# Patient Record
Sex: Male | Born: 1975 | Race: White | Hispanic: No | Smoking: Never smoker
Health system: Southern US, Community
[De-identification: ages and names within clinical notes are randomized; demographics above are authoritative.]

## PROBLEM LIST (undated history)

## (undated) DIAGNOSIS — J189 Pneumonia, unspecified organism: Secondary | ICD-10-CM

## (undated) DIAGNOSIS — M1990 Unspecified osteoarthritis, unspecified site: Secondary | ICD-10-CM

## (undated) DIAGNOSIS — M545 Low back pain, unspecified: Secondary | ICD-10-CM

## (undated) DIAGNOSIS — G4730 Sleep apnea, unspecified: Secondary | ICD-10-CM

## (undated) DIAGNOSIS — G43909 Migraine, unspecified, not intractable, without status migrainosus: Secondary | ICD-10-CM

## (undated) DIAGNOSIS — F419 Anxiety disorder, unspecified: Secondary | ICD-10-CM

## (undated) DIAGNOSIS — G8929 Other chronic pain: Secondary | ICD-10-CM

## (undated) DIAGNOSIS — Z800 Family history of malignant neoplasm of digestive organs: Secondary | ICD-10-CM

## (undated) DIAGNOSIS — J019 Acute sinusitis, unspecified: Secondary | ICD-10-CM

## (undated) DIAGNOSIS — F32A Depression, unspecified: Secondary | ICD-10-CM

## (undated) DIAGNOSIS — M87059 Idiopathic aseptic necrosis of unspecified femur: Secondary | ICD-10-CM

## (undated) DIAGNOSIS — F988 Other specified behavioral and emotional disorders with onset usually occurring in childhood and adolescence: Secondary | ICD-10-CM

## (undated) DIAGNOSIS — J40 Bronchitis, not specified as acute or chronic: Secondary | ICD-10-CM

## (undated) DIAGNOSIS — K859 Acute pancreatitis without necrosis or infection, unspecified: Secondary | ICD-10-CM

## (undated) DIAGNOSIS — Z8489 Family history of other specified conditions: Secondary | ICD-10-CM

## (undated) DIAGNOSIS — K760 Fatty (change of) liver, not elsewhere classified: Secondary | ICD-10-CM

## (undated) DIAGNOSIS — R51 Headache: Secondary | ICD-10-CM

## (undated) DIAGNOSIS — R0981 Nasal congestion: Secondary | ICD-10-CM

## (undated) DIAGNOSIS — R519 Headache, unspecified: Secondary | ICD-10-CM

## (undated) DIAGNOSIS — E039 Hypothyroidism, unspecified: Secondary | ICD-10-CM

## (undated) DIAGNOSIS — M5442 Lumbago with sciatica, left side: Secondary | ICD-10-CM

## (undated) DIAGNOSIS — R002 Palpitations: Secondary | ICD-10-CM

## (undated) DIAGNOSIS — E781 Pure hyperglyceridemia: Secondary | ICD-10-CM

## (undated) DIAGNOSIS — K921 Melena: Secondary | ICD-10-CM

## (undated) DIAGNOSIS — E785 Hyperlipidemia, unspecified: Secondary | ICD-10-CM

## (undated) DIAGNOSIS — R0683 Snoring: Secondary | ICD-10-CM

## (undated) DIAGNOSIS — F410 Panic disorder [episodic paroxysmal anxiety] without agoraphobia: Secondary | ICD-10-CM

## (undated) DIAGNOSIS — K219 Gastro-esophageal reflux disease without esophagitis: Secondary | ICD-10-CM

## (undated) DIAGNOSIS — M5441 Lumbago with sciatica, right side: Secondary | ICD-10-CM

## (undated) HISTORY — DX: Lumbago with sciatica, right side: M54.41

## (undated) HISTORY — DX: Palpitations: R00.2

## (undated) HISTORY — DX: Melena: K92.1

## (undated) HISTORY — DX: Depression, unspecified: F32.A

## (undated) HISTORY — DX: Acute sinusitis, unspecified: J01.90

## (undated) HISTORY — DX: Hypothyroidism, unspecified: E03.9

## (undated) HISTORY — DX: Fatty (change of) liver, not elsewhere classified: K76.0

## (undated) HISTORY — PX: TONSILLECTOMY AND ADENOIDECTOMY: SUR1326

## (undated) HISTORY — DX: Lumbago with sciatica, left side: M54.42

## (undated) HISTORY — DX: Anxiety disorder, unspecified: F41.9

## (undated) HISTORY — DX: Family history of malignant neoplasm of digestive organs: Z80.0

## (undated) HISTORY — DX: Acute pancreatitis without necrosis or infection, unspecified: K85.90

## (undated) HISTORY — DX: Snoring: R06.83

## (undated) HISTORY — DX: Hyperlipidemia, unspecified: E78.5

## (undated) HISTORY — DX: Bronchitis, not specified as acute or chronic: J40

## (undated) HISTORY — DX: Nasal congestion: R09.81

## (undated) HISTORY — DX: Other specified behavioral and emotional disorders with onset usually occurring in childhood and adolescence: F98.8

---

## 2002-04-24 ENCOUNTER — Emergency Department (HOSPITAL_COMMUNITY): Admission: EM | Admit: 2002-04-24 | Discharge: 2002-04-24 | Payer: Self-pay | Admitting: Emergency Medicine

## 2002-04-24 ENCOUNTER — Encounter: Payer: Self-pay | Admitting: Emergency Medicine

## 2006-04-17 ENCOUNTER — Ambulatory Visit (HOSPITAL_BASED_OUTPATIENT_CLINIC_OR_DEPARTMENT_OTHER): Admission: RE | Admit: 2006-04-17 | Discharge: 2006-04-17 | Payer: Self-pay | Admitting: Otolaryngology

## 2006-04-28 ENCOUNTER — Ambulatory Visit: Payer: Self-pay | Admitting: Internal Medicine

## 2016-04-06 ENCOUNTER — Inpatient Hospital Stay (HOSPITAL_COMMUNITY)
Admission: EM | Admit: 2016-04-06 | Discharge: 2016-04-10 | DRG: 440 | Disposition: A | Discharge status: Home / Self Care | Payer: BLUE CROSS/BLUE SHIELD | Attending: Internal Medicine | Admitting: Internal Medicine

## 2016-04-06 ENCOUNTER — Encounter (HOSPITAL_COMMUNITY): Payer: Self-pay | Admitting: *Deleted

## 2016-04-06 ENCOUNTER — Emergency Department (HOSPITAL_COMMUNITY): Payer: BLUE CROSS/BLUE SHIELD

## 2016-04-06 DIAGNOSIS — R059 Cough, unspecified: Secondary | ICD-10-CM | POA: Diagnosis present

## 2016-04-06 DIAGNOSIS — Z789 Other specified health status: Secondary | ICD-10-CM | POA: Diagnosis not present

## 2016-04-06 DIAGNOSIS — E781 Pure hyperglyceridemia: Secondary | ICD-10-CM | POA: Insufficient documentation

## 2016-04-06 DIAGNOSIS — F1090 Alcohol use, unspecified, uncomplicated: Secondary | ICD-10-CM | POA: Diagnosis present

## 2016-04-06 DIAGNOSIS — R112 Nausea with vomiting, unspecified: Secondary | ICD-10-CM | POA: Diagnosis not present

## 2016-04-06 DIAGNOSIS — K859 Acute pancreatitis without necrosis or infection, unspecified: Principal | ICD-10-CM | POA: Diagnosis present

## 2016-04-06 DIAGNOSIS — R05 Cough: Secondary | ICD-10-CM | POA: Diagnosis present

## 2016-04-06 DIAGNOSIS — K219 Gastro-esophageal reflux disease without esophagitis: Secondary | ICD-10-CM | POA: Diagnosis present

## 2016-04-06 DIAGNOSIS — Z79899 Other long term (current) drug therapy: Secondary | ICD-10-CM

## 2016-04-06 DIAGNOSIS — R7989 Other specified abnormal findings of blood chemistry: Secondary | ICD-10-CM | POA: Diagnosis present

## 2016-04-06 DIAGNOSIS — F410 Panic disorder [episodic paroxysmal anxiety] without agoraphobia: Secondary | ICD-10-CM | POA: Diagnosis not present

## 2016-04-06 DIAGNOSIS — Z7689 Persons encountering health services in other specified circumstances: Secondary | ICD-10-CM

## 2016-04-06 DIAGNOSIS — Z7289 Other problems related to lifestyle: Secondary | ICD-10-CM | POA: Diagnosis present

## 2016-04-06 DIAGNOSIS — F1010 Alcohol abuse, uncomplicated: Secondary | ICD-10-CM | POA: Diagnosis present

## 2016-04-06 DIAGNOSIS — R945 Abnormal results of liver function studies: Secondary | ICD-10-CM | POA: Diagnosis present

## 2016-04-06 HISTORY — DX: Acute pancreatitis without necrosis or infection, unspecified: K85.90

## 2016-04-06 HISTORY — DX: Low back pain: M54.5

## 2016-04-06 HISTORY — DX: Low back pain, unspecified: M54.50

## 2016-04-06 HISTORY — DX: Family history of other specified conditions: Z84.89

## 2016-04-06 HISTORY — DX: Panic disorder (episodic paroxysmal anxiety): F41.0

## 2016-04-06 HISTORY — DX: Gastro-esophageal reflux disease without esophagitis: K21.9

## 2016-04-06 HISTORY — DX: Headache, unspecified: R51.9

## 2016-04-06 HISTORY — DX: Pneumonia, unspecified organism: J18.9

## 2016-04-06 HISTORY — DX: Other chronic pain: G89.29

## 2016-04-06 HISTORY — DX: Pure hyperglyceridemia: E78.1

## 2016-04-06 HISTORY — DX: Headache: R51

## 2016-04-06 HISTORY — DX: Migraine, unspecified, not intractable, without status migrainosus: G43.909

## 2016-04-06 HISTORY — DX: Sleep apnea, unspecified: G47.30

## 2016-04-06 LAB — COMPREHENSIVE METABOLIC PANEL
ALBUMIN: 3.2 g/dL — AB (ref 3.5–5.0)
ALK PHOS: 216 U/L — AB (ref 38–126)
ALT: 157 U/L — ABNORMAL HIGH (ref 17–63)
ANION GAP: 11 (ref 5–15)
AST: 251 U/L — ABNORMAL HIGH (ref 15–41)
BILIRUBIN TOTAL: 1.7 mg/dL — AB (ref 0.3–1.2)
BUN: 11 mg/dL (ref 6–20)
CALCIUM: 8.9 mg/dL (ref 8.9–10.3)
CO2: 27 mmol/L (ref 22–32)
Chloride: 100 mmol/L — ABNORMAL LOW (ref 101–111)
Creatinine, Ser: 1.05 mg/dL (ref 0.61–1.24)
GFR calc Af Amer: >60 mL/min (ref >60)
GLUCOSE: 125 mg/dL — AB (ref 65–99)
POTASSIUM: 4 mmol/L (ref 3.5–5.1)
Sodium: 138 mmol/L (ref 135–145)
TOTAL PROTEIN: 6.6 g/dL (ref 6.5–8.1)

## 2016-04-06 LAB — URINALYSIS, ROUTINE W REFLEX MICROSCOPIC
BILIRUBIN URINE: NEGATIVE
Glucose, UA: NEGATIVE mg/dL
HGB URINE DIPSTICK: NEGATIVE
Ketones, ur: NEGATIVE mg/dL
LEUKOCYTES UA: NEGATIVE
NITRITE: NEGATIVE
PH: 7.5 (ref 5.0–8.0)
Protein, ur: NEGATIVE mg/dL
SPECIFIC GRAVITY, URINE: 1.018 (ref 1.005–1.030)

## 2016-04-06 LAB — CBC
HEMATOCRIT: 40 % (ref 39.0–52.0)
HEMOGLOBIN: 14.8 g/dL (ref 13.0–17.0)
MCH: 33.8 pg (ref 26.0–34.0)
MCHC: 37 g/dL — AB (ref 30.0–36.0)
MCV: 91.3 fL (ref 78.0–100.0)
Platelets: 270 10*3/uL (ref 150–400)
RBC: 4.38 MIL/uL (ref 4.22–5.81)
RDW: 14.8 % (ref 11.5–15.5)
WBC: 11.5 10*3/uL — AB (ref 4.0–10.5)

## 2016-04-06 LAB — LIPID PANEL
Cholesterol: 763 mg/dL — ABNORMAL HIGH (ref 0–200)
LDL CALC: UNDETERMINED mg/dL (ref 0–99)
Triglycerides: 7940 mg/dL — ABNORMAL HIGH (ref <150)
VLDL: UNDETERMINED mg/dL (ref 0–40)

## 2016-04-06 LAB — LIPASE, BLOOD: Lipase: 923 U/L — ABNORMAL HIGH (ref 11–51)

## 2016-04-06 MED ORDER — MORPHINE SULFATE (PF) 4 MG/ML IV SOLN
4.0000 mg | Freq: Once | INTRAVENOUS | Status: AC
  Administered 2016-04-06: 4 mg via INTRAVENOUS
  Filled 2016-04-06: qty 1

## 2016-04-06 MED ORDER — LORAZEPAM 2 MG/ML IJ SOLN
1.0000 mg | Freq: Four times a day (QID) | INTRAMUSCULAR | Status: AC | PRN
  Administered 2016-04-06 – 2016-04-09 (×4): 1 mg via INTRAVENOUS
  Filled 2016-04-06: qty 1

## 2016-04-06 MED ORDER — FOLIC ACID 1 MG PO TABS
1.0000 mg | ORAL_TABLET | Freq: Every day | ORAL | Status: DC
  Administered 2016-04-07 – 2016-04-10 (×4): 1 mg via ORAL
  Filled 2016-04-06 (×4): qty 1

## 2016-04-06 MED ORDER — PANTOPRAZOLE SODIUM 40 MG PO TBEC
40.0000 mg | DELAYED_RELEASE_TABLET | Freq: Every day | ORAL | Status: DC
  Administered 2016-04-07 – 2016-04-10 (×3): 40 mg via ORAL
  Filled 2016-04-06 (×3): qty 1

## 2016-04-06 MED ORDER — SODIUM CHLORIDE 0.9 % IV BOLUS (SEPSIS)
1000.0000 mL | Freq: Once | INTRAVENOUS | Status: DC
Start: 2016-04-06 — End: 2016-04-10

## 2016-04-06 MED ORDER — HYDROMORPHONE HCL 1 MG/ML IJ SOLN
1.0000 mg | Freq: Once | INTRAMUSCULAR | Status: AC
  Administered 2016-04-06: 1 mg via INTRAVENOUS
  Filled 2016-04-06: qty 1

## 2016-04-06 MED ORDER — SODIUM CHLORIDE 0.9 % IV SOLN: INTRAVENOUS | Status: DC

## 2016-04-06 MED ORDER — MORPHINE SULFATE (PF) 2 MG/ML IV SOLN
2.0000 mg | INTRAVENOUS | Status: DC | PRN
  Administered 2016-04-06 – 2016-04-07 (×3): 2 mg via INTRAVENOUS
  Filled 2016-04-06 (×3): qty 1

## 2016-04-06 MED ORDER — ONDANSETRON HCL 4 MG/2ML IJ SOLN
4.0000 mg | Freq: Three times a day (TID) | INTRAMUSCULAR | Status: AC | PRN
  Administered 2016-04-06 – 2016-04-07 (×2): 4 mg via INTRAVENOUS
  Filled 2016-04-06 (×2): qty 2

## 2016-04-06 MED ORDER — ONDANSETRON HCL 4 MG/2ML IJ SOLN
4.0000 mg | Freq: Once | INTRAMUSCULAR | Status: AC
  Administered 2016-04-06: 4 mg via INTRAVENOUS
  Filled 2016-04-06: qty 2

## 2016-04-06 MED ORDER — LORAZEPAM 2 MG/ML IJ SOLN
0.0000 mg | Freq: Four times a day (QID) | INTRAMUSCULAR | Status: AC
  Administered 2016-04-07 – 2016-04-08 (×2): 1 mg via INTRAVENOUS
  Filled 2016-04-06 (×3): qty 1

## 2016-04-06 MED ORDER — ENOXAPARIN SODIUM 40 MG/0.4ML ~~LOC~~ SOLN
40.0000 mg | SUBCUTANEOUS | Status: DC
  Filled 2016-04-06 (×2): qty 0.4

## 2016-04-06 MED ORDER — ADULT MULTIVITAMIN W/MINERALS CH
1.0000 | ORAL_TABLET | Freq: Every day | ORAL | Status: DC
  Administered 2016-04-07 – 2016-04-10 (×4): 1 via ORAL
  Filled 2016-04-06 (×4): qty 1

## 2016-04-06 MED ORDER — VITAMIN B-1 100 MG PO TABS
100.0000 mg | ORAL_TABLET | Freq: Every day | ORAL | Status: DC
  Administered 2016-04-07 – 2016-04-10 (×3): 100 mg via ORAL
  Filled 2016-04-06 (×3): qty 1

## 2016-04-06 MED ORDER — ALBUTEROL SULFATE (2.5 MG/3ML) 0.083% IN NEBU: 2.5000 mg | INHALATION_SOLUTION | Freq: Four times a day (QID) | RESPIRATORY_TRACT | Status: DC | PRN

## 2016-04-06 MED ORDER — LORAZEPAM 1 MG PO TABS
1.0000 mg | ORAL_TABLET | Freq: Four times a day (QID) | ORAL | Status: AC | PRN
  Filled 2016-04-06: qty 1

## 2016-04-06 MED ORDER — ONDANSETRON 4 MG PO TBDP
ORAL_TABLET | ORAL | Status: AC
  Filled 2016-04-06: qty 1

## 2016-04-06 MED ORDER — SODIUM CHLORIDE 0.9 % IV BOLUS (SEPSIS)
1000.0000 mL | Freq: Once | INTRAVENOUS | Status: AC
Start: 2016-04-06 — End: 2016-04-06
  Administered 2016-04-06: 1000 mL via INTRAVENOUS

## 2016-04-06 MED ORDER — HYDROMORPHONE HCL 1 MG/ML IJ SOLN: 1.0000 mg | INTRAMUSCULAR | Status: DC | PRN

## 2016-04-06 MED ORDER — LORAZEPAM 2 MG/ML IJ SOLN
0.0000 mg | Freq: Two times a day (BID) | INTRAMUSCULAR | Status: DC
Start: 2016-04-08 — End: 2016-04-10
  Administered 2016-04-08: 1 mg via INTRAVENOUS
  Filled 2016-04-06: qty 1

## 2016-04-06 MED ORDER — ONDANSETRON 4 MG PO TBDP
4.0000 mg | ORAL_TABLET | Freq: Once | ORAL | Status: AC | PRN
  Administered 2016-04-06: 4 mg via ORAL

## 2016-04-06 MED ORDER — OXYCODONE HCL 5 MG PO TABS
10.0000 mg | ORAL_TABLET | Freq: Four times a day (QID) | ORAL | Status: DC | PRN
  Administered 2016-04-07 – 2016-04-08 (×4): 10 mg via ORAL
  Filled 2016-04-06 (×4): qty 2

## 2016-04-06 MED ORDER — ALPRAZOLAM 0.25 MG PO TABS: 0.2500 mg | ORAL_TABLET | Freq: Every day | ORAL | Status: DC | PRN

## 2016-04-06 MED ORDER — LORATADINE 10 MG PO TABS
10.0000 mg | ORAL_TABLET | Freq: Every day | ORAL | Status: DC
Start: 2016-04-06 — End: 2016-04-10
  Administered 2016-04-07 – 2016-04-10 (×3): 10 mg via ORAL
  Filled 2016-04-06 (×4): qty 1

## 2016-04-06 MED ORDER — THIAMINE HCL 100 MG/ML IJ SOLN: 100.0000 mg | Freq: Every day | INTRAMUSCULAR | Status: DC

## 2016-04-06 MED ORDER — DM-GUAIFENESIN ER 30-600 MG PO TB12
1.0000 | ORAL_TABLET | Freq: Two times a day (BID) | ORAL | Status: DC
  Administered 2016-04-07 – 2016-04-10 (×7): 1 via ORAL
  Filled 2016-04-06 (×7): qty 1

## 2016-04-06 MED ORDER — AMPHETAMINE-DEXTROAMPHETAMINE 10 MG PO TABS
20.0000 mg | ORAL_TABLET | Freq: Three times a day (TID) | ORAL | Status: DC
  Administered 2016-04-07 – 2016-04-10 (×2): 20 mg via ORAL
  Filled 2016-04-06 (×4): qty 2

## 2016-04-06 NOTE — H&P (Addendum)
History and Physical    Connor LaurenceGarrett B Acevedo QIO:[id number] DOB: 1976/03/21 DOA: 04/06/2016  Referring MD/NP/PA:   PCP: Pcp Not In System   Patient coming from:  The patient is coming from home.  At baseline, pt is independent for most of ADL.        Chief Complaint: Nausea, vomiting, abdominal pain, cough  HPI: Connor Martin is a 40 y.o. male with medical history significant of GERD, panic attack, alcohol use, who presents with nausea, vomiting, abdominal pain and cough.  Patient reports that he has been having nausea, vomiting, abdominal pain for more than a week. His abdominal pain is located in the left upper quadrant, intermittent, 10 out of 10 in severity, nonradiating. It is not aggravated or alleviated by any known factors. He vomited 4-5 times today without blood in the vomitus. Patient states that he drinks Vodka 3-4 times per week. Per his girlfriend, patient has several episode of panic attack with headache, generalized weakness, anxiety. He had some chest pain and chest discomfort earlier, which has resolved. Currently no chest pain. Patient has dry cough for more than 2 weeks, no SOB.  Patient does not have fever, but has chills. No diarrhea, symptoms of UTI or unilateral weakness.  ED Course: pt was found to have elevated lipase 923, WBC 11.5, abnormal liver function with total bilirubin 1.7, AST 251, ALT 157, ALP 216, temperature normal, heart rate at 90s, electrolytes and renal function okay. Abdominal ultrasound showed no cholelithiasis or sonographic evidence of acute cholecystitis, increased hepatic echogenicity as can be seen with hepatic steatosis. Pt is placed on med-surg bed for obs.  Review of Systems:   General: no fevers, has chills, no changes in body weight, has poor appetite, has fatigue HEENT: no blurry vision, hearing changes or sore throat Pulm: no dyspnea, has coughing, no wheezing CV: had chest pain, no palpitations Abd: has nausea, vomiting, abdominal  pain, no diarrhea, constipation GU: no dysuria, burning on urination, increased urinary frequency, hematuria  Ext: no leg edema Neuro: no unilateral weakness, numbness, or tingling, no vision change or hearing loss Skin: no rash MSK: No muscle spasm, no deformity, no limitation of range of movement in spin Heme: No easy bruising.  Travel history: No recent long distant travel.  Allergy:  Allergies  Allergen Reactions  . Imitrex [Sumatriptan] Swelling    Brain  . Latex Other (See Comments)    Does not prefer  . Benadryl [Diphenhydramine] Hives and Palpitations    unsure    Past Medical History  Diagnosis Date  . Panic attacks   . GERD (gastroesophageal reflux disease)     Past Surgical History  Procedure Laterality Date  . Tonsillectomy      Social History:  reports that he has never smoked. He does not have any smokeless tobacco history on file. He reports that he drinks alcohol. He reports that he does not use illicit drugs.  Family History:  Family History  Problem Relation Age of Onset  . Hypothyroidism Mother   . Heart disease Mother   . ALS Father      Prior to Admission medications   Medication Sig Start Date End Date Taking? Authorizing Provider  ALPRAZolam Prudy Feeler(XANAX) 0.5 MG tablet Take 0.25 mg by mouth daily as needed for anxiety.   Yes Historical Provider, MD  amphetamine-dextroamphetamine (ADDERALL) 20 MG tablet Take 20 mg by mouth 3 (three) times daily. 02/04/16  Yes Historical Provider, MD  aspirin-acetaminophen-caffeine (EXCEDRIN MIGRAINE) [phone] MG tablet Take 1  tablet by mouth 2 (two) times daily as needed for migraine.    Yes Historical Provider, MD  cetirizine (ZYRTEC) 10 MG tablet Take 10 mg by mouth daily.   Yes Historical Provider, MD  esomeprazole (NEXIUM) 40 MG capsule Take 40 mg by mouth daily. 04/04/16  Yes Historical Provider, MD    Physical Exam: Filed Vitals:   04/06/16 1535 04/06/16 1738 04/06/16 1900 04/06/16 2100  BP: 165/90 137/89  125/90 136/93  Pulse: 99 93 96 98  Temp: 98.7 F (37.1 C) 98.9 F (37.2 C)    TempSrc: Oral Oral    Resp: 24 20 23 16   SpO2: 100% 97% 100% 97%   General: Not in acute distress. Pt is anxious. HEENT:       Eyes: PERRL, EOMI, no scleral icterus.       ENT: No discharge from the ears and nose, no pharynx injection, no tonsillar enlargement.        Neck: No JVD, no bruit, no mass felt. Heme: No neck lymph node enlargement. Cardiac: S1/S2, RRR, No murmurs, No gallops or rubs. Pulm: No rales, wheezing, rhonchi or rubs. Abd: Soft, nondistended, tenderness over LUQ, no rebound pain, no organomegaly, BS present. GU: No hematuria Ext: No pitting leg edema bilaterally. 2+DP/PT pulse bilaterally. Musculoskeletal: No joint deformities, No joint redness or warmth, no limitation of ROM in spin. Skin: No rashes.  Neuro: Alert, oriented X3, cranial nerves II-XII grossly intact, moves all extremities normally.  Psych: Patient is not psychotic, no suicidal or hemocidal ideation.  Labs on Admission: I have personally reviewed following labs and imaging studies  CBC:  Recent Labs Lab 04/06/16 1619  WBC 11.5*  HGB 14.8  HCT 40.0  MCV 91.3  PLT 270   Basic Metabolic Panel:  Recent Labs Lab 04/06/16 1619  NA 138  K 4.0  CL 100*  CO2 27  GLUCOSE 125*  BUN 11  CREATININE 1.05  CALCIUM 8.9   GFR: CrCl cannot be calculated (Unknown ideal weight.). Liver Function Tests:  Recent Labs Lab 04/06/16 1619  AST 251*  ALT 157*  ALKPHOS 216*  BILITOT 1.7*  PROT 6.6  ALBUMIN 3.2*    Recent Labs Lab 04/06/16 1619  LIPASE 923*   No results for input(s): AMMONIA in the last 168 hours. Coagulation Profile: No results for input(s): INR, PROTIME in the last 168 hours. Cardiac Enzymes: No results for input(s): CKTOTAL, CKMB, CKMBINDEX, TROPONINI in the last 168 hours. BNP (last 3 results) No results for input(s): PROBNP in the last 8760 hours. HbA1C: No results for input(s): HGBA1C  in the last 72 hours. CBG: No results for input(s): GLUCAP in the last 168 hours. Lipid Profile: No results for input(s): CHOL, HDL, LDLCALC, TRIG, CHOLHDL, LDLDIRECT in the last 72 hours. Thyroid Function Tests: No results for input(s): TSH, T4TOTAL, FREET4, T3FREE, THYROIDAB in the last 72 hours. Anemia Panel: No results for input(s): VITAMINB12, FOLATE, FERRITIN, TIBC, IRON, RETICCTPCT in the last 72 hours. Urine analysis:    Component Value Date/Time   COLORURINE YELLOW 04/06/2016 1900   APPEARANCEUR CLEAR 04/06/2016 1900   LABSPEC 1.018 04/06/2016 1900   PHURINE 7.5 04/06/2016 1900   GLUCOSEU NEGATIVE 04/06/2016 1900   HGBUR NEGATIVE 04/06/2016 1900   BILIRUBINUR NEGATIVE 04/06/2016 1900   KETONESUR NEGATIVE 04/06/2016 1900   PROTEINUR NEGATIVE 04/06/2016 1900   NITRITE NEGATIVE 04/06/2016 1900   LEUKOCYTESUR NEGATIVE 04/06/2016 1900   Sepsis Labs: @LABRCNTIP (procalcitonin:4,lacticidven:4) )No results found for this or any previous visit (from the past  240 hour(s)).   Radiological Exams on Admission: US Abdomen Limited  04/06/2016  CLINICAL DATA:  Panic attack, chest pain, nausea, vomiting, increased LFTs EXAM: US ABDOMEN LIMITED - RIGHT UPPER QUADRANT COMPARISON:  None. FINDINGS: Gallbladder: No gallstones or wall thickening visualized. No sonographic Murphy sign noted by sonographer. Common bile duct: Diameter: 4 mm Liver: No focal lesion identified. Increased hepatic parenchymal echogenicity. IMPRESSION: 1. No cholelithiasis or sonographic evidence of acute cholecystitis. 2. Increased hepatic echogenicity as can be seen with hepatic steatosis. Electronically Signed   By: Elige Ko   On: 04/06/2016 20:43     EKG: Independently reviewed. Sinus rhythm, QTC 432, no ischemic change.  Assessment/Plan Principal Problem:   Pancreatitis, acute Active Problems:   Panic attacks   GERD (gastroesophageal reflux disease)   Abnormal LFTs   Alcohol use (HCC)    Cough   Pancreatitis, acute: Patient's nausea, vomiting and abdominal pain is most likely caused by acute pancreatitis given elevated lipase. Patient has abnormal liver function, but no gallstone by abdominal ultrasound. This is most likely due to alcoholic pancreatitis. Currently patient is hemodynamically stable.  -will place on med-surg bed for observation -NPO for pancreatitis -IVF: 2LNS and then at 125 cc/hr -IV morphine and oxycodone for pain control, IV zofran for nausea -check lipid panel to rule out triglyceridemia.  Addendum: Lipid profile result showed elevation of triglycerides 7940, which may have contributed to pancreatitis. - change bed to tele due to need of insulin gtt -will start insulin gtt now -change IVF from NS to D10 at 100 cc/h -cbg q1h -BMP q4h -D50 prn for hypoglycemia -repeat lipid profile in morning   GERD (gastroesophageal reflux disease): -continue PPI, protonix  Panic attacks: -continue home Xanax -also on CIWA protocol for alcohol use  Abnormal LFTs: likely due to alcohol use. -hold Excedrin (to avoid tylenol) -check hepatitis panel  Alcohol use: -Did counseling about the importance of quitting drinking -CIWA protocol  Cough: Lung clear.  -check CXR -Mucinex for cough -prn Albuterol Nebs for SOB   DVT ppx: sq Lovenox Code Status: Full code Family Communication: Yes, patient's mother and girlfriend at bed side Disposition Plan:  Anticipate discharge back to previous home environment Consults called: none Admission status: medical floor/obs   Date of Service 04/06/2016    Lorretta Harp [facility] Pager [phone]  If 7PM-7AM, please contact night-coverage [email or web address] Password [facility] 04/06/2016, 9:43 PM

## 2016-04-06 NOTE — ED Notes (Addendum)
pts girlfriend states that pt has been having what they believe to be panic attacks. States that he has been experiencing emesis, headache, weakness. C/o chest pain when he is having severe anxiety. States he takes .25mg  Xanax. States he has been having panic attacks since college but has not had any for awhile. 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

## 2016-04-06 NOTE — ED Provider Notes (Addendum)
CSN: [id number]     Arrival date & time 04/06/16  1516 History   First MD Initiated Contact with Patient 04/06/16 1851     Chief Complaint  Patient presents with  . Emesis     (Consider location/radiation/quality/duration/timing/severity/associated sxs/prior Treatment) HPI Comments: Patient states for the last week he has not felt well. He has had recurrent episodes of vomiting some days worse than others and left upper quadrant pain. Sometimes he will vomit wrist times and others just once or twice. Occasionally he will have blood streaks in his vomitus but not continuously. Symptoms started spontaneously however he did have more to drink on Saturday because he was on medication which seemed to make his symptoms worse. He typically drinks 5 drinks per week. Also after numerous episodes of vomiting he will have a short episode of syncope that his wife for mom have to wake him up from. Today he has not been able to hold down any fluids or food. The pain is not improving and because he was having worsening symptoms he decided to come to be evaluated. He states he is always under a lot of stress but seems to be more anxious than normal. He does have Xanax that he can take for anxiety but hadn't taken any for 3 months until this week. No prior history of gallbladder disease.  Patient is a 40 y.o. male presenting with vomiting. The history is provided by the patient.  Emesis Severity:  Severe Duration:  1 week Timing:  Constant Number of daily episodes:  Multiple Quality:  Stomach contents (Occasionally will have streaks of blood in his vomitus) Progression:  Unchanged Chronicity:  New Recent urination:  Decreased Relieved by:  Nothing Worsened by:  Liquids and food smell Ineffective treatments:  None tried Associated symptoms: abdominal pain and headaches   Associated symptoms: no diarrhea   Associated symptoms comment:  Left upper abdominal pain Risk factors: alcohol use   Risk factors: no  diabetes, no sick contacts and no suspect food intake     Past Medical History  Diagnosis Date  . Panic attacks   . GERD (gastroesophageal reflux disease)    Past Surgical History  Procedure Laterality Date  . Tonsillectomy     No family history on file. Social History  Substance Use Topics  . Smoking status: Never Smoker   . Smokeless tobacco: None  . Alcohol Use: Yes     Comment: social    Review of Systems  Gastrointestinal: Positive for vomiting and abdominal pain. Negative for diarrhea.  Neurological: Positive for headaches.  All other systems reviewed and are negative.     Allergies  Benadryl  Home Medications   Prior to Admission medications   Not on File   BP 125/90 mmHg  Pulse 96  Temp(Src) 98.9 F (37.2 C) (Oral)  Resp 23  SpO2 100% Physical Exam  Constitutional: He is oriented to person, place, and time. He appears well-developed and well-nourished. No distress.  HENT:  Head: Normocephalic and atraumatic.  Mouth/Throat: Oropharynx is clear and moist. Mucous membranes are dry.  Eyes: Conjunctivae and EOM are normal. Pupils are equal, round, and reactive to light.  Neck: Normal range of motion. Neck supple.  Cardiovascular: Normal rate, regular rhythm and intact distal pulses.   No murmur heard. Pulmonary/Chest: Effort normal and breath sounds normal. No respiratory distress. He has no wheezes. He has no rales.  Abdominal: Soft. He exhibits no distension. There is tenderness in the left upper quadrant. There is  no rebound and no guarding.  Musculoskeletal: Normal range of motion. He exhibits no edema or tenderness.  Neurological: He is alert and oriented to person, place, and time.  Skin: Skin is warm and dry. No rash noted. No erythema.  Psychiatric: He has a normal mood and affect. His behavior is normal.  Nursing note and vitals reviewed.   ED Course  Procedures (including critical care time) Labs Review Labs Reviewed  LIPASE, BLOOD -  Abnormal; Notable for the following:    Lipase 923 (*)    All other components within normal limits  COMPREHENSIVE METABOLIC PANEL - Abnormal; Notable for the following:    Chloride 100 (*)    Glucose, Bld 125 (*)    Albumin 3.2 (*)    AST 251 (*)    ALT 157 (*)    Alkaline Phosphatase 216 (*)    Total Bilirubin 1.7 (*)    All other components within normal limits  CBC - Abnormal; Notable for the following:    WBC 11.5 (*)    MCHC 37.0 (*)    All other components within normal limits  URINALYSIS, ROUTINE W REFLEX MICROSCOPIC (NOT AT [facility])    Imaging Review [place] Abdomen Limited  04/06/2016  CLINICAL DATA:  Panic attack, chest pain, nausea, vomiting, increased LFTs EXAM: US ABDOMEN LIMITED - RIGHT UPPER QUADRANT COMPARISON:  None. FINDINGS: Gallbladder: No gallstones or wall thickening visualized. No sonographic Murphy sign noted by sonographer. Common bile duct: Diameter: 4 mm Liver: No focal lesion identified. Increased hepatic parenchymal echogenicity. IMPRESSION: 1. No cholelithiasis or sonographic evidence of acute cholecystitis. 2. Increased hepatic echogenicity as can be seen with hepatic steatosis. Electronically Signed   By: Elige KoHetal  Patel   On: 04/06/2016 20:43   I have personally reviewed and evaluated these images and lab results as part of my medical decision-making.   EKG Interpretation   Date/Time:  Thursday April 06 2016 15:39:25 EDT Ventricular Rate:  99 PR Interval:  132 QRS Duration: 86 QT Interval:  340 QTC Calculation: 436 R Axis:   44 Text Interpretation:  Normal sinus rhythm Normal ECG No previous tracing  Confirmed by Anitra LauthPLUNKETT  MD, Alphonzo LemmingsWHITNEY ([id number]) on 04/06/2016 6:52:16 PM      MDM   Final diagnoses:  Acute pancreatitis, unspecified pancreatitis type   Patient is a 40 year old male presenting today with one week of vomiting, abdominal pain, intermittent syncope. He initially thought that it was related to severe anxiety however symptoms are not improving.  He also has significant left abdominal tenderness. Today patient is found to have pancreatitis with an elevated lipase of 950 as well as elevated LFTs of 157 and 251 (AST/ALT).  Patient denies any right upper quadrant tenderness and no prior history of pancreatitis. He does not take excessive amounts of Tylenol, ibuprofen or aspirin. He does drink 5-6 drinks per week. On Saturday he did have more alcohol than normal because he was at the beach on vacation.   He denies any fever or change in bowel habits.  Vital signs are stable. Right upper quadrant ultrasound ordered as well as IV fluids, pain and nausea medicine. Patient will require admission for pancreatitis.   8:52 PM U/s without gallbladder pathology.  Gwyneth SproutWhitney Karlis Cregg, MD 04/06/16 [id number]  Gwyneth SproutWhitney Jehan Ranganathan, MD 04/06/16 2112

## 2016-04-07 ENCOUNTER — Encounter (HOSPITAL_COMMUNITY): Payer: Self-pay | Admitting: General Practice

## 2016-04-07 ENCOUNTER — Observation Stay (HOSPITAL_COMMUNITY): Payer: BLUE CROSS/BLUE SHIELD

## 2016-04-07 DIAGNOSIS — K859 Acute pancreatitis without necrosis or infection, unspecified: Secondary | ICD-10-CM | POA: Diagnosis present

## 2016-04-07 DIAGNOSIS — Z789 Other specified health status: Secondary | ICD-10-CM | POA: Diagnosis not present

## 2016-04-07 DIAGNOSIS — R05 Cough: Secondary | ICD-10-CM | POA: Diagnosis present

## 2016-04-07 DIAGNOSIS — F410 Panic disorder [episodic paroxysmal anxiety] without agoraphobia: Secondary | ICD-10-CM | POA: Diagnosis present

## 2016-04-07 DIAGNOSIS — K858 Other acute pancreatitis without necrosis or infection: Secondary | ICD-10-CM

## 2016-04-07 DIAGNOSIS — F1010 Alcohol abuse, uncomplicated: Secondary | ICD-10-CM | POA: Diagnosis present

## 2016-04-07 DIAGNOSIS — R7989 Other specified abnormal findings of blood chemistry: Secondary | ICD-10-CM | POA: Diagnosis not present

## 2016-04-07 DIAGNOSIS — R112 Nausea with vomiting, unspecified: Secondary | ICD-10-CM | POA: Diagnosis present

## 2016-04-07 DIAGNOSIS — E781 Pure hyperglyceridemia: Secondary | ICD-10-CM | POA: Diagnosis present

## 2016-04-07 DIAGNOSIS — K219 Gastro-esophageal reflux disease without esophagitis: Secondary | ICD-10-CM | POA: Diagnosis present

## 2016-04-07 DIAGNOSIS — Z79899 Other long term (current) drug therapy: Secondary | ICD-10-CM | POA: Diagnosis not present

## 2016-04-07 LAB — TRIGLYCERIDES: Triglycerides: >5000 mg/dL — ABNORMAL HIGH (ref <150)

## 2016-04-07 LAB — COMPREHENSIVE METABOLIC PANEL
ALT: 128 U/L — AB (ref 17–63)
ANION GAP: 9 (ref 5–15)
AST: 196 U/L — ABNORMAL HIGH (ref 15–41)
Albumin: 2.9 g/dL — ABNORMAL LOW (ref 3.5–5.0)
Alkaline Phosphatase: 190 U/L — ABNORMAL HIGH (ref 38–126)
BUN: 13 mg/dL (ref 6–20)
CO2: 25 mmol/L (ref 22–32)
CREATININE: 1 mg/dL (ref 0.61–1.24)
Calcium: 7.7 mg/dL — ABNORMAL LOW (ref 8.9–10.3)
Chloride: 101 mmol/L (ref 101–111)
Glucose, Bld: 84 mg/dL (ref 65–99)
POTASSIUM: 3.6 mmol/L (ref 3.5–5.1)
SODIUM: 135 mmol/L (ref 135–145)
Total Bilirubin: 1.8 mg/dL — ABNORMAL HIGH (ref 0.3–1.2)
Total Protein: 5.9 g/dL — ABNORMAL LOW (ref 6.5–8.1)

## 2016-04-07 LAB — LIPID PANEL
Cholesterol: 777 mg/dL — ABNORMAL HIGH (ref 0–200)
LDL CALC: UNDETERMINED mg/dL (ref 0–99)
TRIGLYCERIDES: 7630 mg/dL — AB (ref <150)
VLDL: UNDETERMINED mg/dL (ref 0–40)

## 2016-04-07 LAB — CBG MONITORING, ED
Glucose-Capillary: 84 mg/dL (ref 65–99)
Glucose-Capillary: 173 mg/dL — ABNORMAL HIGH (ref 65–99)

## 2016-04-07 LAB — GLUCOSE, CAPILLARY
GLUCOSE-CAPILLARY: 117 mg/dL — AB (ref 65–99)
GLUCOSE-CAPILLARY: 150 mg/dL — AB (ref 65–99)
Glucose-Capillary: 93 mg/dL (ref 65–99)
Glucose-Capillary: 81 mg/dL (ref 65–99)
Glucose-Capillary: 95 mg/dL (ref 65–99)
Glucose-Capillary: 113 mg/dL — ABNORMAL HIGH (ref 65–99)
Glucose-Capillary: 124 mg/dL — ABNORMAL HIGH (ref 65–99)
Glucose-Capillary: 121 mg/dL — ABNORMAL HIGH (ref 65–99)

## 2016-04-07 LAB — BASIC METABOLIC PANEL
ANION GAP: 13 (ref 5–15)
ANION GAP: 12 (ref 5–15)
BUN: 12 mg/dL (ref 6–20)
BUN: 12 mg/dL (ref 6–20)
CHLORIDE: 97 mmol/L — AB (ref 101–111)
CHLORIDE: 98 mmol/L — AB (ref 101–111)
CO2: 23 mmol/L (ref 22–32)
CO2: 24 mmol/L (ref 22–32)
CREATININE: 1.06 mg/dL (ref 0.61–1.24)
Calcium: 7.8 mg/dL — ABNORMAL LOW (ref 8.9–10.3)
Calcium: 7.8 mg/dL — ABNORMAL LOW (ref 8.9–10.3)
Creatinine, Ser: 1.04 mg/dL (ref 0.61–1.24)
GFR calc non Af Amer: >60 mL/min (ref >60)
GFR calc non Af Amer: >60 mL/min (ref >60)
Glucose, Bld: 111 mg/dL — ABNORMAL HIGH (ref 65–99)
Glucose, Bld: 124 mg/dL — ABNORMAL HIGH (ref 65–99)
POTASSIUM: 3.7 mmol/L (ref 3.5–5.1)
Potassium: 3.4 mmol/L — ABNORMAL LOW (ref 3.5–5.1)
Sodium: 133 mmol/L — ABNORMAL LOW (ref 135–145)
Sodium: 134 mmol/L — ABNORMAL LOW (ref 135–145)

## 2016-04-07 LAB — CBC
HCT: 42.3 % (ref 39.0–52.0)
Hemoglobin: 13.8 g/dL (ref 13.0–17.0)
MCH: 31.5 pg (ref 26.0–34.0)
MCHC: 32.6 g/dL (ref 30.0–36.0)
MCV: 96.6 fL (ref 78.0–100.0)
PLATELETS: 162 10*3/uL (ref 150–400)
RBC: 4.38 MIL/uL (ref 4.22–5.81)
RDW: 14.9 % (ref 11.5–15.5)
WBC: 10.2 10*3/uL (ref 4.0–10.5)

## 2016-04-07 MED ORDER — DEXTROSE 50 % IV SOLN: 25.0000 mL | INTRAVENOUS | Status: DC | PRN

## 2016-04-07 MED ORDER — HYDROMORPHONE HCL 1 MG/ML IJ SOLN
1.0000 mg | Freq: Once | INTRAMUSCULAR | Status: AC
  Administered 2016-04-07: 1 mg via INTRAVENOUS
  Filled 2016-04-07: qty 1

## 2016-04-07 MED ORDER — DEXTROSE 50 % IV SOLN: 50.0000 mL | INTRAVENOUS | Status: DC | PRN

## 2016-04-07 MED ORDER — SODIUM CHLORIDE 0.9 % IV SOLN
INTRAVENOUS | Status: DC
  Administered 2016-04-07 – 2016-04-10 (×5): via INTRAVENOUS

## 2016-04-07 MED ORDER — CALCIUM GLUCONATE 10 % IV SOLN
2.0000 g | Freq: Once | INTRAVENOUS | Status: DC
Start: 2016-04-07 — End: 2016-04-07
  Filled 2016-04-07: qty 20

## 2016-04-07 MED ORDER — DEXTROSE-NACL 5-0.45 % IV SOLN
INTRAVENOUS | Status: DC
  Administered 2016-04-07: 125 mL/h via INTRAVENOUS

## 2016-04-07 MED ORDER — ACETAMINOPHEN 325 MG PO TABS: 650.0000 mg | ORAL_TABLET | ORAL | Status: DC | PRN

## 2016-04-07 MED ORDER — CALCIUM GLUCONATE 10 % IV SOLN
4.0000 g | Freq: Once | INTRAVENOUS | Status: AC
  Filled 2016-04-07 (×2): qty 40

## 2016-04-07 MED ORDER — DEXTROSE 10 % IV SOLN
INTRAVENOUS | Status: DC
  Administered 2016-04-07: 07:00:00 via INTRAVENOUS

## 2016-04-07 MED ORDER — MORPHINE SULFATE (PF) 4 MG/ML IV SOLN
4.0000 mg | INTRAVENOUS | Status: DC | PRN
  Administered 2016-04-07 – 2016-04-09 (×10): 4 mg via INTRAVENOUS
  Filled 2016-04-07 (×11): qty 1

## 2016-04-07 MED ORDER — SODIUM CHLORIDE 0.9 % IV SOLN
INTRAVENOUS | Status: DC
  Filled 2016-04-07: qty 2.5

## 2016-04-07 MED ORDER — SODIUM CHLORIDE 0.9 % IV SOLN
Freq: Once | INTRAVENOUS | Status: DC
  Filled 2016-04-07 (×2): qty 200

## 2016-04-07 MED ORDER — DEXTROSE 50 % IV SOLN
INTRAVENOUS | Status: AC
  Filled 2016-04-07: qty 50

## 2016-04-07 MED ORDER — ACD FORMULA A 0.73-2.45-2.2 GM/100ML VI SOLN
500.0000 mL | Status: DC
  Filled 2016-04-07 (×2): qty 500

## 2016-04-07 MED ORDER — HEPARIN SODIUM (PORCINE) 1000 UNIT/ML IJ SOLN
1000.0000 [IU] | Freq: Once | INTRAMUSCULAR | Status: DC
  Filled 2016-04-07: qty 1

## 2016-04-07 MED ORDER — OMEGA-3-ACID ETHYL ESTERS 1 G PO CAPS
1.0000 g | ORAL_CAPSULE | Freq: Two times a day (BID) | ORAL | Status: DC
  Administered 2016-04-07 – 2016-04-10 (×4): 1 g via ORAL
  Filled 2016-04-07 (×4): qty 1

## 2016-04-07 MED ORDER — DEXTROSE 50 % IV SOLN
50.0000 mL | Freq: Once | INTRAVENOUS | Status: AC
  Administered 2016-04-07: 50 mL via INTRAVENOUS

## 2016-04-07 MED ORDER — ONDANSETRON HCL 4 MG/2ML IJ SOLN
4.0000 mg | Freq: Four times a day (QID) | INTRAMUSCULAR | Status: DC | PRN
  Administered 2016-04-07: 4 mg via INTRAVENOUS
  Filled 2016-04-07: qty 2

## 2016-04-07 MED ORDER — GEMFIBROZIL 600 MG PO TABS
600.0000 mg | ORAL_TABLET | Freq: Two times a day (BID) | ORAL | Status: DC
  Administered 2016-04-07 – 2016-04-10 (×5): 600 mg via ORAL
  Filled 2016-04-07 (×7): qty 1

## 2016-04-07 NOTE — Plan of Care (Signed)
Problem: Education: Goal: Knowledge of [facility] Education information/materials will improve Outcome: Progressing Pt educated on new medication. Consult to dietitian ordered for pt. Pt pain constant approx 7/10 all day in mid upper quad. Medicated with 4 mg of morphine Q3 PRN and Roxy 10 mg Q6, at most pain goes down to a five. Family at bedside, informed pt experienced some nausea around 5:15, 4 mg of morphine given to pt. Symptoms relieved. Dr. Gwenlyn PerkingMadera made aware. Will continue to monitor. I called IR to inquire about central line for plasmapherisis, was told it would happen some time tomorrow due to emergency.

## 2016-04-07 NOTE — ED Notes (Signed)
Pt CBG 84 at this time.

## 2016-04-07 NOTE — ED Notes (Signed)
Report attempted 

## 2016-04-07 NOTE — ED Notes (Signed)
CBG 173 at this time.

## 2016-04-07 NOTE — Progress Notes (Addendum)
Nutrition Consult/Brief Note  RD consulted for diet education regarding lowering LDL, cholesterol.  Patient not feeling well upon RD visit >> handouts "Fat Restricted Nutrition Therapy" provided to significant other.  Wt Readings from Last 15 Encounters:  04/07/16 205 lb (92.987 kg)    Body mass index is 26.31 kg/(m^2). Patient meets criteria for Overweight based on current BMI.   Current diet order is NPO. Labs and medications reviewed. RD to attempt to educate patient at later date.   Maureen ChattersKatie Kynzley Dowson, RD, LDN Pager #: [phone] After-Hours Pager #: [phone]

## 2016-04-07 NOTE — Consult Note (Signed)
Reason for Consult: evaluation for possible plasmaphersis for hyeprtriglyceridemia-induced acute pancreatitis. Referring Physician: Regan RakersMadera  Connor Martin is an 40 y.o. male.  HPI: Pt is a 39yo WM with PMH sig for GERD, panic attacks, and a family history of hypertriglyceridemia (on mother's side) who [facility] day h/o worsening abdominal pain associated with N/V.  Workup revealed a markedly elevated lipase at 932 and triglyceride levels of 7900.  He was started on oral statin therapy and his repeat triglyceride level was 7600.  We were asked to evaluate patient for possible plasmapheresis for hypertriglyceridemia-induced acute pancreatitis.  Trend in Creatinine: CREATININE, SER  Date/Time Value Ref Range Status  04/07/2016 12:15 PM 1.04 0.61 - 1.24 mg/dL Final  16/10/960407/14/2017 54:0908:50 AM 1.06 0.61 - 1.24 mg/dL Final  81/19/147807/14/2017 29:5604:35 AM 1.00 0.61 - 1.24 mg/dL Final  21/30/865707/13/2017 84:6904:19 PM 1.05 0.61 - 1.24 mg/dL Final    PMH:   Past Medical History  Diagnosis Date  . Panic attacks   . GERD (gastroesophageal reflux disease)     PSH:   Past Surgical History  Procedure Laterality Date  . Tonsillectomy      Allergies:  Allergies  Allergen Reactions  . Imitrex [Sumatriptan] Swelling    Brain  . Latex Other (See Comments)    Does not prefer  . Benadryl [Diphenhydramine] Hives and Palpitations    unsure    Medications:   Prior to Admission medications   Medication Sig Start Date End Date Taking? Authorizing Provider  ALPRAZolam Prudy Feeler(XANAX) 0.5 MG tablet Take 0.25 mg by mouth daily as needed for anxiety.   Yes Historical Provider, MD  amphetamine-dextroamphetamine (ADDERALL) 20 MG tablet Take 20 mg by mouth 3 (three) times daily. 02/04/16  Yes Historical Provider, MD  aspirin-acetaminophen-caffeine (EXCEDRIN MIGRAINE) [phone] MG tablet Take 1 tablet by mouth 2 (two) times daily as needed for migraine.    Yes Historical Provider, MD  cetirizine (ZYRTEC) 10 MG tablet Take 10  mg by mouth daily.   Yes Historical Provider, MD  esomeprazole (NEXIUM) 40 MG capsule Take 40 mg by mouth daily. 04/04/16  Yes Historical Provider, MD    Inpatient medications: . amphetamine-dextroamphetamine  20 mg Oral TID  . dextromethorphan-guaiFENesin  1 tablet Oral BID  . enoxaparin (LOVENOX) injection  40 mg Subcutaneous Q24H  . folic acid  1 mg Oral Daily  . gemfibrozil  600 mg Oral BID AC  . loratadine  10 mg Oral Daily  . LORazepam  0-4 mg Intravenous Q6H   Followed by  . [START ON 04/08/2016] LORazepam  0-4 mg Intravenous Q12H  . multivitamin with minerals  1 tablet Oral Daily  . omega-3 acid ethyl esters  1 g Oral BID  . pantoprazole  40 mg Oral Daily  . sodium chloride  1,000 mL Intravenous Once  . thiamine  100 mg Oral Daily   Or  . thiamine  100 mg Intravenous Daily    Discontinued Meds:   Medications Discontinued During This Encounter  Medication Reason  . HYDROcodone-acetaminophen (NORCO) 7.5-325 MG tablet Completed Course  . HYDROmorphone (DILAUDID) injection 1 mg   . 0.9 %  sodium chloride infusion   . dextrose 50 % solution 25 mL   . dextrose 5 %-0.45 % sodium chloride infusion   . dextrose 10 % infusion   . dextrose 50 % solution 50 mL   . insulin regular (NOVOLIN R,HUMULIN R) 250 Units in sodium chloride 0.9 % 250 mL (1 Units/mL) infusion   .  morphine 2 MG/ML injection 2 mg     Social History:  reports that he has never smoked. He does not have any smokeless tobacco history on file. He reports that he drinks alcohol. He reports that he does not use illicit drugs.  Family History:   Family History  Problem Relation Age of Onset  . Hypothyroidism Mother   . Heart disease Mother   . ALS Father     Pertinent items are noted in HPI. Weight change:   Intake/Output Summary (Last 24 hours) at 04/07/16 1536 Last data filed at 04/07/16 1437  Gross per 24 hour  Intake 811.67 ml  Output      0 ml  Net 811.67 ml   BP 147/97 mmHg  Pulse 83  Temp(Src)  98.4 F (36.9 C) (Oral)  Resp 17  Ht 6\' 2"  (1.88 m)  Wt 92.987 kg (205 lb)  BMI 26.31 kg/m2  SpO2 98% Filed Vitals:   04/07/16 0245 04/07/16 0404 04/07/16 0747 04/07/16 1230  BP: 142/98 149/96 147/97   Pulse: 86  87 83  Temp:  98.3 F (36.8 C) 98.1 F (36.7 C) 98.4 F (36.9 C)  TempSrc:  Oral Oral Oral  Resp: 13 15 14 17   Height:  6\' 2"  (1.88 m)    Weight:  92.987 kg (205 lb)    SpO2: 100% 94% 98%      General appearance: alert, cooperative and mild distress Head: Normocephalic, without obvious abnormality, atraumatic Eyes: negative findings: lids and lashes normal, conjunctivae and sclerae normal and corneas clear Resp: clear to auscultation bilaterally Cardio: regular rate and rhythm, S1, S2 normal, no murmur, click, rub or gallop GI: hypoactive bowel sounds, +tenderness to palpation, no guarding or rebound Extremities: extremities normal, atraumatic, no cyanosis or edema  Labs: Basic Metabolic Panel:  Recent Labs Lab 04/06/16 1619 04/07/16 0435 04/07/16 0850 04/07/16 1215  NA 138 135 133* 134*  K 4.0 3.6 3.4* 3.7  CL 100* 101 97* 98*  CO2 27 25 23 24   GLUCOSE 125* 84 111* 124*  BUN 11 13 12 12   CREATININE 1.05 1.00 1.06 1.04  ALBUMIN 3.2* 2.9*  --   --   CALCIUM 8.9 7.7* 7.8* 7.8*   Liver Function Tests:  Recent Labs Lab 04/06/16 1619 04/07/16 0435  AST 251* 196*  ALT 157* 128*  ALKPHOS 216* 190*  BILITOT 1.7* 1.8*  PROT 6.6 5.9*  ALBUMIN 3.2* 2.9*    Recent Labs Lab 04/06/16 1619  LIPASE 923*   No results for input(s): AMMONIA in the last 168 hours. CBC:  Recent Labs Lab 04/06/16 1619 04/07/16 0435  WBC 11.5* 10.2  HGB 14.8 13.8  HCT 40.0 42.3  MCV 91.3 96.6  PLT 270 162   PT/INR: @LABRCNTIP (inr:5) Cardiac Enzymes: )No results for input(s): CKTOTAL, CKMB, CKMBINDEX, TROPONINI in the last 168 hours. CBG:  Recent Labs Lab 04/07/16 0532 04/07/16 0624 04/07/16 0735 04/07/16 0904 04/07/16 1112  GLUCAP 95 81 113* 117* 150*     Iron Studies: No results for input(s): IRON, TIBC, TRANSFERRIN, FERRITIN in the last 168 hours.  Xrays/Other Studies: Dg Chest 2 View  04/07/2016  CLINICAL DATA:  Cough, left lower chest pain EXAM: CHEST  2 VIEW COMPARISON:  None. FINDINGS: Lungs are clear.  No pleural effusion or pneumothorax. The heart is normal in size. Visualized osseous structures are within normal limits. IMPRESSION: Normal chest radiographs. Electronically Signed   By: Charline Bills M.D.   On: 04/07/2016 09:04   US Abdomen Limited  04/06/2016  CLINICAL DATA:  Panic attack, chest pain, nausea, vomiting, increased LFTs EXAM: US ABDOMEN LIMITED - RIGHT UPPER QUADRANT COMPARISON:  None. FINDINGS: Gallbladder: No gallstones or wall thickening visualized. No sonographic Murphy sign noted by sonographer. Common bile duct: Diameter: 4 mm Liver: No focal lesion identified. Increased hepatic parenchymal echogenicity. IMPRESSION: 1. No cholelithiasis or sonographic evidence of acute cholecystitis. 2. Increased hepatic echogenicity as can be seen with hepatic steatosis. Electronically Signed   By: Elige Ko   On: 04/06/2016 20:43     Assessment/Plan: 1.  hypertriglyceridemia-induced acute pancreatitis.  Not responding to current therapy.  Discussed case with Dr. Gwenlyn Perking as well as Connor Martin and his mother and explained that given his markedly elevated triglycerides and lipase, as well as hypocalcemia, he would benefit from initiation of plasmapheresis to help reduce his hypertriglyceridemia and hopefully prevent further pancreatic injury and complications.  He is discussing this with his mother and wants to hold off for now but will discuss HD catheter placement with PCCM and decide at that time.   1. Would use albumin as replacement as studies have shown 61% reduction of triglyceride levels with this modality 2. Discussed that he may need more than one session depending upon triglyceride response 3. Will use citrate as  anticoagulant 2. HTN- stable 3. Hypocalcemia- due to acute pancreatitis. 4. Abnormal LFT's- presumably due to pancreatitis 5. Oliguria- due to volume depletion/third spacing from acute pancreatitis (severe) 6. Hypoalbuminemia    Irena Cords 04/07/2016, 3:36 PM

## 2016-04-07 NOTE — Progress Notes (Signed)
[facility] PROGRESS NOTE  Connor Martin:[id number] DOB: 10/28/1975 DOA: 04/06/2016 PCP: Pcp Not In System  Interim summary and HPI 40 y.o. male with medical history significant of GERD, panic attack and alcohol use, who presented with nausea, vomiting, abdominal pain and cough. Patient reports that he has been having nausea, vomiting, abdominal pain for more than a week. His abdominal pain is located in the left upper quadrant, intermittent, 10 out of 10 in severity, nonradiating. It is not aggravated or alleviated by any known factors. He vomited 4-5 times today without blood in the vomitus. Patient states that he drinks Vodka 3-4 times per week. Per his girlfriend, patient has several episode of panic attack with headache, generalized weakness, anxiety. He had some chest pain and chest discomfort earlier, which has resolved. Currently no chest pain. Patient has dry cough for more than 2 weeks, no SOB. Patient does not have fever, but has chills. No diarrhea, symptoms of UTI or unilateral weakness.  Assessment/Plan: Pancreatitis, acute: Patient's nausea, vomiting and abdominal pain is most likely caused by acute pancreatitis given elevated lipase. Patient has abnormal liver function, but no gallstone by abdominal ultrasound. This is most likely due to alcoholic pancreatitis and hypertriglyceridemia .  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 -perfect split between AST/ALT -hepatitis panel ordered on admission, will follow rec's  Alcohol use: -continue CIWA protocol -cessation counseling provided  Cough: Lungs clear on exam  -CXR w/o acute abnormalities -will continue mucinex and flutter valve   Code Status: Full Family Communication: girlfriend at baseline  Disposition Plan: to be determine; continue treatment inside the hospital for now. Will maximize IVF's, analgesics and antiemetics. Will also start gemfibrozil and lovaza   Consultants:  Nephrology (Dr. Abel Presto)  Procedures:  See below for x-ray reports   Antibiotics:  None   HPI/Subjective: Afebrile, no CP or SOB. Continue experiencing abd pain. Patient w/o vomiting since admission   Objective: Filed Vitals:   04/07/16 0747 04/07/16 1230  BP: 147/97   Pulse: 87 83  Temp: 98.1 F (36.7 C) 98.4 F (36.9 C)  Resp: 14 17    Intake/Output Summary (Last 24 hours) at 04/07/16 1414 Last data filed at 04/07/16 1300  Gross per 24 hour  Intake    650 ml  Output      0 ml  Net    650 ml   Filed Weights   04/07/16 0215 04/07/16 0404  Weight: 92.987 kg (205 lb) 92.987 kg (205 lb)    Exam:   General: afebrile, continue complaining of pain, no vomiting since admission.   Cardiovascular: S1 and S2, no rubs or gallops  Respiratory: good air movement, no wheezing, no crackles  Abdomen: soft, mild guarding (RUQ and epigastric region); no distension, positive BS  Musculoskeletal: no edema or cyanosis   Data Reviewed: Basic Metabolic Panel:  Recent Labs Lab 04/06/16 1619 04/07/16 0435 04/07/16 0850  NA 138 135 133*  K 4.0 3.6 3.4*  CL 100* 101 97*  CO2 27 25 23   GLUCOSE 125* 84 111*  BUN 11 13 12   CREATININE 1.05 1.00 1.06  CALCIUM 8.9 7.7* 7.8*   Liver Function Tests:  Recent Labs Lab 04/06/16 1619 04/07/16 0435  AST 251* 196*  ALT 157*  128*  ALKPHOS 216* 190*  BILITOT 1.7* 1.8*  PROT 6.6 5.9*  ALBUMIN 3.2* 2.9*    Recent Labs Lab 04/06/16 1619  LIPASE 923*   CBC:  Recent Labs Lab 04/06/16 1619 04/07/16 0435  WBC 11.5* 10.2  HGB 14.8 13.8  HCT 40.0 42.3  MCV 91.3 96.6  PLT 270 162   CBG:  Recent Labs Lab 04/07/16 0532 04/07/16 0624 04/07/16 0735 04/07/16 0904 04/07/16 1112  GLUCAP 95 81 113* 117* 150*    Studies: Dg Chest 2 View  04/07/2016  CLINICAL DATA:  Cough, left lower chest pain EXAM: CHEST  2 VIEW COMPARISON:  None. FINDINGS: Lungs are clear.  No pleural effusion or pneumothorax. The heart is normal in size. Visualized osseous structures are within normal limits. IMPRESSION: Normal chest radiographs. Electronically Signed   By: Charline BillsSriyesh  Krishnan M.D.   On: 04/07/2016 09:04   [place] Abdomen Limited  04/06/2016  CLINICAL DATA:  Panic attack, chest pain, nausea, vomiting, increased LFTs EXAM: US ABDOMEN LIMITED - RIGHT UPPER QUADRANT COMPARISON:  None. FINDINGS: Gallbladder: No gallstones or wall thickening visualized. No sonographic Murphy sign noted by sonographer. Common bile duct: Diameter: 4 mm Liver: No focal lesion identified. Increased hepatic parenchymal echogenicity. IMPRESSION: 1. No cholelithiasis or sonographic evidence of acute cholecystitis. 2. Increased hepatic echogenicity as can be seen with hepatic steatosis. Electronically Signed   By: Elige KoHetal  Patel   On: 04/06/2016 20:43    Scheduled Meds: . amphetamine-dextroamphetamine  20 mg Oral TID  . dextromethorphan-guaiFENesin  1 tablet Oral BID  . dextrose      . enoxaparin (LOVENOX) injection  40 mg Subcutaneous Q24H  . folic acid  1 mg Oral Daily  . gemfibrozil  600 mg Oral BID AC  . loratadine  10 mg Oral Daily  . LORazepam  0-4 mg Intravenous Q6H   Followed by  . [START ON 04/08/2016] LORazepam  0-4 mg Intravenous Q12H  . multivitamin with minerals  1 tablet Oral Daily  . omega-3 acid ethyl esters  1 g Oral BID  . pantoprazole   40 mg Oral Daily  . sodium chloride  1,000 mL Intravenous Once  . thiamine  100 mg Oral Daily   Or  . thiamine  100 mg Intravenous Daily   Continuous Infusions: . sodium chloride      Principal Problem:   Pancreatitis, acute Active Problems:   Panic attacks   GERD (gastroesophageal reflux disease)   Abnormal LFTs   Alcohol use (HCC)   Cough   Pancreatitis    Time spent: 35 minutes (>50% dedicated to face to face examination, family bedside update and discussion with other specialist)    Vassie LollMadera, Domanick Cuccia  [facility] Pager [phone]. If 7PM-7AM, please contact night-coverage at [email or web address], password [email or web address] 04/07/2016, 2:14 PM

## 2016-04-08 ENCOUNTER — Inpatient Hospital Stay (HOSPITAL_COMMUNITY): Payer: BLUE CROSS/BLUE SHIELD

## 2016-04-08 LAB — COMPREHENSIVE METABOLIC PANEL
ALBUMIN: 2.6 g/dL — AB (ref 3.5–5.0)
ALK PHOS: 197 U/L — AB (ref 38–126)
ALT: 96 U/L — ABNORMAL HIGH (ref 17–63)
ANION GAP: 13 (ref 5–15)
AST: 129 U/L — ABNORMAL HIGH (ref 15–41)
BUN: 5 mg/dL — ABNORMAL LOW (ref 6–20)
CALCIUM: 7.8 mg/dL — AB (ref 8.9–10.3)
CHLORIDE: 98 mmol/L — AB (ref 101–111)
CO2: 23 mmol/L (ref 22–32)
Creatinine, Ser: 0.97 mg/dL (ref 0.61–1.24)
GFR calc non Af Amer: >60 mL/min (ref >60)
GLUCOSE: 109 mg/dL — AB (ref 65–99)
POTASSIUM: 4.2 mmol/L (ref 3.5–5.1)
SODIUM: 134 mmol/L — AB (ref 135–145)
Total Bilirubin: 3 mg/dL — ABNORMAL HIGH (ref 0.3–1.2)
Total Protein: 5.5 g/dL — ABNORMAL LOW (ref 6.5–8.1)

## 2016-04-08 LAB — LIPASE, BLOOD: Lipase: 154 U/L — ABNORMAL HIGH (ref 11–51)

## 2016-04-08 LAB — HEPATITIS PANEL, ACUTE
HCV Ab: <0.1 s/co ratio (ref 0.0–0.9)
HEP B C IGM: NEGATIVE
Hep A IgM: NEGATIVE
Hepatitis B Surface Ag: NEGATIVE

## 2016-04-08 LAB — GLUCOSE, CAPILLARY
GLUCOSE-CAPILLARY: 107 mg/dL — AB (ref 65–99)
Glucose-Capillary: 122 mg/dL — ABNORMAL HIGH (ref 65–99)
Glucose-Capillary: 131 mg/dL — ABNORMAL HIGH (ref 65–99)

## 2016-04-08 LAB — HEMOGLOBIN A1C
Hgb A1c MFr Bld: 5.4 % (ref 4.8–5.6)
Mean Plasma Glucose: 108 mg/dL

## 2016-04-08 MED ORDER — ACD FORMULA A 0.73-2.45-2.2 GM/100ML VI SOLN
Status: AC
  Administered 2016-04-08: 500 mL
  Filled 2016-04-08: qty 500

## 2016-04-08 MED ORDER — LIDOCAINE HCL 1 % IJ SOLN
INTRAMUSCULAR | Status: AC
  Filled 2016-04-08: qty 20

## 2016-04-08 MED ORDER — HEPARIN SODIUM (PORCINE) 1000 UNIT/ML IJ SOLN
INTRAMUSCULAR | Status: AC
  Filled 2016-04-08: qty 1

## 2016-04-08 MED ORDER — MORPHINE SULFATE (PF) 4 MG/ML IV SOLN
INTRAVENOUS | Status: AC
  Administered 2016-04-08: 4 mg via INTRAVENOUS
  Filled 2016-04-08: qty 1

## 2016-04-08 MED ORDER — SODIUM CHLORIDE 0.9 % IV SOLN
Freq: Once | INTRAVENOUS | Status: DC
  Filled 2016-04-08 (×5): qty 200

## 2016-04-08 MED ORDER — MORPHINE SULFATE (PF) 4 MG/ML IV SOLN
INTRAVENOUS | Status: AC
  Filled 2016-04-08: qty 1

## 2016-04-08 MED ORDER — MORPHINE SULFATE (PF) 2 MG/ML IV SOLN
2.0000 mg | Freq: Once | INTRAVENOUS | Status: AC
  Administered 2016-04-08: 2 mg via INTRAVENOUS

## 2016-04-08 MED ORDER — CALCIUM GLUCONATE 10 % IV SOLN
4.0000 g | INTRAVENOUS | Status: AC
  Administered 2016-04-08: 4 g via INTRAVENOUS
  Filled 2016-04-08: qty 40

## 2016-04-08 NOTE — Progress Notes (Signed)
[facility] PROGRESS NOTE  Connor Martin IOM:[id number] DOB: 1976-05-26 DOA: 04/06/2016 PCP: Pcp Not In System  Interim summary and HPI 40 y.o. male with medical history significant of GERD, panic attack and alcohol use, who presented with nausea, vomiting, abdominal pain and cough. Patient reports that he has been having nausea, vomiting, abdominal pain for more than a week. His abdominal pain is located in the left upper quadrant, intermittent, 10 out of 10 in severity, nonradiating. It is not aggravated or alleviated by any known factors. He vomited 4-5 times today without blood in the vomitus. Patient states that he drinks Vodka 3-4 times per week. Per his girlfriend, patient has several episode of panic attack with headache, generalized weakness, anxiety. He had some chest pain and chest discomfort earlier, which has resolved. Currently no chest pain. Patient has dry cough for more than 2 weeks, no SOB. Patient does not have fever, but has chills. No diarrhea, symptoms of UTI or unilateral weakness.  Assessment/Plan: Pancreatitis, acute: Patient's nausea, vomiting and abdominal pain is most likely caused by acute pancreatitis given elevated lipase. Patient has abnormal liver function, but no gallstone by abdominal ultrasound. This is most likely due to alcoholic pancreatitis and hypertriglyceridemia .  -lipase level is > 3 times normal range, he has hypocalcemia and his TG are 7630 -failed insulin therapy; as CBG's never high enough consistently to run drip properly. 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 -hepatitis panel ordered on admission, will follow results -LFT's trending down  Alcohol use: -continue CIWA protocol -cessation counseling provided  Cough: Lungs clear on exam  -CXR w/o acute abnormalities -will continue mucinex and flutter valve   Code Status: Full Family Communication: girlfriend at baseline  Disposition Plan: to be determine; continue treatment inside the hospital for now. Will maximize IVF's, analgesics and antiemetics. Will also continue gemfibrozil and lovaza. Plan is to have plasmapheresis today 7/15   Consultants:  Nephrology (Dr. Arty Baumgartner)  Procedures:  See below for x-ray reports   Plasmapheresis    Antibiotics:  None   HPI/Subjective: Afebrile, no CP or SOB reported. Continue experiencing abd pain and nausea. Patient w/o vomiting since admission. S/P HD catheter placement w/o complication.   Objective: Filed Vitals:   04/08/16 1029 04/08/16 1123  BP: 137/94 128/81  Pulse: 96 95  Temp: 98.8 F (37.1 C) 97.5 F (36.4 C)  Resp: 18 18    Intake/Output Summary (Last 24 hours) at 04/08/16 1616 Last data filed at 04/08/16 1400  Gross per 24 hour  Intake 2192.08 ml  Output      0 ml  Net 2192.08 ml   Filed Weights   04/07/16 0215 04/07/16 0404  Weight: 92.987 kg (205 lb) 92.987 kg (205 lb)    Exam:   General: afebrile, continue complaining of abd pain and nausea, no vomiting since admission.   Cardiovascular: S1 and S2, no rubs or gallops  Respiratory: good air movement, no wheezing, no crackles  Abdomen: soft, mild guarding (RUQ and epigastric region); no distension, positive BS  Musculoskeletal: no edema or cyanosis  Data Reviewed: Basic Metabolic Panel:  Recent Labs Lab 04/06/16 1619 04/07/16 0435 04/07/16 0850 04/07/16 1215 04/08/16 0637  NA 138 135 133* 134* 134*  K 4.0 3.6  3.4* 3.7 4.2  CL 100* 101 97* 98* 98*  CO2 27 25 23 24 23   GLUCOSE 125* 84 111* 124* 109*  BUN 11 13 12 12  5*  CREATININE 1.05 1.00 1.06 1.04 0.97  CALCIUM 8.9 7.7* 7.8* 7.8* 7.8*   Liver Function Tests:  Recent Labs Lab 04/06/16 1619 04/07/16 0435 04/08/16 0637  AST 251* 196* 129*  ALT 157* 128* 96*  ALKPHOS 216* 190* 197*  BILITOT 1.7* 1.8* 3.0*  PROT 6.6 5.9* 5.5*  ALBUMIN 3.2* 2.9* 2.6*    Recent Labs Lab 04/06/16 1619 04/08/16 0637  LIPASE 923* 154*   CBC:  Recent Labs Lab 04/06/16 1619 04/07/16 0435  WBC 11.5* 10.2  HGB 14.8 13.8  HCT 40.0 42.3  MCV 91.3 96.6  PLT 270 162   CBG:  Recent Labs Lab 04/07/16 1112 04/07/16 1626 04/07/16 2016 04/08/16 0806 04/08/16 1122  GLUCAP 150* 124* 121* 122* 131*    Studies: Dg Chest 2 View  04/07/2016  CLINICAL DATA:  Cough, left lower chest pain EXAM: CHEST  2 VIEW COMPARISON:  None. FINDINGS: Lungs are clear.  No pleural effusion or pneumothorax. The heart is normal in size. Visualized osseous structures are within normal limits. IMPRESSION: Normal chest radiographs. Electronically Signed   By: Julian Hy M.D.   On: 04/07/2016 09:04   US Abdomen Limited  04/06/2016  CLINICAL DATA:  Panic attack, chest pain, nausea, vomiting, increased LFTs EXAM: US ABDOMEN LIMITED - RIGHT UPPER QUADRANT COMPARISON:  None. FINDINGS: Gallbladder: No gallstones or wall thickening visualized. No sonographic Murphy sign noted by sonographer. Common bile duct: Diameter: 4 mm Liver: No focal lesion identified. Increased hepatic parenchymal echogenicity. IMPRESSION: 1. No cholelithiasis or sonographic evidence of acute cholecystitis. 2. Increased hepatic echogenicity as can be seen with hepatic steatosis. Electronically Signed   By: Kathreen Devoid   On: 04/06/2016 20:43   Ir Fluoro Guide Cv Line Right  04/08/2016  INDICATION: Concern for triglyceride related pancreatitis. Please place temporary dialysis catheter for the initiation of  plasmapheresis. EXAM: NON-TUNNELED CENTRAL VENOUS HEMODIALYSIS CATHETER PLACEMENT WITH ULTRASOUND AND FLUOROSCOPIC GUIDANCE COMPARISON:  None. MEDICATIONS: None FLUOROSCOPY TIME:  42 seconds (5 mGy) COMPLICATIONS: None immediate. PROCEDURE: Informed written consent was obtained from the patient after a discussion of the risks, benefits, and alternatives to treatment. Questions regarding the procedure were encouraged and answered. The right neck and chest were prepped with chlorhexidine in a sterile fashion, and a sterile drape was applied covering the operative field. Maximum barrier sterile technique with sterile gowns and gloves were used for the procedure. A timeout was performed prior to the initiation of the procedure. After creating a small venotomy incision, a micropuncture kit was utilized to access the right internal jugular vein under direct, real-time ultrasound guidance after the overlying soft tissues were anesthetized with 1% lidocaine with epinephrine. Ultrasound image documentation was performed. The microwire was kinked to measure appropriate catheter length. A stiff glidewire was advanced to the level of the IVC. Under fluoroscopic guidance, the venotomy was serially dilated, ultimately allowing placement of a 20 cm temporary Mahurkar catheter with tip ultimately terminating within the superior aspect of the right atrium. Final catheter positioning was confirmed and documented with a spot radiographic image. The catheter aspirates and flushes normally. The catheter was flushed with appropriate volume heparin dwells.  The catheter exit site was secured with a 0-Prolene retention suture. A dressing was placed. The patient tolerated the procedure well without immediate post procedural complication. IMPRESSION: Successful placement of a right internal jugular approach 20 cm temporary dialysis catheter with tip terminating with in the superior aspect of the right atrium. The catheter is ready for  immediate use. PLAN: This catheter may be converted to a tunneled dialysis catheter at a later date as indicated. Electronically Signed   By: Sandi Mariscal M.D.   On: 04/08/2016 11:10   Ir US Guide Vasc Access Right  04/08/2016  INDICATION: Concern for triglyceride related pancreatitis. Please place temporary dialysis catheter for the initiation of plasmapheresis. EXAM: NON-TUNNELED CENTRAL VENOUS HEMODIALYSIS CATHETER PLACEMENT WITH ULTRASOUND AND FLUOROSCOPIC GUIDANCE COMPARISON:  None. MEDICATIONS: None FLUOROSCOPY TIME:  42 seconds (5 mGy) COMPLICATIONS: None immediate. PROCEDURE: Informed written consent was obtained from the patient after a discussion of the risks, benefits, and alternatives to treatment. Questions regarding the procedure were encouraged and answered. The right neck and chest were prepped with chlorhexidine in a sterile fashion, and a sterile drape was applied covering the operative field. Maximum barrier sterile technique with sterile gowns and gloves were used for the procedure. A timeout was performed prior to the initiation of the procedure. After creating a small venotomy incision, a micropuncture kit was utilized to access the right internal jugular vein under direct, real-time ultrasound guidance after the overlying soft tissues were anesthetized with 1% lidocaine with epinephrine. Ultrasound image documentation was performed. The microwire was kinked to measure appropriate catheter length. A stiff glidewire was advanced to the level of the IVC. Under fluoroscopic guidance, the venotomy was serially dilated, ultimately allowing placement of a 20 cm temporary Mahurkar catheter with tip ultimately terminating within the superior aspect of the right atrium. Final catheter positioning was confirmed and documented with a spot radiographic image. The catheter aspirates and flushes normally. The catheter was flushed with appropriate volume heparin dwells. The catheter exit site was secured  with a 0-Prolene retention suture. A dressing was placed. The patient tolerated the procedure well without immediate post procedural complication. IMPRESSION: Successful placement of a right internal jugular approach 20 cm temporary dialysis catheter with tip terminating with in the superior aspect of the right atrium. The catheter is ready for immediate use. PLAN: This catheter may be converted to a tunneled dialysis catheter at a later date as indicated. Electronically Signed   By: Sandi Mariscal M.D.   On: 04/08/2016 11:10    Scheduled Meds: . therapeutic plasma exchange solution   Dialysis Once in dialysis  . amphetamine-dextroamphetamine  20 mg Oral TID  . calcium gluconate IVPB  4 g Intravenous Once  . calcium gluconate IVPB  4 g Intravenous To Hemo  . citrate dextrose      . dextromethorphan-guaiFENesin  1 tablet Oral BID  . enoxaparin (LOVENOX) injection  40 mg Subcutaneous Q24H  . folic acid  1 mg Oral Daily  . gemfibrozil  600 mg Oral BID AC  . heparin      . heparin  1,000 Units Intracatheter Once  . lidocaine      . loratadine  10 mg Oral Daily  . LORazepam  0-4 mg Intravenous Q6H   Followed by  . LORazepam  0-4 mg Intravenous Q12H  . morphine      . multivitamin with minerals  1 tablet Oral Daily  . omega-3 acid ethyl esters  1 g Oral BID  . pantoprazole  40 mg Oral Daily  . sodium chloride  1,000 mL Intravenous Once  . thiamine  100 mg Oral Daily   Or  . thiamine  100 mg Intravenous Daily   Continuous Infusions: . sodium chloride 150 mL/hr at 04/08/16 1159  . citrate dextrose      Principal Problem:   Pancreatitis, acute Active Problems:   Panic attacks   GERD (gastroesophageal reflux disease)   Abnormal LFTs   Alcohol use (HCC)   Cough   Pancreatitis    Time spent: 30 minutes (>50% dedicated to face to face examination, family bedside update and discussion with other specialist)    Barton Dubois  [facility] Pager [phone]. If 7PM-7AM, please  contact night-coverage at [email or web address], password [facility] 04/08/2016, 4:16 PM  LOS: 1 day

## 2016-04-08 NOTE — Progress Notes (Signed)
MEDICATION RELATED CONSULT NOTE   IR Procedure Consult - Anticoagulant/Antiplatelet PTA/Inpatient Med List Review by Pharmacist   Procedure: placement of a non-tunneled HD catheter     Completed: 7/15 1110  Post-Procedural bleeding risk per IR MD assessment:  standard  Antithrombotic medications on inpatient or PTA profile prior to procedure: No anticoagulants prior to admission.  Pt continuing Lovenox 40 mg daily   No changes needed. Thank you for allowing us to participate in this patients care. Signe Coltonya C Kuron Docken, PharmD Pager: [phone]

## 2016-04-08 NOTE — Procedures (Signed)
Successful placement of a non-tunneled HD catheter with tips terminating within the superior aspect of the right atrium.   EBL: None The patient tolerated the procedure well without immediate post procedural complication.  The catheter is ready for immediate use.   Jay Maycee Blasco, MD Pager #: [phone]  

## 2016-04-08 NOTE — Progress Notes (Signed)
Patient ID: Connor Martin, male   DOB: 12/29/1975, 40 y.o.   MRN: [id number] S:still with some Nausea and abdominal pain O:BP 132/83 mmHg  Pulse 99  Temp(Src) 97.9 F (36.6 C) (Oral)  Resp 18  Ht  (1.88 m)  Wt 92.987 kg (205 lb)  BMI 26.31 kg/m2  SpO2 95%  Intake/Output Summary (Last 24 hours) at 04/08/16 0841 Last data filed at 04/07/16 1836  Gross per 24 hour  Intake 1443.75 ml  Output      0 ml  Net 1443.75 ml   Intake/Output: I/O last 3 completed shifts: In: 1443.8 [I.V.:1443.8] Out: -   Intake/Output this shift:    Weight change:  Gen:WD WN WM in mild distress CVS:no rub Resp:cta Abd:+BS, soft, mild epigastric tenderness to deep palpation, no guarding or rebound Ext:no edema   Recent Labs Lab 04/06/16 1619 04/07/16 0435 04/07/16 0850 04/07/16 1215 04/08/16 0637  NA 138 135 133* 134* 134*  K 4.0 3.6 3.4* 3.7 4.2  CL 100* 101 97* 98* 98*  CO2 GLUCOSE 125* 84 111* 124* 109*  BUN 5*  CREATININE 1.05 1.00 1.06 1.04 0.97  ALBUMIN 3.2* 2.9*  --   --  2.6*  CALCIUM 8.9 7.7* 7.8* 7.8* 7.8*  AST 251* 196*  --   --  129*  ALT 157* 128*  --   --  96*   Liver Function Tests:  Recent Labs Lab 04/06/16 1619 04/07/16 0435 04/08/16 0637  AST 251* 196* 129*  ALT 157* 128* 96*  ALKPHOS 216* 190* 197*  BILITOT 1.7* 1.8* 3.0*  PROT 6.6 5.9* 5.5*  ALBUMIN 3.2* 2.9* 2.6*    Recent Labs Lab 04/06/16 1619 04/08/16 0637  LIPASE 923* 154*   No results for input(s): AMMONIA in the last 168 hours. CBC:  Recent Labs Lab 04/06/16 1619 04/07/16 0435  WBC 11.5* 10.2  HGB 14.8 13.8  HCT 40.0 42.3  MCV 91.3 96.6  PLT 270 162   Cardiac Enzymes: No results for input(s): CKTOTAL, CKMB, CKMBINDEX, TROPONINI in the last 168 hours. CBG:  Recent Labs Lab 04/07/16 0904 04/07/16 1112 04/07/16 1626 04/07/16 2016 04/08/16 0806  GLUCAP 117* 150* 124* 121* 122*    Iron Studies: No results for input(s): IRON, TIBC,  TRANSFERRIN, FERRITIN in the last 72 hours. Studies/Results: Dg Chest 2 View  04/07/2016  CLINICAL DATA:  Cough, left lower chest pain EXAM: CHEST  2 VIEW COMPARISON:  None. FINDINGS: Lungs are clear.  No pleural effusion or pneumothorax. The heart is normal in size. Visualized osseous structures are within normal limits. IMPRESSION: Normal chest radiographs. Electronically Signed   By: Charline Bills M.D.   On: 04/07/2016 09:04   US Abdomen Limited  04/06/2016  CLINICAL DATA:  Panic attack, chest pain, nausea, vomiting, increased LFTs EXAM: US ABDOMEN LIMITED - RIGHT UPPER QUADRANT COMPARISON:  None. FINDINGS: Gallbladder: No gallstones or wall thickening visualized. No sonographic Murphy sign noted by sonographer. Common bile duct: Diameter: 4 mm Liver: No focal lesion identified. Increased hepatic parenchymal echogenicity. IMPRESSION: 1. No cholelithiasis or sonographic evidence of acute cholecystitis. 2. Increased hepatic echogenicity as can be seen with hepatic steatosis. Electronically Signed   By: Elige Ko   On: 04/06/2016 20:43   . therapeutic plasma exchange solution   Dialysis Once in dialysis  . amphetamine-dextroamphetamine  20 mg Oral TID  . calcium gluconate IVPB  4 g Intravenous Once  . dextromethorphan-guaiFENesin  1 tablet  Oral BID  . enoxaparin (LOVENOX) injection  40 mg Subcutaneous Q24H  . folic acid  1 mg Oral Daily  . gemfibrozil  600 mg Oral BID AC  . heparin  1,000 Units Intracatheter Once  . loratadine  10 mg Oral Daily  . LORazepam  0-4 mg Intravenous Q6H   Followed by  . LORazepam  0-4 mg Intravenous Q12H  . multivitamin with minerals  1 tablet Oral Daily  . omega-3 acid ethyl esters  1 g Oral BID  . pantoprazole  40 mg Oral Daily  . sodium chloride  1,000 mL Intravenous Once  . thiamine  100 mg Oral Daily   Or  . thiamine  100 mg Intravenous Daily    BMET    Component Value Date/Time   NA 134* 04/07/2016 1215   K 3.7 04/07/2016 1215   CL 98*  04/07/2016 1215   CO2 24 04/07/2016 1215   GLUCOSE 124* 04/07/2016 1215   BUN 12 04/07/2016 1215   CREATININE 1.04 04/07/2016 1215   CALCIUM 7.8* 04/07/2016 1215   GFRNONAA >60 04/07/2016 1215   GFRAA >60 04/07/2016 1215   CBC    Component Value Date/Time   WBC 10.2 04/07/2016 0435   RBC 4.38 04/07/2016 0435   HGB 13.8 04/07/2016 0435   HCT 42.3 04/07/2016 0435   PLT 162 04/07/2016 0435   MCV 96.6 04/07/2016 0435   MCH 31.5 04/07/2016 0435   MCHC 32.6 04/07/2016 0435   RDW 14.9 04/07/2016 0435     Assessment/Plan: 1. hypertriglyceridemia-induced acute pancreatitis. Not responding to current therapy. Discussed case with Dr. Gwenlyn Martin as well as Mr. Connor Martin and his mother and explained that given his markedly elevated triglycerides and lipase, as well as hypocalcemia, he would benefit from initiation of plasmapheresis to help reduce his hypertriglyceridemia and hopefully prevent further pancreatic injury and complications. He is amenable to proceed and will follow triglyceride levels (lipase has improved with bowel rest and IVF's).    1. Will use albumin as replacement as studies have shown 61% reduction of triglyceride levels with this modality 2. Discussed that he may need more than one session depending upon triglyceride response 3. Will use citrate as anticoagulant 4. Appreciate IR assistance with HD cath in RIJ placed earlier today. 2. HTN- stable 3. Hypocalcemia- due to acute pancreatitis. 4. Abnormal LFT's- presumably due to pancreatitis 5. Oliguria- due to volume depletion/third spacing from acute pancreatitis (severe).  Follow I's/o's as Scr improving. 6. Hypoalbuminemia  Connor Martin

## 2016-04-09 DIAGNOSIS — E781 Pure hyperglyceridemia: Secondary | ICD-10-CM | POA: Insufficient documentation

## 2016-04-09 LAB — COMPREHENSIVE METABOLIC PANEL
ALK PHOS: 63 U/L (ref 38–126)
ALT: 30 U/L (ref 17–63)
AST: 47 U/L — AB (ref 15–41)
Albumin: 3.6 g/dL (ref 3.5–5.0)
Anion gap: 5 (ref 5–15)
BILIRUBIN TOTAL: 2.2 mg/dL — AB (ref 0.3–1.2)
CALCIUM: 7 mg/dL — AB (ref 8.9–10.3)
CO2: 25 mmol/L (ref 22–32)
CREATININE: 0.99 mg/dL (ref 0.61–1.24)
Chloride: 109 mmol/L (ref 101–111)
Glucose, Bld: 95 mg/dL (ref 65–99)
Potassium: 3.6 mmol/L (ref 3.5–5.1)
Sodium: 139 mmol/L (ref 135–145)
TOTAL PROTEIN: 4.5 g/dL — AB (ref 6.5–8.1)

## 2016-04-09 LAB — CBC
HCT: 32.8 % — ABNORMAL LOW (ref 39.0–52.0)
Hemoglobin: 10.7 g/dL — ABNORMAL LOW (ref 13.0–17.0)
MCH: 31.9 pg (ref 26.0–34.0)
MCHC: 32.6 g/dL (ref 30.0–36.0)
MCV: 97.9 fL (ref 78.0–100.0)
PLATELETS: 133 10*3/uL — AB (ref 150–400)
RBC: 3.35 MIL/uL — AB (ref 4.22–5.81)
RDW: 15 % (ref 11.5–15.5)
WBC: 7.8 10*3/uL (ref 4.0–10.5)

## 2016-04-09 LAB — TRIGLYCERIDES: TRIGLYCERIDES: 224 mg/dL — AB (ref <150)

## 2016-04-09 LAB — LIPASE, BLOOD: LIPASE: 87 U/L — AB (ref 11–51)

## 2016-04-09 LAB — LDL CHOLESTEROL, DIRECT: LDL DIRECT: 35 mg/dL (ref 0–99)

## 2016-04-09 LAB — GLUCOSE, CAPILLARY
GLUCOSE-CAPILLARY: 120 mg/dL — AB (ref 65–99)
GLUCOSE-CAPILLARY: 117 mg/dL — AB (ref 65–99)
Glucose-Capillary: 94 mg/dL (ref 65–99)
Glucose-Capillary: 94 mg/dL (ref 65–99)

## 2016-04-09 MED ORDER — POLYETHYLENE GLYCOL 3350 17 G PO PACK
17.0000 g | PACK | Freq: Every day | ORAL | Status: DC | PRN
  Administered 2016-04-09 (×2): 17 g via ORAL
  Filled 2016-04-09 (×2): qty 1

## 2016-04-09 MED ORDER — MORPHINE SULFATE (PF) 4 MG/ML IV SOLN
4.0000 mg | Freq: Four times a day (QID) | INTRAVENOUS | Status: DC | PRN
  Administered 2016-04-09 – 2016-04-10 (×3): 4 mg via INTRAVENOUS
  Filled 2016-04-09 (×3): qty 1

## 2016-04-09 MED ORDER — MAGNESIUM SULFATE 2 GM/50ML IV SOLN
2.0000 g | Freq: Once | INTRAVENOUS | Status: AC
Start: 2016-04-09 — End: 2016-04-09
  Administered 2016-04-09: 2 g via INTRAVENOUS
  Filled 2016-04-09: qty 50

## 2016-04-09 MED ORDER — METOPROLOL TARTRATE 5 MG/5ML IV SOLN: 2.5000 mg | INTRAVENOUS | Status: DC | PRN

## 2016-04-09 MED ORDER — OXYCODONE HCL 5 MG PO TABS
10.0000 mg | ORAL_TABLET | ORAL | Status: DC | PRN
  Administered 2016-04-09 – 2016-04-10 (×5): 10 mg via ORAL
  Filled 2016-04-09 (×5): qty 2

## 2016-04-09 NOTE — Progress Notes (Signed)
Patient ID: Connor Martin, male   DOB: 16-Mar-1976, 40 y.o.   MRN: [id number] S: had some abdominal pain early this morning relieved with meds O:BP 116/75 mmHg  Pulse 100  Temp(Src) 97.8 F (36.6 C) (Axillary)  Resp 20  Ht 6' 2"  (1.88 m)  Wt 97.614 kg (215 lb 3.2 oz)  BMI 27.62 kg/m2  SpO2 99%  Intake/Output Summary (Last 24 hours) at 04/09/16 0900 Last data filed at 04/09/16 0500  Gross per 24 hour  Intake   1620 ml  Output      0 ml  Net   1620 ml   Intake/Output: I/O last 3 completed shifts: In: 1620 [P.O.:120; I.V.:1500] Out: -   Intake/Output this shift:    Weight change:  Gen:WD WN WM in NAD CVS:no rub Resp:cta Abd:+BS, soft, no guarding/rebound Ext:no edema but infiltrated left arm IV   Recent Labs Lab 04/06/16 1619 04/07/16 0435 04/07/16 0850 04/07/16 1215 04/08/16 0637 04/09/16 0345  NA 138 135 133* 134* 134* 139  K 4.0 3.6 3.4* 3.7 4.2 3.6  CL 100* 101 97* 98* 98* 109  CO2 27 25 23 24 23 25   GLUCOSE 125* 84 111* 124* 109* 95  BUN 11 13 12 12  5* <5*  CREATININE 1.05 1.00 1.06 1.04 0.97 0.99  ALBUMIN 3.2* 2.9*  --   --  2.6* 3.6  CALCIUM 8.9 7.7* 7.8* 7.8* 7.8* 7.0*  AST 251* 196*  --   --  129* 47*  ALT 157* 128*  --   --  96* 30   Liver Function Tests:  Recent Labs Lab 04/07/16 0435 04/08/16 0637 04/09/16 0345  AST 196* 129* 47*  ALT 128* 96* 30  ALKPHOS 190* 197* 63  BILITOT 1.8* 3.0* 2.2*  PROT 5.9* 5.5* 4.5*  ALBUMIN 2.9* 2.6* 3.6    Recent Labs Lab 04/06/16 1619 04/08/16 0637 04/09/16 0345  LIPASE 923* 154* 87*   No results for input(s): AMMONIA in the last 168 hours. CBC:  Recent Labs Lab 04/06/16 1619 04/07/16 0435 04/09/16 0345  WBC 11.5* 10.2 7.8  HGB 14.8 13.8 10.7*  HCT 40.0 42.3 32.8*  MCV 91.3 96.6 97.9  PLT 270 162 133*   Cardiac Enzymes: No results for input(s): CKTOTAL, CKMB, CKMBINDEX, TROPONINI in the last 168 hours. CBG:  Recent Labs Lab 04/07/16 1626 04/07/16 2016 04/08/16 0806  04/08/16 1122 04/08/16 2102  GLUCAP 124* 121* 122* 131* 107*    Iron Studies: No results for input(s): IRON, TIBC, TRANSFERRIN, FERRITIN in the last 72 hours. Studies/Results: Ir Fluoro Guide Cv Line Right  04/08/2016  INDICATION: Concern for triglyceride related pancreatitis. Please place temporary dialysis catheter for the initiation of plasmapheresis. EXAM: NON-TUNNELED CENTRAL VENOUS HEMODIALYSIS CATHETER PLACEMENT WITH ULTRASOUND AND FLUOROSCOPIC GUIDANCE COMPARISON:  None. MEDICATIONS: None FLUOROSCOPY TIME:  42 seconds (5 mGy) COMPLICATIONS: None immediate. PROCEDURE: Informed written consent was obtained from the patient after a discussion of the risks, benefits, and alternatives to treatment. Questions regarding the procedure were encouraged and answered. The right neck and chest were prepped with chlorhexidine in a sterile fashion, and a sterile drape was applied covering the operative field. Maximum barrier sterile technique with sterile gowns and gloves were used for the procedure. A timeout was performed prior to the initiation of the procedure. After creating a small venotomy incision, a micropuncture kit was utilized to access the right internal jugular vein under direct, real-time ultrasound guidance after the overlying soft tissues were anesthetized with 1% lidocaine with epinephrine. Ultrasound image  documentation was performed. The microwire was kinked to measure appropriate catheter length. A stiff glidewire was advanced to the level of the IVC. Under fluoroscopic guidance, the venotomy was serially dilated, ultimately allowing placement of a 20 cm temporary Mahurkar catheter with tip ultimately terminating within the superior aspect of the right atrium. Final catheter positioning was confirmed and documented with a spot radiographic image. The catheter aspirates and flushes normally. The catheter was flushed with appropriate volume heparin dwells. The catheter exit site was secured with  a 0-Prolene retention suture. A dressing was placed. The patient tolerated the procedure well without immediate post procedural complication. IMPRESSION: Successful placement of a right internal jugular approach 20 cm temporary dialysis catheter with tip terminating with in the superior aspect of the right atrium. The catheter is ready for immediate use. PLAN: This catheter may be converted to a tunneled dialysis catheter at a later date as indicated. Electronically Signed   By: Sandi Mariscal M.D.   On: 04/08/2016 11:10   Ir US Guide Vasc Access Right  04/08/2016  INDICATION: Concern for triglyceride related pancreatitis. Please place temporary dialysis catheter for the initiation of plasmapheresis. EXAM: NON-TUNNELED CENTRAL VENOUS HEMODIALYSIS CATHETER PLACEMENT WITH ULTRASOUND AND FLUOROSCOPIC GUIDANCE COMPARISON:  None. MEDICATIONS: None FLUOROSCOPY TIME:  42 seconds (5 mGy) COMPLICATIONS: None immediate. PROCEDURE: Informed written consent was obtained from the patient after a discussion of the risks, benefits, and alternatives to treatment. Questions regarding the procedure were encouraged and answered. The right neck and chest were prepped with chlorhexidine in a sterile fashion, and a sterile drape was applied covering the operative field. Maximum barrier sterile technique with sterile gowns and gloves were used for the procedure. A timeout was performed prior to the initiation of the procedure. After creating a small venotomy incision, a micropuncture kit was utilized to access the right internal jugular vein under direct, real-time ultrasound guidance after the overlying soft tissues were anesthetized with 1% lidocaine with epinephrine. Ultrasound image documentation was performed. The microwire was kinked to measure appropriate catheter length. A stiff glidewire was advanced to the level of the IVC. Under fluoroscopic guidance, the venotomy was serially dilated, ultimately allowing placement of a 20 cm  temporary Mahurkar catheter with tip ultimately terminating within the superior aspect of the right atrium. Final catheter positioning was confirmed and documented with a spot radiographic image. The catheter aspirates and flushes normally. The catheter was flushed with appropriate volume heparin dwells. The catheter exit site was secured with a 0-Prolene retention suture. A dressing was placed. The patient tolerated the procedure well without immediate post procedural complication. IMPRESSION: Successful placement of a right internal jugular approach 20 cm temporary dialysis catheter with tip terminating with in the superior aspect of the right atrium. The catheter is ready for immediate use. PLAN: This catheter may be converted to a tunneled dialysis catheter at a later date as indicated. Electronically Signed   By: Sandi Mariscal M.D.   On: 04/08/2016 11:10   . therapeutic plasma exchange solution   Dialysis Once in dialysis  . amphetamine-dextroamphetamine  20 mg Oral TID  . dextromethorphan-guaiFENesin  1 tablet Oral BID  . enoxaparin (LOVENOX) injection  40 mg Subcutaneous Q24H  . folic acid  1 mg Oral Daily  . gemfibrozil  600 mg Oral BID AC  . heparin  1,000 Units Intracatheter Once  . loratadine  10 mg Oral Daily  . LORazepam  0-4 mg Intravenous Q12H  . multivitamin with minerals  1 tablet Oral Daily  .  omega-3 acid ethyl esters  1 g Oral BID  . pantoprazole  40 mg Oral Daily  . sodium chloride  1,000 mL Intravenous Once  . thiamine  100 mg Oral Daily   Or  . thiamine  100 mg Intravenous Daily    BMET    Component Value Date/Time   NA 139 04/09/2016 0345   K 3.6 04/09/2016 0345   CL 109 04/09/2016 0345   CO2 25 04/09/2016 0345   GLUCOSE 95 04/09/2016 0345   BUN <5* 04/09/2016 0345   CREATININE 0.99 04/09/2016 0345   CALCIUM 7.0* 04/09/2016 0345   GFRNONAA >60 04/09/2016 0345   GFRAA >60 04/09/2016 0345   CBC    Component Value Date/Time   WBC 7.8 04/09/2016 0345   RBC  3.35* 04/09/2016 0345   HGB 10.7* 04/09/2016 0345   HCT 32.8* 04/09/2016 0345   PLT 133* 04/09/2016 0345   MCV 97.9 04/09/2016 0345   MCH 31.9 04/09/2016 0345   MCHC 32.6 04/09/2016 0345   RDW 15.0 04/09/2016 0345     Assessment/Plan: 1. hypertriglyceridemia-induced acute pancreatitis. Discussed case with Dr. Dyann Kief as well as Mr. Papadopoulos and his mother and explained that given his markedly elevated triglycerides and lipase, as well as hypocalcemia, he would benefit from initiation of plasmapheresis to help reduce his hypertriglyceridemia and hopefully prevent further pancreatic injury and complications. He tolerated the procedure well with marked improvement in his triglyceride level from >5000 to only 224.    1. Marked improvement of triglycerides with only one treatment.  No further treatments at this time. 2. Continue to monitor triglycerides and cont with lipid control. 3. Will need to f/u with GI for ongoing evaluation and therapy maintenance 4. Ok to use trialysis catheter pigtail for IVF's and meds.   2. HTN- stable 3. Hypocalcemia- due to acute pancreatitis. 4. Abnormal LFT's- presumably due to pancreatitis.  Improving. 5. Oliguria- due to volume depletion/third spacing from acute pancreatitis (severe). Follow I's/o's as Scr improving. 6. Hypoalbuminemia- improving. 7. Vascular access- recheck triglycerides tomorrow to ensure adequate control then can discontinue HD cath.  [place]

## 2016-04-09 NOTE — Progress Notes (Signed)
Pt oob to sink for daily hygiene and to shave. HD catheter drsg loosened at edge while patient oob to shave.  Pt impulsive oob and IV line pulling against Catheter. Drsg intact over insertion site. Drsg reinforced with medipore tape over original drsg. IV line taped and secure to pt's shirt. Pt instructed to call for assistance oob to help manage IV pole to ensure IV line stays secure and does not pull at insertion site. Verbalized understanding. Mother into room at bedside.

## 2016-04-09 NOTE — Plan of Care (Signed)
Problem: Education: Goal: Knowledge of [facility] Education information/materials will improve Outcome: Progressing Verbalizes understanding of hospital routine.

## 2016-04-09 NOTE — Progress Notes (Signed)
Pt had several runs of V-tach back to back with the longer of 13 beats. Pt is asymptomatic. Md paged.  Colleen Canesar Teale Goodgame, RN

## 2016-04-09 NOTE — Plan of Care (Signed)
Problem: Nutrition Goal: Patient maintains adequate hydration Outcome: Progressing Increased PO intake to Clear Liquids. Tolerates well. IVF decreased to 100 ml/hr.

## 2016-04-09 NOTE — Progress Notes (Signed)
[facility] PROGRESS NOTE  Connor Martin KFE:[id number] DOB: Aug 02, 1976 DOA: 04/06/2016 PCP: Pcp Not In System  Interim summary and HPI 40 y.o. male with medical history significant of GERD, panic attack and alcohol use, who presented with nausea, vomiting, abdominal pain and cough. Patient reports that he has been having nausea, vomiting, abdominal pain for more than a week. His abdominal pain is located in the left upper quadrant, intermittent, 10 out of 10 in severity, nonradiating. It is not aggravated or alleviated by any known factors. He vomited 4-5 times today without blood in the vomitus. Patient states that he drinks Vodka 3-4 times per week. Per his girlfriend, patient has several episode of panic attack with headache, generalized weakness, anxiety. He had some chest pain and chest discomfort earlier, which has resolved. Currently no chest pain. Patient has dry cough for more than 2 weeks, no SOB. Patient does not have fever, but has chills. No diarrhea, symptoms of UTI or unilateral weakness.  Assessment/Plan: Pancreatitis, acute: Patient's nausea, vomiting and abdominal pain is most likely caused by acute pancreatitis given elevated lipase. Patient has abnormal liver function, but no gallstone by abdominal ultrasound. This is most likely due to alcoholic pancreatitis and hypertriglyceridemia .  -lipase level was > 3 times normal range, he had hypocalcemia and his TG were 7630. After plasmapheresis (one treatment; TG 224 and lipase 87). Patient is feeling better and reports improvement in his abd pain. -failed insulin therapy; as CBG's never high enough consistently to run drip properly. -no further nausea or vomiting currently -will continue supportive care, IVF's (but decreasing rate today), PRN antiemetics and analgesics  -appreciate assistance from nephrology with plasmapheresis treatment.  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 -hepatitis panel ordered on admission and neg -LFT's trending down and almost completely back to normal   Alcohol use: -continue CIWA protocol -cessation counseling provided  Cough: Lungs clear on exam  -CXR w/o acute abnormalities -will continue mucinex PRN and flutter valve   Code Status: Full Family Communication: girlfriend at baseline  Disposition Plan: home in the next 24-48 hours. Will advance diet to CLD, decrease IVF's rate and maximize PO pain meds use. Excellent improvement in his triglycerides and lipase level with plasmapheresis. Will also continue gemfibrozil and lovaza.    Consultants:  Nephrology (Dr. Arty Baumgartner)  Procedures:  See below for x-ray reports   Plasmapheresis  7/15  Antibiotics:  None   HPI/Subjective: Afebrile, no CP or SOB reported. Reports improvement in abd pain (even still present), no nausea, no vomiting.   Objective: Filed Vitals:   04/09/16 0500 04/09/16 0717  BP: 120/78 116/75  Pulse: 88 100  Temp: 98.3 F (36.8 C) 97.8 F (36.6 C)  Resp: 20 20    Intake/Output Summary (Last 24 hours) at 04/09/16 0928 Last data filed at 04/09/16 0500  Gross per 24 hour  Intake   1620 ml  Output      0 ml  Net   1620 ml   Filed Weights   04/07/16 0215 04/07/16 0404 04/09/16 0500  Weight: 92.987 kg (205 lb) 92.987 kg (205 lb) 97.614 kg (215 lb 3.2 oz)  Exam:   General: afebrile, continue complaining of some abd pain (even significantly better); denies CP and SOB. Patient also endorses no nausea and no vomiting.    Cardiovascular: S1 and S2, no rubs or gallops  Respiratory: good air  movement, no wheezing, no crackles  Abdomen: soft, mild guarding (epigastric region); no distension, positive BS  Musculoskeletal: no edema or cyanosis   Data Reviewed: Basic Metabolic Panel:  Recent Labs Lab 04/07/16 0435 04/07/16 0850 04/07/16 1215 04/08/16 0637 04/09/16 0345  NA 135 133* 134* 134* 139  K 3.6 3.4* 3.7 4.2 3.6  CL 101 97* 98* 98* 109  CO2 25 23 24 23 25   GLUCOSE 84 111* 124* 109* 95  BUN 13 12 12  5* <5*  CREATININE 1.00 1.06 1.04 0.97 0.99  CALCIUM 7.7* 7.8* 7.8* 7.8* 7.0*   Liver Function Tests:  Recent Labs Lab 04/06/16 1619 04/07/16 0435 04/08/16 0637 04/09/16 0345  AST 251* 196* 129* 47*  ALT 157* 128* 96* 30  ALKPHOS 216* 190* 197* 63  BILITOT 1.7* 1.8* 3.0* 2.2*  PROT 6.6 5.9* 5.5* 4.5*  ALBUMIN 3.2* 2.9* 2.6* 3.6    Recent Labs Lab 04/06/16 1619 04/08/16 0637 04/09/16 0345  LIPASE 923* 154* 87*   CBC:  Recent Labs Lab 04/06/16 1619 04/07/16 0435 04/09/16 0345  WBC 11.5* 10.2 7.8  HGB 14.8 13.8 10.7*  HCT 40.0 42.3 32.8*  MCV 91.3 96.6 97.9  PLT 270 162 133*   CBG:  Recent Labs Lab 04/07/16 1626 04/07/16 2016 04/08/16 0806 04/08/16 1122 04/08/16 2102  GLUCAP 124* 121* 122* 131* 107*    Studies: Ir Fluoro Guide Cv Line Right  04/08/2016  INDICATION: Concern for triglyceride related pancreatitis. Please place temporary dialysis catheter for the initiation of plasmapheresis. EXAM: NON-TUNNELED CENTRAL VENOUS HEMODIALYSIS CATHETER PLACEMENT WITH ULTRASOUND AND FLUOROSCOPIC GUIDANCE COMPARISON:  None. MEDICATIONS: None FLUOROSCOPY TIME:  42 seconds (5 mGy) COMPLICATIONS: None immediate. PROCEDURE: Informed written consent was obtained from the patient after a discussion of the risks, benefits, and alternatives to treatment. Questions regarding the procedure were encouraged and answered. The right neck and chest were prepped with chlorhexidine in a sterile fashion, and a sterile drape was applied covering the operative  field. Maximum barrier sterile technique with sterile gowns and gloves were used for the procedure. A timeout was performed prior to the initiation of the procedure. After creating a small venotomy incision, a micropuncture kit was utilized to access the right internal jugular vein under direct, real-time ultrasound guidance after the overlying soft tissues were anesthetized with 1% lidocaine with epinephrine. Ultrasound image documentation was performed. The microwire was kinked to measure appropriate catheter length. A stiff glidewire was advanced to the level of the IVC. Under fluoroscopic guidance, the venotomy was serially dilated, ultimately allowing placement of a 20 cm temporary Mahurkar catheter with tip ultimately terminating within the superior aspect of the right atrium. Final catheter positioning was confirmed and documented with a spot radiographic image. The catheter aspirates and flushes normally. The catheter was flushed with appropriate volume heparin dwells. The catheter exit site was secured with a 0-Prolene retention suture. A dressing was placed. The patient tolerated the procedure well without immediate post procedural complication. IMPRESSION: Successful placement of a right internal jugular approach 20 cm temporary dialysis catheter with tip terminating with in the superior aspect of the right atrium. The catheter is ready for immediate use. PLAN: This catheter may be converted to a tunneled dialysis catheter at a later date as indicated. Electronically  Signed   By: Sandi Mariscal M.D.   On: 04/08/2016 11:10   Ir US Guide Vasc Access Right  04/08/2016  INDICATION: Concern for triglyceride related pancreatitis. Please place temporary dialysis catheter for the initiation of plasmapheresis. EXAM: NON-TUNNELED CENTRAL VENOUS HEMODIALYSIS CATHETER PLACEMENT WITH ULTRASOUND AND FLUOROSCOPIC GUIDANCE COMPARISON:  None. MEDICATIONS: None FLUOROSCOPY TIME:  42 seconds (5 mGy) COMPLICATIONS: None  immediate. PROCEDURE: Informed written consent was obtained from the patient after a discussion of the risks, benefits, and alternatives to treatment. Questions regarding the procedure were encouraged and answered. The right neck and chest were prepped with chlorhexidine in a sterile fashion, and a sterile drape was applied covering the operative field. Maximum barrier sterile technique with sterile gowns and gloves were used for the procedure. A timeout was performed prior to the initiation of the procedure. After creating a small venotomy incision, a micropuncture kit was utilized to access the right internal jugular vein under direct, real-time ultrasound guidance after the overlying soft tissues were anesthetized with 1% lidocaine with epinephrine. Ultrasound image documentation was performed. The microwire was kinked to measure appropriate catheter length. A stiff glidewire was advanced to the level of the IVC. Under fluoroscopic guidance, the venotomy was serially dilated, ultimately allowing placement of a 20 cm temporary Mahurkar catheter with tip ultimately terminating within the superior aspect of the right atrium. Final catheter positioning was confirmed and documented with a spot radiographic image. The catheter aspirates and flushes normally. The catheter was flushed with appropriate volume heparin dwells. The catheter exit site was secured with a 0-Prolene retention suture. A dressing was placed. The patient tolerated the procedure well without immediate post procedural complication. IMPRESSION: Successful placement of a right internal jugular approach 20 cm temporary dialysis catheter with tip terminating with in the superior aspect of the right atrium. The catheter is ready for immediate use. PLAN: This catheter may be converted to a tunneled dialysis catheter at a later date as indicated. Electronically Signed   By: Sandi Mariscal M.D.   On: 04/08/2016 11:10    Scheduled Meds: . therapeutic plasma  exchange solution   Dialysis Once in dialysis  . amphetamine-dextroamphetamine  20 mg Oral TID  . dextromethorphan-guaiFENesin  1 tablet Oral BID  . enoxaparin (LOVENOX) injection  40 mg Subcutaneous Q24H  . folic acid  1 mg Oral Daily  . gemfibrozil  600 mg Oral BID AC  . heparin  1,000 Units Intracatheter Once  . loratadine  10 mg Oral Daily  . LORazepam  0-4 mg Intravenous Q12H  . magnesium sulfate 1 - 4 g bolus IVPB  2 g Intravenous Once  . multivitamin with minerals  1 tablet Oral Daily  . omega-3 acid ethyl esters  1 g Oral BID  . pantoprazole  40 mg Oral Daily  . sodium chloride  1,000 mL Intravenous Once  . thiamine  100 mg Oral Daily   Or  . thiamine  100 mg Intravenous Daily   Continuous Infusions: . sodium chloride 150 mL/hr at 04/08/16 1159  . citrate dextrose      Principal Problem:   Pancreatitis, acute Active Problems:   Panic attacks   GERD (gastroesophageal reflux disease)   Abnormal LFTs   Alcohol use (HCC)   Cough   Pancreatitis    Time spent: 30 minutes (>50% dedicated to face to face examination, family bedside update and discussion with other specialist)    Barton Dubois  [facility] Pager [phone]. If 7PM-7AM, please contact night-coverage at  [email or web address], password [facility] 04/09/2016, 9:28 AM  LOS: 2 days

## 2016-04-10 DIAGNOSIS — K219 Gastro-esophageal reflux disease without esophagitis: Secondary | ICD-10-CM | POA: Insufficient documentation

## 2016-04-10 LAB — POCT I-STAT, CHEM 8
BUN: <3 mg/dL — ABNORMAL LOW (ref 6–20)
CALCIUM ION: 1.12 mmol/L — AB (ref 1.13–1.30)
CREATININE: 0.9 mg/dL (ref 0.61–1.24)
Chloride: 100 mmol/L — ABNORMAL LOW (ref 101–111)
GLUCOSE: 111 mg/dL — AB (ref 65–99)
HEMATOCRIT: 36 % — AB (ref 39.0–52.0)
HEMOGLOBIN: 12.2 g/dL — AB (ref 13.0–17.0)
Potassium: 3.2 mmol/L — ABNORMAL LOW (ref 3.5–5.1)
Sodium: 136 mmol/L (ref 135–145)
TCO2: 28 mmol/L (ref 0–100)

## 2016-04-10 LAB — GLUCOSE, CAPILLARY: GLUCOSE-CAPILLARY: 128 mg/dL — AB (ref 65–99)

## 2016-04-10 LAB — COMPREHENSIVE METABOLIC PANEL
ALK PHOS: 90 U/L (ref 38–126)
ALT: 46 U/L (ref 17–63)
ANION GAP: 6 (ref 5–15)
AST: 74 U/L — ABNORMAL HIGH (ref 15–41)
Albumin: 3.6 g/dL (ref 3.5–5.0)
BUN: <5 mg/dL — ABNORMAL LOW (ref 6–20)
CALCIUM: 7.7 mg/dL — AB (ref 8.9–10.3)
CO2: 23 mmol/L (ref 22–32)
Chloride: 108 mmol/L (ref 101–111)
Creatinine, Ser: 0.94 mg/dL (ref 0.61–1.24)
Glucose, Bld: 123 mg/dL — ABNORMAL HIGH (ref 65–99)
Potassium: 3.8 mmol/L (ref 3.5–5.1)
SODIUM: 137 mmol/L (ref 135–145)
TOTAL PROTEIN: 5.2 g/dL — AB (ref 6.5–8.1)
Total Bilirubin: 2.2 mg/dL — ABNORMAL HIGH (ref 0.3–1.2)

## 2016-04-10 LAB — MAGNESIUM: MAGNESIUM: 2.1 mg/dL (ref 1.7–2.4)

## 2016-04-10 LAB — LIPASE, BLOOD: Lipase: 85 U/L — ABNORMAL HIGH (ref 11–51)

## 2016-04-10 LAB — TRIGLYCERIDES: Triglycerides: 325 mg/dL — ABNORMAL HIGH (ref <150)

## 2016-04-10 MED ORDER — OXYCODONE HCL 5 MG PO TABS: 5.0000 mg | ORAL_TABLET | Freq: Four times a day (QID) | ORAL | Status: DC | PRN

## 2016-04-10 MED ORDER — ADULT MULTIVITAMIN W/MINERALS CH: 1.0000 | ORAL_TABLET | Freq: Every day | ORAL | Status: AC

## 2016-04-10 MED ORDER — OXYCODONE HCL 5 MG PO TABS
10.0000 mg | ORAL_TABLET | Freq: Once | ORAL | Status: AC
  Administered 2016-04-10: 10 mg via ORAL
  Filled 2016-04-10: qty 2

## 2016-04-10 MED ORDER — OMEGA-3-ACID ETHYL ESTERS 1 G PO CAPS: 1.0000 g | ORAL_CAPSULE | Freq: Two times a day (BID) | ORAL | 5 refills | Status: DC

## 2016-04-10 MED ORDER — GEMFIBROZIL 600 MG PO TABS: 600.0000 mg | ORAL_TABLET | Freq: Two times a day (BID) | ORAL | Status: DC

## 2016-04-10 NOTE — Discharge Summary (Signed)
Physician Discharge Summary  Connor Martin NUU:[id number] DOB: [birth year] DOA: 04/06/2016  PCP: Pcp Not In System  Admit date: 04/06/2016 Discharge date: 04/10/2016  Time spent: 35 minutes  Recommendations for Outpatient Follow-up:  Repeat CMET to follow electrolytes, renal function and LFT's Repeat triglycerides level and further adjust medications for lipid control    Discharge Diagnoses:    Pancreatitis, acute   High blood triglycerides   Panic attacks   GERD (gastroesophageal reflux disease)   Abnormal LFTs   Alcohol use (HCC)   Cough   Discharge Condition: stable and improved. Discharge home with instructions to follow low fat diet and to quit alcohol consumption. Will follow up with transition clinic initially and eventually with PCP.  Diet recommendation: low fat diet   Filed Weights   04/07/16 0404 04/09/16 0500 04/10/16 0400  Weight: 92.987 kg (205 lb) 97.614 kg (215 lb 3.2 oz) 100.154 kg (220 lb 12.8 oz)    History of present illness:  40 y.o. male with medical history significant of GERD, panic attack and alcohol use, who presented with nausea, vomiting, abdominal pain and cough. Patient reports that he has been having nausea, vomiting, abdominal pain for more than a week. His abdominal pain is located in the left upper quadrant, intermittent, 10 out of 10 in severity, nonradiating. It is not aggravated or alleviated by any known factors. He vomited 4-5 times today without blood in the vomitus. Patient states that he drinks Vodka 3-4 times per week. Per his girlfriend, patient has several episode of panic attack with headache, generalized weakness, anxiety. He had some chest pain and chest discomfort earlier, which has resolved. Currently no chest pain. Patient has dry cough for more than 2 weeks, no SOB. Patient does not have fever, but has chills. No diarrhea, symptoms of UTI or unilateral weakness.  Hospital Course:  Pancreatitis, acute: Patient's nausea, vomiting  and abdominal pain is most likely caused by acute pancreatitis given elevated lipase. Patient has abnormal liver function, but no gallstone by abdominal ultrasound. This is most likely due to alcoholic pancreatitis and hypertriglyceridemia .  -lipase level was > 3 times normal range (923), he had hypocalcemia and his TG were 7940. After plasmapheresis (one treatment; TG 325 and lipase 85). Patient was feeling much better and reported significant improvement in his abd pain. No further nausea and no vomiting. -able to tolerate full liquid diet and then low fat diet prior to discharge -advise to maintain himself well hydrated; to continue advancing diet slowly and to avoid ETOH. -appreciate assistance from nephrology with plasmapheresis treatment. 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 -hepatitis panel ordered on admission and neg -LFT's trending down and almost completely back to normal at discharge -alcohol cessation/abstinence encouraged  Alcohol use: -place on CIWA protocol -cessation counseling provided -no active withdrawal symptoms appreciated  Cough: Lungs clear on exam  -CXR w/o acute abnormalities -PRN flutter valve initiated   Procedures: See below for x-ray reports Plasmapheresis 7/15  Consultations:  Nephrology   Discharge Exam: Filed Vitals:   04/10/16 0754 04/10/16 1422  BP: 120/70 130/79  Pulse: 83 92  Temp: 98.1 F (36.7  C) 98 F (36.7 C)  Resp: 16 16    General: afebrile, with minimal abd pain and able to tolerate low fat diet, no  further nausea or vomiting.  No CP or SOB   Cardiovascular: S1 and S2, no rubs or gallops  Respiratory: good air movement, no wheezing, no crackles  Abdomen: soft, mild epigastric pain; no distension, positive BS  Musculoskeletal: no edema or cyanosis   Discharge Instructions   Discharge Instructions    Discharge instructions    Complete by:  As directed   Take medications as prescribed Stop alcohol consumption Follow low fat diet  Keep yourself well hydrated  Follow up at the transition clinic as instructed          Current Discharge Medication List    START taking these medications   Details  gemfibrozil (LOPID) 600 MG tablet Take 1 tablet (600 mg total) by mouth 2 (two) times daily before a meal. Qty: 60 tablet, Refills: 1    Multiple Vitamin (MULTIVITAMIN WITH MINERALS) TABS tablet Take 1 tablet by mouth daily.    omega-3 acid ethyl esters (LOVAZA) 1 g capsule Take 1 capsule (1 g total) by mouth 2 (two) times daily. Qty: 60 capsule, Refills: 1    oxyCODONE (OXY IR/ROXICODONE) 5 MG immediate release tablet Take 1-2 tablets (5-10 mg total) by mouth every 6 (six) hours as needed for severe pain. Qty: 20 tablet, Refills: 0      CONTINUE these medications which have NOT CHANGED   Details  ALPRAZolam (XANAX) 0.5 MG tablet Take 0.25 mg by mouth daily as needed for anxiety.    amphetamine-dextroamphetamine (ADDERALL) 20 MG tablet Take 20 mg by mouth 3 (three) times daily. Refills: 0    aspirin-acetaminophen-caffeine (EXCEDRIN MIGRAINE) 250-250-65 MG tablet Take 1 tablet by mouth 2 (two) times daily as needed for migraine.     cetirizine (ZYRTEC) 10 MG tablet Take 10 mg by mouth daily.    esomeprazole (NEXIUM) 40 MG capsule Take 40 mg by mouth daily.       Allergies  Allergen Reactions  . Imitrex [Sumatriptan] Swelling    Brain  . Latex Other (See Comments)    Does not prefer  . Benadryl [Diphenhydramine] Hives and Palpitations    unsure   Follow-up  Information    Follow up with [facility] On 05/12/2016.   Why:   @ 2:30 Hopsital follow Up With Sharon Seller.   Contact information:   [place] [phone]       The results of significant diagnostics from this hospitalization (including imaging, microbiology, ancillary and laboratory) are listed below for reference.    Significant Diagnostic Studies: Dg Chest 2 View  04/07/2016  CLINICAL DATA:  Cough, left lower chest pain EXAM: CHEST  2 VIEW COMPARISON:  None. FINDINGS: Lungs are clear.  No pleural effusion or pneumothorax. The heart is normal in size. Visualized osseous structures are within normal limits. IMPRESSION: Normal chest radiographs. Electronically Signed   By: Julian Hy M.D.   On: 04/07/2016 09:04   US Abdomen Limited  04/06/2016  CLINICAL DATA:  Panic attack, chest pain, nausea, vomiting, increased LFTs EXAM: US ABDOMEN LIMITED - RIGHT UPPER QUADRANT COMPARISON:  None. FINDINGS: Gallbladder: No gallstones or wall thickening visualized. No sonographic Murphy sign noted by sonographer. Common bile duct: Diameter: 4 mm Liver: No focal lesion identified. Increased hepatic parenchymal echogenicity. IMPRESSION: 1. No cholelithiasis or sonographic evidence of acute cholecystitis. 2. Increased  hepatic echogenicity as can be seen with hepatic steatosis. Electronically Signed   By: Kathreen Devoid   On: 04/06/2016 20:43   Ir Fluoro Guide Cv Line Right  04/08/2016  INDICATION: Concern for triglyceride related pancreatitis. Please place temporary dialysis catheter for the initiation of plasmapheresis. EXAM: NON-TUNNELED CENTRAL VENOUS HEMODIALYSIS CATHETER PLACEMENT WITH ULTRASOUND AND FLUOROSCOPIC GUIDANCE COMPARISON:  None. MEDICATIONS: None FLUOROSCOPY TIME:  42 seconds (5 mGy) COMPLICATIONS: None immediate. PROCEDURE: Informed written consent was obtained from the patient after a discussion of the risks, benefits, and alternatives  to treatment. Questions regarding the procedure were encouraged and answered. The right neck and chest were prepped with chlorhexidine in a sterile fashion, and a sterile drape was applied covering the operative field. Maximum barrier sterile technique with sterile gowns and gloves were used for the procedure. A timeout was performed prior to the initiation of the procedure. After creating a small venotomy incision, a micropuncture kit was utilized to access the right internal jugular vein under direct, real-time ultrasound guidance after the overlying soft tissues were anesthetized with 1% lidocaine with epinephrine. Ultrasound image documentation was performed. The microwire was kinked to measure appropriate catheter length. A stiff glidewire was advanced to the level of the IVC. Under fluoroscopic guidance, the venotomy was serially dilated, ultimately allowing placement of a 20 cm temporary Mahurkar catheter with tip ultimately terminating within the superior aspect of the right atrium. Final catheter positioning was confirmed and documented with a spot radiographic image. The catheter aspirates and flushes normally. The catheter was flushed with appropriate volume heparin dwells. The catheter exit site was secured with a 0-Prolene retention suture. A dressing was placed. The patient tolerated the procedure well without immediate post procedural complication. IMPRESSION: Successful placement of a right internal jugular approach 20 cm temporary dialysis catheter with tip terminating with in the superior aspect of the right atrium. The catheter is ready for immediate use. PLAN: This catheter may be converted to a tunneled dialysis catheter at a later date as indicated. Electronically Signed   By: Sandi Mariscal M.D.   On: 04/08/2016 11:10   Ir US Guide Vasc Access Right  04/08/2016  INDICATION: Concern for triglyceride related pancreatitis. Please place temporary dialysis catheter for the initiation of  plasmapheresis. EXAM: NON-TUNNELED CENTRAL VENOUS HEMODIALYSIS CATHETER PLACEMENT WITH ULTRASOUND AND FLUOROSCOPIC GUIDANCE COMPARISON:  None. MEDICATIONS: None FLUOROSCOPY TIME:  42 seconds (5 mGy) COMPLICATIONS: None immediate. PROCEDURE: Informed written consent was obtained from the patient after a discussion of the risks, benefits, and alternatives to treatment. Questions regarding the procedure were encouraged and answered. The right neck and chest were prepped with chlorhexidine in a sterile fashion, and a sterile drape was applied covering the operative field. Maximum barrier sterile technique with sterile gowns and gloves were used for the procedure. A timeout was performed prior to the initiation of the procedure. After creating a small venotomy incision, a micropuncture kit was utilized to access the right internal jugular vein under direct, real-time ultrasound guidance after the overlying soft tissues were anesthetized with 1% lidocaine with epinephrine. Ultrasound image documentation was performed. The microwire was kinked to measure appropriate catheter length. A stiff glidewire was advanced to the level of the IVC. Under fluoroscopic guidance, the venotomy was serially dilated, ultimately allowing placement of a 20 cm temporary Mahurkar catheter with tip ultimately terminating within the superior aspect of the right atrium. Final catheter positioning was confirmed and documented with a spot radiographic image. The catheter aspirates and flushes normally. The  catheter was flushed with appropriate volume heparin dwells. The catheter exit site was secured with a 0-Prolene retention suture. A dressing was placed. The patient tolerated the procedure well without immediate post procedural complication. IMPRESSION: Successful placement of a right internal jugular approach 20 cm temporary dialysis catheter with tip terminating with in the superior aspect of the right atrium. The catheter is ready for  immediate use. PLAN: This catheter may be converted to a tunneled dialysis catheter at a later date as indicated. Electronically Signed   By: Sandi Mariscal M.D.   On: 04/08/2016 11:10    Microbiology: No results found for this or any previous visit (from the past 240 hour(s)).   Labs: Basic Metabolic Panel:  Recent Labs Lab 04/07/16 0850 04/07/16 1215 04/08/16 0637 04/08/16 1617 04/09/16 0345 04/10/16 0500  NA 133* 134* 134* 136 139 137  K 3.4* 3.7 4.2 3.2* 3.6 3.8  CL 97* 98* 98* 100* 109 108  CO2 23 24 23   --  25 23  GLUCOSE 111* 124* 109* 111* 95 123*  BUN 12 12 5* <3* <5* <5*  CREATININE 1.06 1.04 0.97 0.90 0.99 0.94  CALCIUM 7.8* 7.8* 7.8*  --  7.0* 7.7*  MG  --   --   --   --   --  2.1   Liver Function Tests:  Recent Labs Lab 04/06/16 1619 04/07/16 0435 04/08/16 0637 04/09/16 0345 04/10/16 0500  AST 251* 196* 129* 47* 74*  ALT 157* 128* 96* 30 46  ALKPHOS 216* 190* 197* 63 90  BILITOT 1.7* 1.8* 3.0* 2.2* 2.2*  PROT 6.6 5.9* 5.5* 4.5* 5.2*  ALBUMIN 3.2* 2.9* 2.6* 3.6 3.6    Recent Labs Lab 04/06/16 1619 04/08/16 0637 04/09/16 0345 04/10/16 0500  LIPASE 923* 154* 87* 85*   CBC:  Recent Labs Lab 04/06/16 1619 04/07/16 0435 04/08/16 1617 04/09/16 0345  WBC 11.5* 10.2  --  7.8  HGB 14.8 13.8 12.2* 10.7*  HCT 40.0 42.3 36.0* 32.8*  MCV 91.3 96.6  --  97.9  PLT 270 162  --  133*   CBG:  Recent Labs Lab 04/09/16 0715 04/09/16 1106 04/09/16 1641 04/09/16 2108 04/10/16 0752  GLUCAP 94 120* 94 117* 128*    Signed:  Barton Dubois MD.  [facility] 04/10/2016, 3:25 PM

## 2016-04-10 NOTE — Progress Notes (Signed)
Nutrition Education Note  RD consulted for nutrition education regarding a low fat diet for pancreatitis. Patient reports that he follows "the high metabolism diet" which is very low in fat.  Lipid Panel     Component Value Date/Time   CHOL 777* 04/07/2016 0435   TRIG 325* 04/10/2016 0500   HDL NOT REPORTED DUE TO HIGH TRIGLYCERIDES 04/07/2016 0435   CHOLHDL NOT REPORTED DUE TO HIGH TRIGLYCERIDES 04/07/2016 0435   VLDL UNABLE TO CALCULATE IF TRIGLYCERIDE OVER 400 mg/dL 16/10/960407/14/2017 [id number]   LDLCALC UNABLE TO CALCULATE IF TRIGLYCERIDE OVER 400 mg/dL 81/19/147807/14/2017 [id number]    RD provided "Pancreatitis Nutrition Therapy" handout from the [employer]. Reviewed patient's dietary recall. Provided examples on ways to decrease fat intake in diet. Discouraged intake of processed and fried foods. Encouraged fresh fruits and vegetables as well as whole grain sources of carbohydrates to maximize fiber intake. Teach back method used.  Expect good compliance.  Body mass index is 28.34 kg/(m^2). Pt meets criteria for overweight based on current BMI.  Current diet order is heart healthy, patient is consuming approximately 75% of meals at this time. Labs and medications reviewed. No further nutrition interventions warranted at this time. RD contact information provided. If additional nutrition issues arise, please re-consult RD.  Joaquin CourtsKimberly Kortland Nichols, RD, LDN, CNSC Pager [phone] After Hours Pager [phone]

## 2016-04-10 NOTE — Progress Notes (Signed)
Patient ID: Connor Martin, male   DOB: 07-16-1976, 40 y.o.   MRN: [id number] S: feeling better O:BP 120/70 mmHg  Pulse 83  Temp(Src) 98.1 F (36.7 C) (Oral)  Resp 16  Ht _0  (1.88 m)  Wt 100.154 kg (220 lb 12.8 oz)  BMI 28.34 kg/m2  SpO2 99%  Intake/Output Summary (Last 24 hours) at 04/10/16 0857 Last data filed at 04/10/16 0755  Gross per 24 hour  Intake   3870 ml  Output    500 ml  Net   3370 ml   Intake/Output: I/O last 3 completed shifts: In: 5929 [P.O.:1540; I.V.:2400; IV Piggyback:50] Out: -   Intake/Output this shift:  Total I/O In: -  Out: 500 [Urine:500] Weight change: 2.54 kg (5 lb 9.6 oz) Gen:WD WN WM in NAD CVS:no rub Resp:cta WKM:QKMMNO Ext:no edema   Recent Labs Lab 04/06/16 1619 04/07/16 0435 04/07/16 0850 04/07/16 1215 04/08/16 0637 04/09/16 0345 04/10/16 0500  NA 138 135 133* 134* 134* 139 137  K 4.0 3.6 3.4* 3.7 4.2 3.6 3.8  CL 100* 101 97* 98* 98* 109 108  CO2 _1 GLUCOSE 125* 84 111* 124* 109* 95 123*  BUN _2 5* <5* <5*  CREATININE 1.05 1.00 1.06 1.04 0.97 0.99 0.94  ALBUMIN 3.2* 2.9*  --   --  2.6* 3.6 3.6  CALCIUM 8.9 7.7* 7.8* 7.8* 7.8* 7.0* 7.7*  AST 251* 196*  --   --  129* 47* 74*  ALT 157* 128*  --   --  96* 30 46   Liver Function Tests:  Recent Labs Lab 04/08/16 0637 04/09/16 0345 04/10/16 0500  AST 129* 47* 74*  ALT 96* 30 46  ALKPHOS 197* 63 90  BILITOT 3.0* 2.2* 2.2*  PROT 5.5* 4.5* 5.2*  ALBUMIN 2.6* 3.6 3.6    Recent Labs Lab 04/08/16 0637 04/09/16 0345 04/10/16 0500  LIPASE 154* 87* 85*   No results for input(s): AMMONIA in the last 168 hours. CBC:  Recent Labs Lab 04/06/16 1619 04/07/16 0435 04/09/16 0345  WBC 11.5* 10.2 7.8  HGB 14.8 13.8 10.7*  HCT 40.0 42.3 32.8*  MCV 91.3 96.6 97.9  PLT 270 162 133*   Cardiac Enzymes: No results for input(s): CKTOTAL, CKMB, CKMBINDEX, TROPONINI in the last 168 hours. CBG:  Recent Labs Lab 04/09/16 0715 04/09/16 1106  04/09/16 1641 04/09/16 2108 04/10/16 0752  GLUCAP 94 120* 94 117* 128*    Iron Studies: No results for input(s): IRON, TIBC, TRANSFERRIN, FERRITIN in the last 72 hours. Studies/Results: Ir Fluoro Guide Cv Line Right  04/08/2016  INDICATION: Concern for triglyceride related pancreatitis. Please place temporary dialysis catheter for the initiation of plasmapheresis. EXAM: NON-TUNNELED CENTRAL VENOUS HEMODIALYSIS CATHETER PLACEMENT WITH ULTRASOUND AND FLUOROSCOPIC GUIDANCE COMPARISON:  None. MEDICATIONS: None FLUOROSCOPY TIME:  42 seconds (5 mGy) COMPLICATIONS: None immediate. PROCEDURE: Informed written consent was obtained from the patient after a discussion of the risks, benefits, and alternatives to treatment. Questions regarding the procedure were encouraged and answered. The right neck and chest were prepped with chlorhexidine in a sterile fashion, and a sterile drape was applied covering the operative field. Maximum barrier sterile technique with sterile gowns and gloves were used for the procedure. A timeout was performed prior to the initiation of the procedure. After creating a small venotomy incision, a micropuncture kit was utilized to access the right internal jugular vein under direct, real-time ultrasound guidance after the overlying soft tissues were  anesthetized with 1% lidocaine with epinephrine. Ultrasound image documentation was performed. The microwire was kinked to measure appropriate catheter length. A stiff glidewire was advanced to the level of the IVC. Under fluoroscopic guidance, the venotomy was serially dilated, ultimately allowing placement of a 20 cm temporary Mahurkar catheter with tip ultimately terminating within the superior aspect of the right atrium. Final catheter positioning was confirmed and documented with a spot radiographic image. The catheter aspirates and flushes normally. The catheter was flushed with appropriate volume heparin dwells. The catheter exit site was  secured with a 0-Prolene retention suture. A dressing was placed. The patient tolerated the procedure well without immediate post procedural complication. IMPRESSION: Successful placement of a right internal jugular approach 20 cm temporary dialysis catheter with tip terminating with in the superior aspect of the right atrium. The catheter is ready for immediate use. PLAN: This catheter may be converted to a tunneled dialysis catheter at a later date as indicated. Electronically Signed   By: Sandi Mariscal M.D.   On: 04/08/2016 11:10   Ir US Guide Vasc Access Right  04/08/2016  INDICATION: Concern for triglyceride related pancreatitis. Please place temporary dialysis catheter for the initiation of plasmapheresis. EXAM: NON-TUNNELED CENTRAL VENOUS HEMODIALYSIS CATHETER PLACEMENT WITH ULTRASOUND AND FLUOROSCOPIC GUIDANCE COMPARISON:  None. MEDICATIONS: None FLUOROSCOPY TIME:  42 seconds (5 mGy) COMPLICATIONS: None immediate. PROCEDURE: Informed written consent was obtained from the patient after a discussion of the risks, benefits, and alternatives to treatment. Questions regarding the procedure were encouraged and answered. The right neck and chest were prepped with chlorhexidine in a sterile fashion, and a sterile drape was applied covering the operative field. Maximum barrier sterile technique with sterile gowns and gloves were used for the procedure. A timeout was performed prior to the initiation of the procedure. After creating a small venotomy incision, a micropuncture kit was utilized to access the right internal jugular vein under direct, real-time ultrasound guidance after the overlying soft tissues were anesthetized with 1% lidocaine with epinephrine. Ultrasound image documentation was performed. The microwire was kinked to measure appropriate catheter length. A stiff glidewire was advanced to the level of the IVC. Under fluoroscopic guidance, the venotomy was serially dilated, ultimately allowing placement  of a 20 cm temporary Mahurkar catheter with tip ultimately terminating within the superior aspect of the right atrium. Final catheter positioning was confirmed and documented with a spot radiographic image. The catheter aspirates and flushes normally. The catheter was flushed with appropriate volume heparin dwells. The catheter exit site was secured with a 0-Prolene retention suture. A dressing was placed. The patient tolerated the procedure well without immediate post procedural complication. IMPRESSION: Successful placement of a right internal jugular approach 20 cm temporary dialysis catheter with tip terminating with in the superior aspect of the right atrium. The catheter is ready for immediate use. PLAN: This catheter may be converted to a tunneled dialysis catheter at a later date as indicated. Electronically Signed   By: Sandi Mariscal M.D.   On: 04/08/2016 11:10   . therapeutic plasma exchange solution   Dialysis Once in dialysis  . amphetamine-dextroamphetamine  20 mg Oral TID  . dextromethorphan-guaiFENesin  1 tablet Oral BID  . enoxaparin (LOVENOX) injection  40 mg Subcutaneous Q24H  . folic acid  1 mg Oral Daily  . gemfibrozil  600 mg Oral BID AC  . heparin  1,000 Units Intracatheter Once  . loratadine  10 mg Oral Daily  . LORazepam  0-4 mg Intravenous Q12H  .  multivitamin with minerals  1 tablet Oral Daily  . omega-3 acid ethyl esters  1 g Oral BID  . pantoprazole  40 mg Oral Daily  . sodium chloride  1,000 mL Intravenous Once  . thiamine  100 mg Oral Daily   Or  . thiamine  100 mg Intravenous Daily    BMET    Component Value Date/Time   NA 137 04/10/2016 0500   K 3.8 04/10/2016 0500   CL 108 04/10/2016 0500   CO2 23 04/10/2016 0500   GLUCOSE 123* 04/10/2016 0500   BUN <5* 04/10/2016 0500   CREATININE 0.94 04/10/2016 0500   CALCIUM 7.7* 04/10/2016 0500   GFRNONAA >60 04/10/2016 0500   GFRAA >60 04/10/2016 0500   CBC    Component Value Date/Time   WBC 7.8 04/09/2016  0345   RBC 3.35* 04/09/2016 0345   HGB 10.7* 04/09/2016 0345   HCT 32.8* 04/09/2016 0345   PLT 133* 04/09/2016 0345   MCV 97.9 04/09/2016 0345   MCH 31.9 04/09/2016 0345   MCHC 32.6 04/09/2016 0345   RDW 15.0 04/09/2016 0345     Assessment/Plan: 1. hypertriglyceridemia-induced acute pancreatitis. Discussed case with Dr. Dyann Kief as well as Mr. Adami and his mother and explained that given his markedly elevated triglycerides and lipase, as well as hypocalcemia, he would benefit from initiation of plasmapheresis to help reduce his hypertriglyceridemia and hopefully prevent further pancreatic injury and complications. He tolerated the procedure well with marked improvement in his triglyceride level from >5000 to only 224.    1. Marked improvement of triglycerides with only one treatment. No further treatments at this time. 2. Continue to monitor triglycerides and cont with lipid control. 3. Will need to f/u with GI for ongoing evaluation and therapy maintenance 4. Ok to use trialysis catheter pigtail for IVF's and meds.  5. Triglycerides up slightly from 224 to 325.  Recommend GI consult to help with outpatient management. 6. Hold off on further pheresis. 2. HTN- stable 3. Hypocalcemia- due to acute pancreatitis. 4. Abnormal LFT's- presumably due to pancreatitis. Improving. 5. Oliguria- due to volume depletion/third spacing from acute pancreatitis (severe). Follow I's/o's as Scr improving. 6. Hypoalbuminemia- improving. 7. Vascular access- discontinue HD cath. 8. Disposition- for probable discharge later today if tolerates lunch.  Will need to establish PCP here to help monitor lipid panel and adjust meds.  Will sign off.  No need to f/u with our office as an outpatient.  [place]

## 2016-04-10 NOTE — Discharge Instructions (Signed)

## 2016-04-10 NOTE — Progress Notes (Signed)
Discharge paperwork gone over with patient and mother. All questions answered to patient satisfaction. Telemetry discontinued. Patient discharged to home with family by way of wheelchair.

## 2016-04-10 NOTE — Care Management Note (Signed)
Case Management Note  Patient Details  Name: Connor Martin MRN: [id number] Date of Birth: Feb 08, 1976  Subjective/Objective:  Pt presented for pancreatitis. Pt is from [place] with wife and they have relocated. Pt needs PCP in [place].                   Action/Plan: CM [facility]. Placed on AVS form. CM did make pt aware that he could call his insurance provider and they can make him aware of PCP's in the area. No further needs from CM at this time.   Expected Discharge Date:                  Expected Discharge Plan:  Home/Self Care  In-House Referral:  NA  Discharge planning Services  CM Consult, Follow-up appt scheduled  Post Acute Care Choice:  NA Choice offered to:  NA  DME Arranged:  N/A DME Agency:  NA  HH Arranged:  NA HH Agency:  NA  Status of Service:  Completed, signed off  If discussed at Long Length of Stay Meetings, dates discussed:    Additional Comments:  Gala LewandowskyGraves-Bigelow, Glynnis Gavel Kaye, RN 04/10/2016, 11:36 AM

## 2016-04-14 ENCOUNTER — Encounter: Payer: Self-pay | Admitting: Family Medicine

## 2016-04-28 ENCOUNTER — Telehealth: Payer: Self-pay | Admitting: Gastroenterology

## 2016-04-28 NOTE — Telephone Encounter (Signed)
Left message on machine to call back  

## 2016-04-28 NOTE — Telephone Encounter (Signed)
The pt has been notified that Dr Christella Hartigan is out of the office until Wed, The pt  will have records from [facility] faxed for review.  Dr Christella Hartigan please advise
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
## 2016-05-06 ENCOUNTER — Other Ambulatory Visit: Payer: Self-pay | Admitting: Internal Medicine

## 2016-05-06 DIAGNOSIS — R1010 Upper abdominal pain, unspecified: Secondary | ICD-10-CM

## 2016-05-09 ENCOUNTER — Other Ambulatory Visit: Payer: Self-pay | Admitting: Internal Medicine

## 2016-05-09 ENCOUNTER — Telehealth: Payer: Self-pay | Admitting: Gastroenterology

## 2016-05-09 DIAGNOSIS — K858 Other acute pancreatitis without necrosis or infection: Secondary | ICD-10-CM

## 2016-05-09 NOTE — Telephone Encounter (Signed)
The pt was notified that we have not received any records from [place].  I advised him I would put them on Dr Christella HartiganJacobs desk for review as soon as received.  He was also notified that the ED note made no mention of EUS.

## 2016-05-11 ENCOUNTER — Telehealth: Payer: Self-pay | Admitting: Gastroenterology

## 2016-05-11 NOTE — Telephone Encounter (Signed)
The pt has been notified we do have his records and they are on Dr Christella HartiganJacobs desk for review.  I will call him as soon as I have Dr Christella HartiganJacobs recommendations.

## 2016-05-12 ENCOUNTER — Ambulatory Visit: Payer: BLUE CROSS/BLUE SHIELD | Admitting: Family Medicine

## 2016-05-12 ENCOUNTER — Ambulatory Visit
Admission: RE | Admit: 2016-05-12 | Discharge: 2016-05-12 | Disposition: A | Discharge status: Home / Self Care | Payer: BLUE CROSS/BLUE SHIELD | Source: Ambulatory Visit | Attending: Internal Medicine | Admitting: Internal Medicine

## 2016-05-12 DIAGNOSIS — K858 Other acute pancreatitis without necrosis or infection: Secondary | ICD-10-CM

## 2016-05-12 MED ORDER — IOPAMIDOL (ISOVUE-300) INJECTION 61%
100.0000 mL | Freq: Once | INTRAVENOUS | Status: AC | PRN
  Administered 2016-05-12: 100 mL via INTRAVENOUS

## 2016-08-30 DIAGNOSIS — Z8659 Personal history of other mental and behavioral disorders: Secondary | ICD-10-CM | POA: Insufficient documentation

## 2016-08-30 DIAGNOSIS — F411 Generalized anxiety disorder: Secondary | ICD-10-CM | POA: Insufficient documentation

## 2016-09-02 DIAGNOSIS — K760 Fatty (change of) liver, not elsewhere classified: Secondary | ICD-10-CM | POA: Insufficient documentation

## 2017-08-23 IMAGING — CR DG CHEST 2V
2 series · 2 of 2 positions shown · non-contrast
Comparison: None.

CLINICAL DATA: Cough, left lower chest pain

EXAM:
CHEST  2 VIEW

[chest pa]
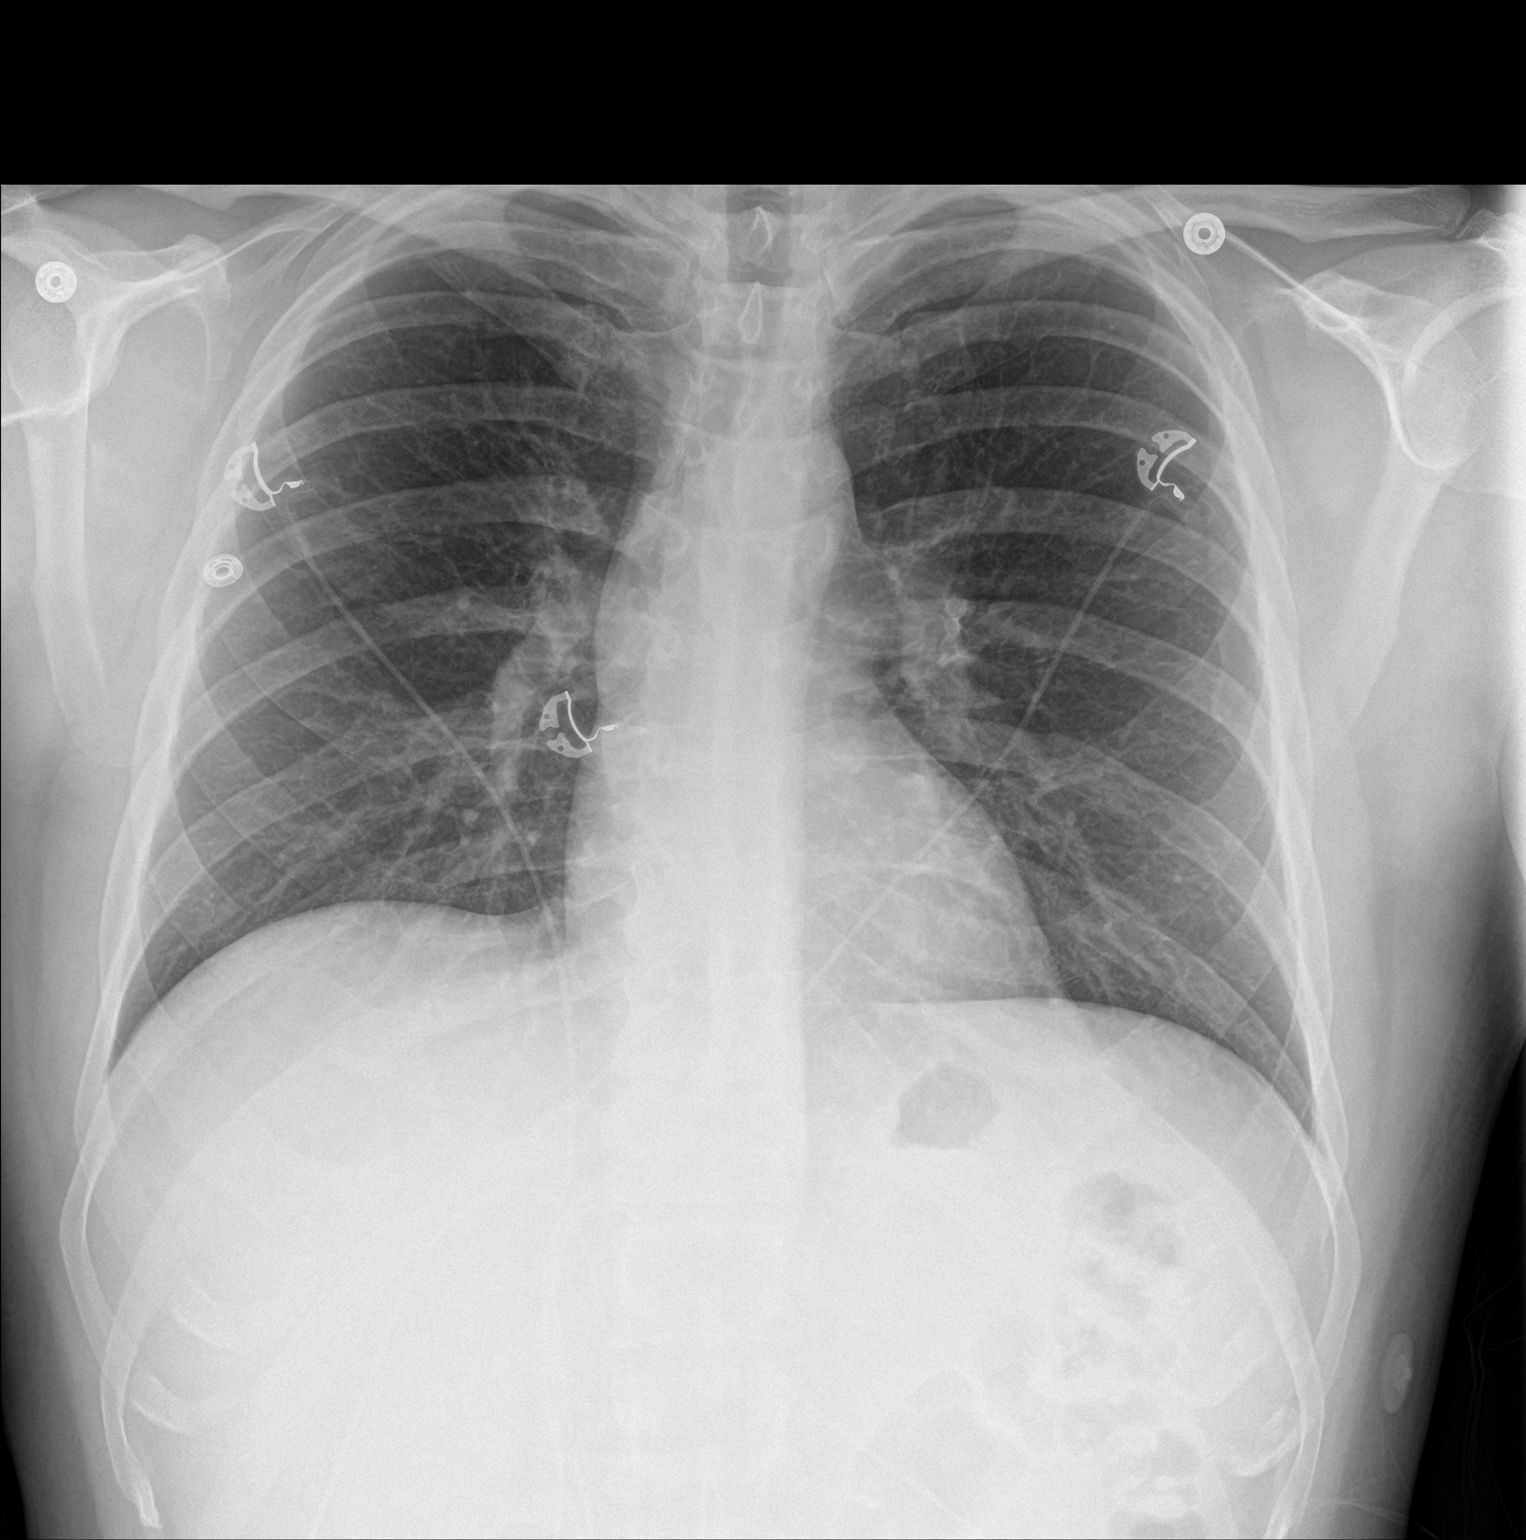

[chest lat]
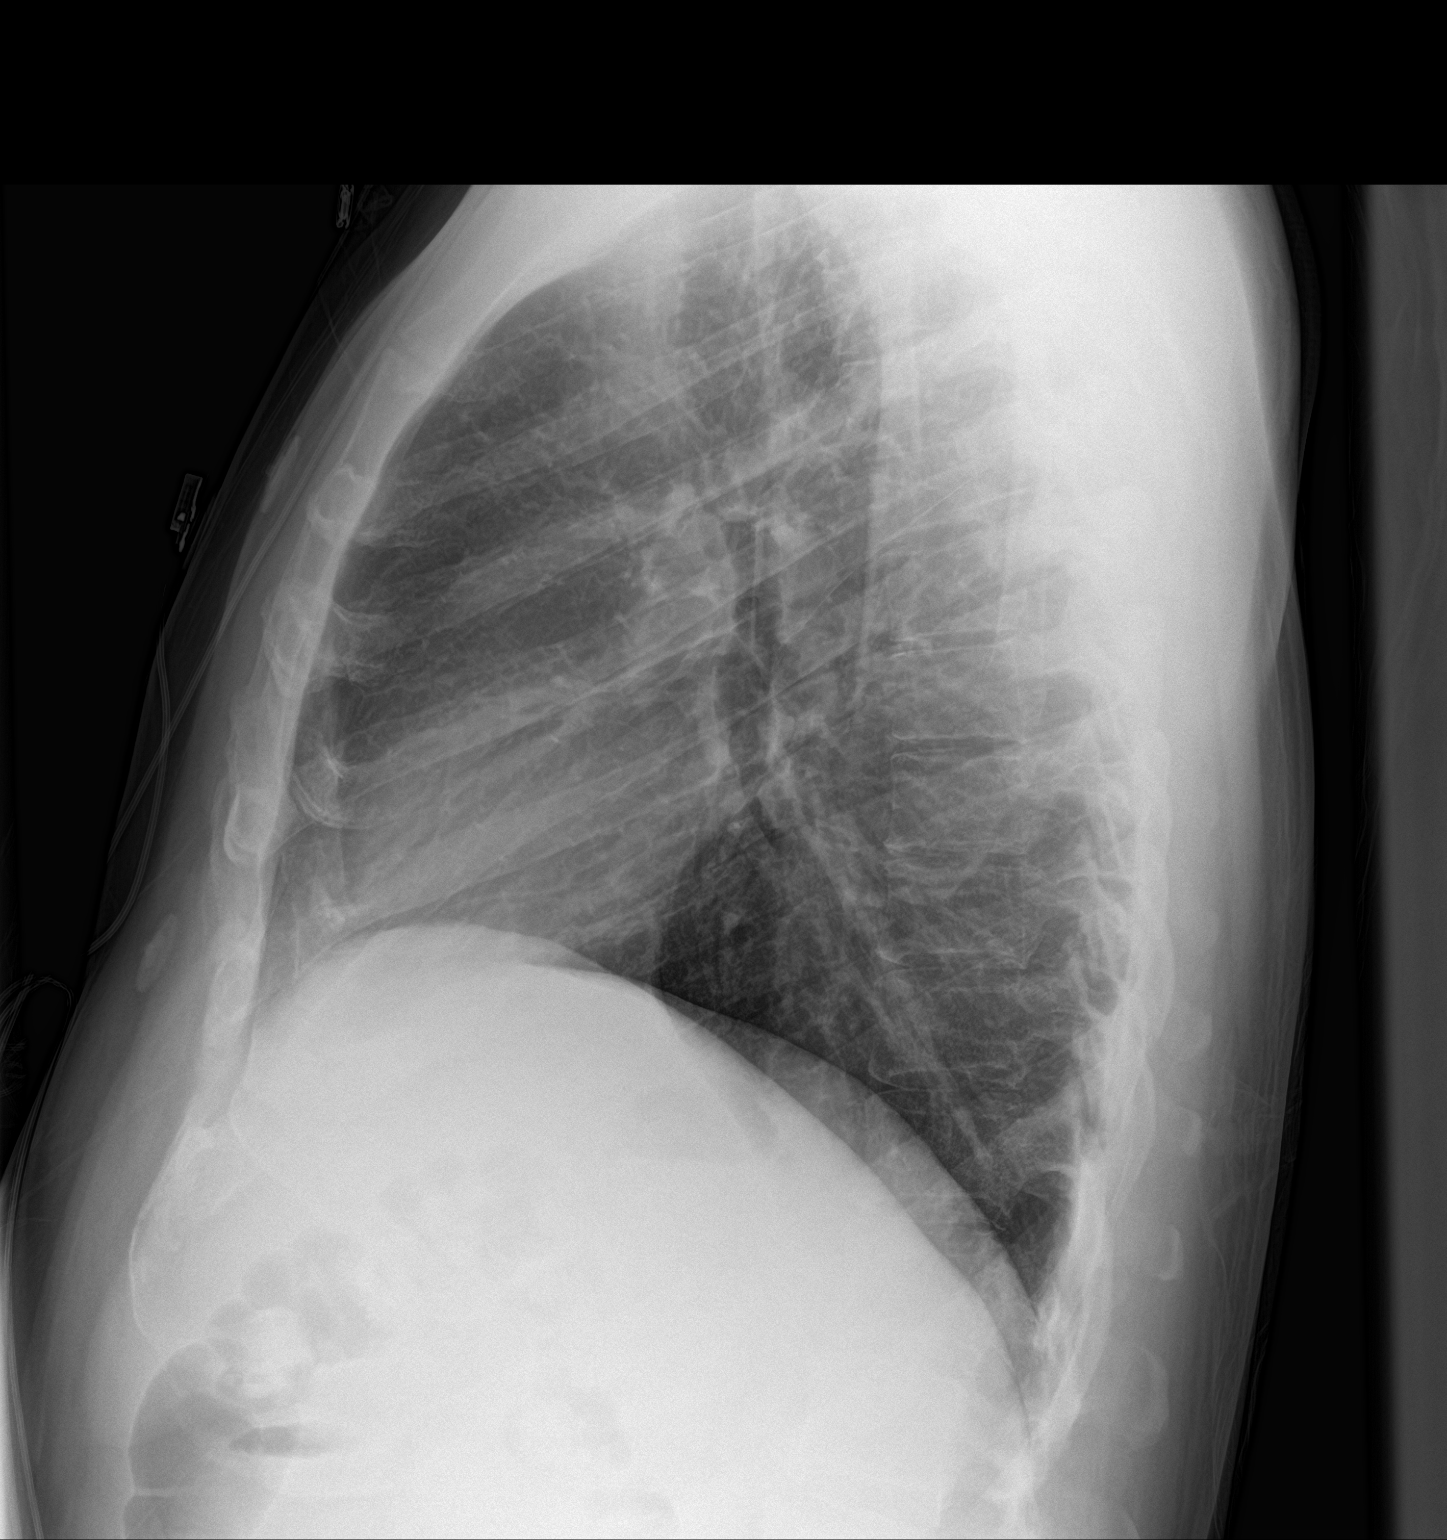

[2 of 2 positions shown; findings below may reference images not displayed]

FINDINGS: Lungs are clear.  No pleural effusion or pneumothorax.

The heart is normal in size.

Visualized osseous structures are within normal limits.
IMPRESSION: Normal chest radiographs.

## 2017-08-24 IMAGING — US IR US GUIDE VASC ACCESS RIGHT
1 series · 1 of 1 positions shown · non-contrast
Comparison: None.

INDICATION: Concern for triglyceride related pancreatitis. Please place
temporary dialysis catheter for the initiation of plasmapheresis.

EXAM:
NON-TUNNELED CENTRAL VENOUS HEMODIALYSIS CATHETER PLACEMENT WITH
ULTRASOUND AND FLUOROSCOPIC GUIDANCE

[Series 1: ir fluoro/shunt/fist · 1 of 1 slices shown]
[im 1/1]
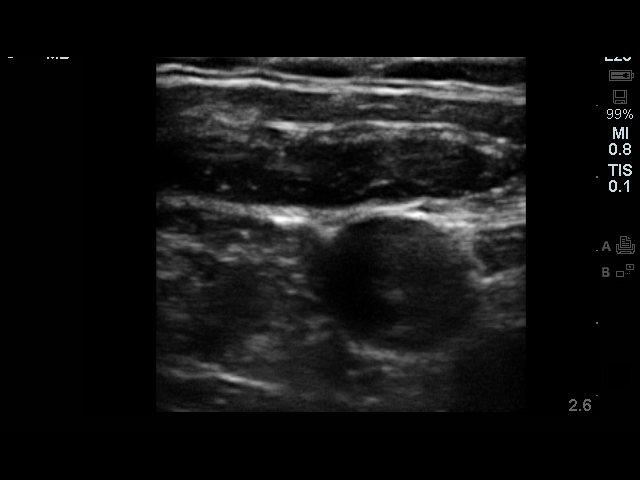

[1 of 1 positions shown; findings below may reference images not displayed]

MEDICATIONS:
None

FLUOROSCOPY TIME:  42 seconds (5 mGy)

COMPLICATIONS:
None immediate.



After creating a small venotomy incision, a micropuncture kit was
utilized to access the right internal jugular vein under direct,
real-time ultrasound guidance after the overlying soft tissues were
anesthetized with 1% lidocaine with epinephrine. Ultrasound image
documentation was performed. The microwire was kinked to measure
appropriate catheter length. A stiff glidewire was advanced to the
level of the IVC. Under fluoroscopic guidance, the venotomy was
serially dilated, ultimately allowing placement of a 20 cm temporary
Mahurkar catheter with tip ultimately terminating within the
superior aspect of the right atrium. Final catheter positioning was
confirmed and documented with a spot radiographic image. The
catheter aspirates and flushes normally. The catheter was flushed
with appropriate volume heparin dwells.

The catheter exit site was secured with a 0-Prolene retention
suture. A dressing was placed. The patient tolerated the procedure
well without immediate post procedural complication.
IMPRESSION: Successful placement of a right internal jugular approach 20 cm
temporary dialysis catheter with tip terminating with in the
superior aspect of the right atrium. The catheter is ready for
immediate use.

PLAN:
This catheter may be converted to a tunneled dialysis catheter at a
later date as indicated.

## 2018-04-09 ENCOUNTER — Other Ambulatory Visit: Payer: Self-pay | Admitting: Internal Medicine

## 2018-04-09 DIAGNOSIS — R109 Unspecified abdominal pain: Secondary | ICD-10-CM

## 2018-04-09 DIAGNOSIS — M5442 Lumbago with sciatica, left side: Secondary | ICD-10-CM

## 2018-04-10 ENCOUNTER — Other Ambulatory Visit: Payer: Self-pay | Admitting: Internal Medicine

## 2018-04-10 DIAGNOSIS — K759 Inflammatory liver disease, unspecified: Secondary | ICD-10-CM

## 2018-10-29 ENCOUNTER — Other Ambulatory Visit: Payer: Self-pay | Admitting: Internal Medicine

## 2018-10-29 DIAGNOSIS — K759 Inflammatory liver disease, unspecified: Secondary | ICD-10-CM

## 2018-10-30 ENCOUNTER — Ambulatory Visit
Admission: RE | Admit: 2018-10-30 | Discharge: 2018-10-30 | Disposition: A | Discharge status: Home / Self Care | Payer: BLUE CROSS/BLUE SHIELD | Source: Ambulatory Visit | Attending: Internal Medicine | Admitting: Internal Medicine

## 2018-10-30 DIAGNOSIS — K759 Inflammatory liver disease, unspecified: Secondary | ICD-10-CM

## 2018-11-05 ENCOUNTER — Other Ambulatory Visit: Payer: BLUE CROSS/BLUE SHIELD

## 2019-05-20 ENCOUNTER — Other Ambulatory Visit: Payer: Self-pay | Admitting: Internal Medicine

## 2019-05-23 ENCOUNTER — Other Ambulatory Visit: Payer: Self-pay | Admitting: Internal Medicine

## 2019-05-23 DIAGNOSIS — R5381 Other malaise: Secondary | ICD-10-CM

## 2019-05-26 ENCOUNTER — Other Ambulatory Visit: Payer: Self-pay | Admitting: Internal Medicine

## 2019-05-26 DIAGNOSIS — R109 Unspecified abdominal pain: Secondary | ICD-10-CM

## 2019-06-10 ENCOUNTER — Other Ambulatory Visit: Payer: Self-pay | Admitting: Internal Medicine

## 2019-06-10 DIAGNOSIS — M545 Low back pain, unspecified: Secondary | ICD-10-CM

## 2019-07-05 ENCOUNTER — Other Ambulatory Visit: Payer: 59

## 2019-07-07 ENCOUNTER — Ambulatory Visit
Admission: RE | Admit: 2019-07-07 | Discharge: 2019-07-07 | Disposition: A | Discharge status: Home / Self Care | Payer: 59 | Source: Ambulatory Visit | Attending: Internal Medicine | Admitting: Internal Medicine

## 2019-07-07 DIAGNOSIS — M545 Low back pain, unspecified: Secondary | ICD-10-CM

## 2019-07-10 ENCOUNTER — Other Ambulatory Visit: Payer: 59

## 2020-01-21 ENCOUNTER — Encounter: Payer: Self-pay | Admitting: Internal Medicine

## 2020-02-03 ENCOUNTER — Ambulatory Visit: Payer: 59 | Admitting: Dermatology

## 2020-02-25 DIAGNOSIS — E785 Hyperlipidemia, unspecified: Secondary | ICD-10-CM

## 2020-02-25 DIAGNOSIS — F4321 Adjustment disorder with depressed mood: Secondary | ICD-10-CM | POA: Insufficient documentation

## 2020-02-25 DIAGNOSIS — D539 Nutritional anemia, unspecified: Secondary | ICD-10-CM

## 2020-02-25 DIAGNOSIS — F900 Attention-deficit hyperactivity disorder, predominantly inattentive type: Secondary | ICD-10-CM

## 2020-02-27 ENCOUNTER — Ambulatory Visit: Payer: 59 | Admitting: Nurse Practitioner

## 2020-07-23 ENCOUNTER — Telehealth: Payer: Self-pay | Admitting: General Practice

## 2020-07-23 ENCOUNTER — Other Ambulatory Visit: Payer: Self-pay | Admitting: Radiology

## 2020-07-23 DIAGNOSIS — R002 Palpitations: Secondary | ICD-10-CM

## 2020-07-23 NOTE — Telephone Encounter (Signed)
NOTES ON FILE FROM [facility]. [phone]. REFERRAL SENT TO SCHEDULING.

## 2020-07-23 NOTE — Telephone Encounter (Signed)
Patient states a heart monitor order was sent to the office this morning for him.

## 2020-07-23 NOTE — Telephone Encounter (Signed)
Returned call to patient. We have received the referral for his Event Monitor from his primary care doctor. Briefly went over instructions and he knows to expect the monitor to arrive in 5-6 days

## 2020-08-04 ENCOUNTER — Ambulatory Visit (INDEPENDENT_AMBULATORY_CARE_PROVIDER_SITE_OTHER): Payer: BC Managed Care – PPO

## 2020-08-04 DIAGNOSIS — R002 Palpitations: Secondary | ICD-10-CM

## 2020-12-02 ENCOUNTER — Other Ambulatory Visit: Payer: Self-pay | Admitting: Gastroenterology

## 2020-12-02 DIAGNOSIS — Z8719 Personal history of other diseases of the digestive system: Secondary | ICD-10-CM

## 2020-12-20 ENCOUNTER — Other Ambulatory Visit: Payer: BC Managed Care – PPO

## 2021-01-04 ENCOUNTER — Ambulatory Visit
Admission: RE | Admit: 2021-01-04 | Discharge: 2021-01-04 | Disposition: A | Discharge status: Home / Self Care | Payer: BC Managed Care – PPO | Source: Ambulatory Visit | Attending: Gastroenterology | Admitting: Gastroenterology

## 2021-01-04 DIAGNOSIS — Z8719 Personal history of other diseases of the digestive system: Secondary | ICD-10-CM

## 2021-01-04 MED ORDER — IOPAMIDOL (ISOVUE-300) INJECTION 61%
100.0000 mL | Freq: Once | INTRAVENOUS | Status: AC | PRN
  Administered 2021-01-04: 100 mL via INTRAVENOUS

## 2021-04-15 ENCOUNTER — Ambulatory Visit: Payer: BC Managed Care – PPO | Admitting: Internal Medicine

## 2021-06-24 DIAGNOSIS — E785 Hyperlipidemia, unspecified: Secondary | ICD-10-CM | POA: Diagnosis not present

## 2021-06-24 DIAGNOSIS — M545 Low back pain, unspecified: Secondary | ICD-10-CM | POA: Diagnosis not present

## 2021-06-24 DIAGNOSIS — F419 Anxiety disorder, unspecified: Secondary | ICD-10-CM | POA: Diagnosis not present

## 2021-06-24 DIAGNOSIS — R739 Hyperglycemia, unspecified: Secondary | ICD-10-CM | POA: Diagnosis not present

## 2021-06-24 DIAGNOSIS — F900 Attention-deficit hyperactivity disorder, predominantly inattentive type: Secondary | ICD-10-CM | POA: Diagnosis not present

## 2021-06-24 DIAGNOSIS — K760 Fatty (change of) liver, not elsewhere classified: Secondary | ICD-10-CM | POA: Diagnosis not present

## 2021-06-24 DIAGNOSIS — Z79899 Other long term (current) drug therapy: Secondary | ICD-10-CM | POA: Diagnosis not present

## 2021-06-24 DIAGNOSIS — K861 Other chronic pancreatitis: Secondary | ICD-10-CM | POA: Diagnosis not present

## 2021-06-24 DIAGNOSIS — E039 Hypothyroidism, unspecified: Secondary | ICD-10-CM | POA: Diagnosis not present

## 2021-07-11 ENCOUNTER — Other Ambulatory Visit (HOSPITAL_BASED_OUTPATIENT_CLINIC_OR_DEPARTMENT_OTHER): Payer: Self-pay

## 2021-07-11 ENCOUNTER — Ambulatory Visit: Payer: BC Managed Care – PPO | Admitting: Internal Medicine

## 2021-07-11 MED ORDER — OMEGA-3-ACID ETHYL ESTERS 1 G PO CAPS
ORAL_CAPSULE | ORAL | 5 refills | Status: DC
  Filled 2021-07-11 – 2021-08-30 (×3): qty 120, 30d supply, fill #0

## 2021-07-11 MED ORDER — FENOFIBRATE 160 MG PO TABS
160.0000 mg | ORAL_TABLET | Freq: Every day | ORAL | 3 refills | Status: DC
  Filled 2021-07-11 – 2021-08-30 (×3): qty 90, 90d supply, fill #0

## 2021-07-11 MED ORDER — ESOMEPRAZOLE MAGNESIUM 40 MG PO CPDR
DELAYED_RELEASE_CAPSULE | ORAL | 3 refills | Status: DC
  Filled 2021-07-11 – 2021-08-30 (×2): qty 90, 90d supply, fill #0

## 2021-07-12 ENCOUNTER — Other Ambulatory Visit (HOSPITAL_BASED_OUTPATIENT_CLINIC_OR_DEPARTMENT_OTHER): Payer: Self-pay

## 2021-07-12 MED ORDER — AMPHETAMINE-DEXTROAMPHETAMINE 20 MG PO TABS
ORAL_TABLET | ORAL | 0 refills | Status: DC
  Filled 2021-08-10 – 2021-09-27 (×2): qty 90, 30d supply, fill #0

## 2021-07-12 MED ORDER — OXYCODONE-ACETAMINOPHEN 7.5-325 MG PO TABS
ORAL_TABLET | ORAL | 0 refills | Status: DC
  Filled 2021-08-26: qty 60, 30d supply, fill #0

## 2021-07-13 ENCOUNTER — Other Ambulatory Visit (HOSPITAL_BASED_OUTPATIENT_CLINIC_OR_DEPARTMENT_OTHER): Payer: Self-pay

## 2021-07-13 MED ORDER — ESOMEPRAZOLE MAGNESIUM 40 MG PO CPDR
DELAYED_RELEASE_CAPSULE | ORAL | 3 refills | Status: DC
  Filled 2021-07-13 – 2021-08-10 (×2): qty 90, 90d supply, fill #0

## 2021-07-13 MED ORDER — ALPRAZOLAM 0.5 MG PO TABS
ORAL_TABLET | ORAL | 1 refills | Status: DC
  Filled 2021-08-10 – 2021-08-30 (×2): qty 40, 20d supply, fill #0

## 2021-07-13 MED ORDER — FENOFIBRATE 160 MG PO TABS
ORAL_TABLET | ORAL | 3 refills | Status: DC
  Filled 2021-08-10: qty 90, 90d supply, fill #0

## 2021-07-14 ENCOUNTER — Other Ambulatory Visit (HOSPITAL_BASED_OUTPATIENT_CLINIC_OR_DEPARTMENT_OTHER): Payer: Self-pay

## 2021-07-27 ENCOUNTER — Other Ambulatory Visit (HOSPITAL_BASED_OUTPATIENT_CLINIC_OR_DEPARTMENT_OTHER): Payer: Self-pay

## 2021-08-10 ENCOUNTER — Other Ambulatory Visit (HOSPITAL_BASED_OUTPATIENT_CLINIC_OR_DEPARTMENT_OTHER): Payer: Self-pay

## 2021-08-11 ENCOUNTER — Other Ambulatory Visit (HOSPITAL_BASED_OUTPATIENT_CLINIC_OR_DEPARTMENT_OTHER): Payer: Self-pay

## 2021-08-12 DIAGNOSIS — H5213 Myopia, bilateral: Secondary | ICD-10-CM | POA: Diagnosis not present

## 2021-08-15 ENCOUNTER — Other Ambulatory Visit (HOSPITAL_BASED_OUTPATIENT_CLINIC_OR_DEPARTMENT_OTHER): Payer: Self-pay

## 2021-08-16 ENCOUNTER — Other Ambulatory Visit (HOSPITAL_BASED_OUTPATIENT_CLINIC_OR_DEPARTMENT_OTHER): Payer: Self-pay

## 2021-08-17 ENCOUNTER — Other Ambulatory Visit (HOSPITAL_BASED_OUTPATIENT_CLINIC_OR_DEPARTMENT_OTHER): Payer: Self-pay

## 2021-08-19 ENCOUNTER — Other Ambulatory Visit (HOSPITAL_BASED_OUTPATIENT_CLINIC_OR_DEPARTMENT_OTHER): Payer: Self-pay

## 2021-08-22 ENCOUNTER — Other Ambulatory Visit (HOSPITAL_BASED_OUTPATIENT_CLINIC_OR_DEPARTMENT_OTHER): Payer: Self-pay

## 2021-08-23 ENCOUNTER — Other Ambulatory Visit (HOSPITAL_BASED_OUTPATIENT_CLINIC_OR_DEPARTMENT_OTHER): Payer: Self-pay

## 2021-08-24 ENCOUNTER — Other Ambulatory Visit (HOSPITAL_BASED_OUTPATIENT_CLINIC_OR_DEPARTMENT_OTHER): Payer: Self-pay

## 2021-08-25 ENCOUNTER — Other Ambulatory Visit (HOSPITAL_BASED_OUTPATIENT_CLINIC_OR_DEPARTMENT_OTHER): Payer: Self-pay

## 2021-08-26 ENCOUNTER — Other Ambulatory Visit (HOSPITAL_BASED_OUTPATIENT_CLINIC_OR_DEPARTMENT_OTHER): Payer: Self-pay

## 2021-08-29 ENCOUNTER — Other Ambulatory Visit (HOSPITAL_BASED_OUTPATIENT_CLINIC_OR_DEPARTMENT_OTHER): Payer: Self-pay

## 2021-08-30 ENCOUNTER — Other Ambulatory Visit (HOSPITAL_BASED_OUTPATIENT_CLINIC_OR_DEPARTMENT_OTHER): Payer: Self-pay

## 2021-08-31 ENCOUNTER — Other Ambulatory Visit (HOSPITAL_BASED_OUTPATIENT_CLINIC_OR_DEPARTMENT_OTHER): Payer: Self-pay

## 2021-09-02 ENCOUNTER — Other Ambulatory Visit (HOSPITAL_BASED_OUTPATIENT_CLINIC_OR_DEPARTMENT_OTHER): Payer: Self-pay

## 2021-09-05 ENCOUNTER — Other Ambulatory Visit (HOSPITAL_BASED_OUTPATIENT_CLINIC_OR_DEPARTMENT_OTHER): Payer: Self-pay

## 2021-09-07 ENCOUNTER — Other Ambulatory Visit (HOSPITAL_BASED_OUTPATIENT_CLINIC_OR_DEPARTMENT_OTHER): Payer: Self-pay

## 2021-09-08 ENCOUNTER — Other Ambulatory Visit (HOSPITAL_BASED_OUTPATIENT_CLINIC_OR_DEPARTMENT_OTHER): Payer: Self-pay

## 2021-09-09 ENCOUNTER — Other Ambulatory Visit (HOSPITAL_BASED_OUTPATIENT_CLINIC_OR_DEPARTMENT_OTHER): Payer: Self-pay

## 2021-09-12 ENCOUNTER — Other Ambulatory Visit (HOSPITAL_BASED_OUTPATIENT_CLINIC_OR_DEPARTMENT_OTHER): Payer: Self-pay

## 2021-09-13 ENCOUNTER — Other Ambulatory Visit (HOSPITAL_BASED_OUTPATIENT_CLINIC_OR_DEPARTMENT_OTHER): Payer: Self-pay

## 2021-09-19 ENCOUNTER — Telehealth: Payer: 59 | Admitting: Physician Assistant

## 2021-09-19 DIAGNOSIS — J45990 Exercise induced bronchospasm: Secondary | ICD-10-CM | POA: Diagnosis not present

## 2021-09-19 MED ORDER — ALBUTEROL SULFATE HFA 108 (90 BASE) MCG/ACT IN AERS
2.0000 | INHALATION_SPRAY | Freq: Four times a day (QID) | RESPIRATORY_TRACT | 0 refills | Status: DC | PRN
  Filled 2021-09-19: qty 8.5, 17d supply, fill #0
  Filled 2021-10-03: qty 8.5, 25d supply, fill #0
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
## 2021-09-19 NOTE — Patient Instructions (Signed)
Connor Martin, thank you for joining Margaretann Loveless, PA-C for today's virtual visit.  While this provider is not your primary care provider (PCP), if your PCP is located in our provider database this encounter information will be shared with them immediately following your visit.  Consent: (Patient) Connor Martin provided verbal consent for this virtual visit at the beginning of the encounter.  Current Medications:  Current Outpatient Medications:    albuterol (VENTOLIN HFA) 108 (90 Base) MCG/ACT inhaler, Inhale 2 puffs into the lungs every 6 (six) hours as needed for wheezing or shortness of breath., Disp: 8 g, Rfl: 0   ALPRAZolam (XANAX) 0.5 MG tablet, Take 0.25 mg by mouth daily as needed for anxiety., Disp: , Rfl:    ALPRAZolam (XANAX) 0.5 MG tablet, Take 1 tablet by mouth twice daily as needed, Disp: 40 tablet, Rfl: 1   amphetamine-dextroamphetamine (ADDERALL) 20 MG tablet, Take 20 mg by mouth 3 (three) times daily., Disp: , Rfl: 0   amphetamine-dextroamphetamine (ADDERALL) 20 MG tablet, Take 1 tablet by mouth three times daily., Disp: 90 tablet, Rfl: 0   aspirin-acetaminophen-caffeine (EXCEDRIN MIGRAINE) 250-250-65 MG tablet, Take 1 tablet by mouth 2 (two) times daily as needed for migraine. , Disp: , Rfl:    cetirizine (ZYRTEC) 10 MG tablet, Take 10 mg by mouth daily., Disp: , Rfl:    esomeprazole (NEXIUM) 40 MG capsule, Take 40 mg by mouth daily., Disp: , Rfl:    esomeprazole (NEXIUM) 40 MG capsule, Take 1 capsule by mouth every day, Disp: 90 capsule, Rfl: 3   esomeprazole (NEXIUM) 40 MG capsule, Take 1 capsule by mouth every day, Disp: 90 capsule, Rfl: 3   fenofibrate 160 MG tablet, Take 1 tablet by mouth daily., Disp: , Rfl:    fenofibrate 160 MG tablet, TAKE 1 TABLET BY MOUTH EVERY DAY, Disp: 90 tablet, Rfl: 3   fenofibrate 160 MG tablet, Take 1 tablet by mouth every day, Disp: 90 tablet, Rfl: 3   gemfibrozil (LOPID) 600 MG tablet, Take 1 tablet (600 mg total) by mouth  2 (two) times daily before a meal., Disp: 60 tablet, Rfl: 1   magnesium oxide (MAG-OX) 400 MG tablet, Take 1 tablet by mouth daily., Disp: , Rfl:    Multiple Vitamin (MULTIVITAMIN WITH MINERALS) TABS tablet, Take 1 tablet by mouth daily., Disp: , Rfl:    omega-3 acid ethyl esters (LOVAZA) 1 g capsule, Take 1 capsule (1 g total) by mouth 2 (two) times daily., Disp: 120 capsule, Rfl: 5   omega-3 acid ethyl esters (LOVAZA) 1 g capsule, Take 2 capsules by mouth 2 times daily., Disp: 120 capsule, Rfl: 5   oxyCODONE-acetaminophen (PERCOCET) 7.5-325 MG tablet, Take 1 tablet by mouth every 12 (twelve) hours as needed., Disp: , Rfl:    oxyCODONE-acetaminophen (PERCOCET) 7.5-325 MG tablet, take 1 tablet by oral route  every 12 hours as needed, Disp: 60 tablet, Rfl: 0   Medications ordered in this encounter:  Meds ordered this encounter  Medications   albuterol (VENTOLIN HFA) 108 (90 Base) MCG/ACT inhaler    Sig: Inhale 2 puffs into the lungs every 6 (six) hours as needed for wheezing or shortness of breath.    Dispense:  8 g    Refill:  0    Order Specific Question:   Supervising Provider    Answer:   Hyacinth Meeker, BRIAN [[id number]]     *If you need refills on other medications prior to your next appointment, please contact your pharmacy*  Follow-Up:  Call back or seek an in-person evaluation if the symptoms worsen or if the condition fails to improve as anticipated.  Other Instructions Exercise-Induced Bronchoconstriction, Adult Exercise-induced bronchospasm (EIB) happens when the airways narrow during or after vigorous activity or exercise. The airways are the passages that lead from the nose and mouth down into the lungs. When the airways narrow, this can cause coughing, wheezing, and shortness of breath. This makes it hard to breathe. Anyone can develop EIB, even people who do not have allergies or asthma. With proper treatment, most people with EIB can be active and exercise normally. What are the  causes? The exact cause of this condition is not known. Symptoms are brought on (triggered) by physical activity. EIB can also be triggered by: Breathing very cold and dry or hot and humid air. Chemicals, such as chlorine in swimming pools, pesticides, or fertilizers. Outdoor triggers, such as: Air pollution. Car exhaust. Pollen from grass, trees, or flowers. Campfire smoke. Indoor triggers, such as: Dust. Mold. Tobacco smoke. Cleaning solutions. Animal dander. What increases the risk? You are more likely to develop this condition if you: Have asthma. Participate in sports that require constant motion, such as basketball, hockey, skiing, and swimming. Work outdoors. Exercise where there are higher levels of one or more EIB triggers. What are the signs or symptoms? Symptoms of this condition include: Coughing. Wheezing. Shortness of breath. Chest pain or tightness. Sore throat. Upset stomach. Symptoms may worsen after exercise has stopped. How is this diagnosed? EIB is diagnosed with your medical history and a physical exam. You may also have other tests, including: Lung function studies (spirometry). An exercise test to check for EIB symptoms. Allergy tests. How is this treated? Treatment for EIB includes preventing symptoms when possible, and treating EIB quickly when symptoms occur. Treatment may include: Taking medicine that your health care provider prescribes. Medicine comes in different forms, including: Medicines that you breathe in (inhale). These include: Steroids. These help to control your symptoms and are usually taken every day. Quick relief medicines. These help to quickly relieve your breathing difficulty. Medicines that you take by mouth (orally). These help to control allergies and asthma. Avoiding triggers. Stopping physical activity or exercise to rest. Follow these instructions at home: Take over-the-counter and prescription medicines only as told by  your health care provider. Do not use products that contain nicotine or tobacco, such as cigarettes, e-cigarettes, and chewing tobacco. If you need help quitting, ask your health care provider. Make changes in your workout as told by your health care provider. Exercise is important to your health and well-being. Keep quick relief medicine with you when you are exercising. Tell your exercise partners about your condition. Wear a medical ID bracelet. If you are planning to exercise alone or in an isolated area, let someone know where you are going and when you will be back. You may need to see a health care provider who specializes in allergies (allergist) or the lungs (pulmonologist) for more tests. Keep all follow-up visits as told by your health care provider. This is important. How is this prevented? Take medicines to prevent exercise-induced bronchospasm as told by your health care provider. Warm up before starting to play sports or exercise. Exercise indoors to avoid outdoor triggers. Cover your nose and mouth with a scarf to warm air that is very cold. Tell your workout partners or trainer about your condition. Tell them how to help you if you have an episode. Contact a health care provider if:  You have coughing, wheezing, or shortness of breath that continues after treatment. Your coughing wakes you up at night. You have less endurance than you used to. Get help right away if: Your medicine is not helping you breathe better. You cannot catch your breath. You pass out. Summary Exercise-induced bronchospasm (EIB) happens when the airways narrow during or after exercise. When the airways narrow, this can cause coughing, wheezing, and shortness of breath. It can be difficult to breathe. Take over-the-counter and prescription medicines only as told by your health care provider. Make changes in your workout as told by your health care provider. Contact a health care provider if you  continue to have trouble breathing after treatment. This information is not intended to replace advice given to you by your health care provider. Make sure you discuss any questions you have with your health care provider. Document Revised: 05/30/2018 Document Reviewed: 06/04/2018 Elsevier Patient Education  2022 [employer].    If you have been instructed to have an in-person evaluation today at a local Urgent Care facility, please use the link below. It will take you to a list of all of our available [facility], including address, phone number and hours of operation. Please do not delay care.  [facility]  If you or a family member do not have a primary care provider, use the link below to schedule a visit and establish care. When you choose a [facility] primary care physician or advanced practice provider, you gain a long-term partner in health. Find a Primary Care Provider  Learn more about [facility]'s in-office and virtual care options: [facility] - Get Care Now

## 2021-09-20 ENCOUNTER — Other Ambulatory Visit (HOSPITAL_BASED_OUTPATIENT_CLINIC_OR_DEPARTMENT_OTHER): Payer: Self-pay

## 2021-09-27 ENCOUNTER — Other Ambulatory Visit (HOSPITAL_BASED_OUTPATIENT_CLINIC_OR_DEPARTMENT_OTHER): Payer: Self-pay

## 2021-09-28 ENCOUNTER — Ambulatory Visit (INDEPENDENT_AMBULATORY_CARE_PROVIDER_SITE_OTHER): Payer: 59 | Admitting: Internal Medicine

## 2021-09-28 ENCOUNTER — Encounter: Payer: Self-pay | Admitting: Internal Medicine

## 2021-09-28 ENCOUNTER — Other Ambulatory Visit: Payer: Self-pay

## 2021-09-28 VITALS — BP 136/86 | HR 87 | Ht 74.0 in | Wt 217.0 lb

## 2021-09-28 DIAGNOSIS — R002 Palpitations: Secondary | ICD-10-CM | POA: Diagnosis not present

## 2021-09-28 DIAGNOSIS — R7989 Other specified abnormal findings of blood chemistry: Secondary | ICD-10-CM | POA: Diagnosis not present

## 2021-09-28 DIAGNOSIS — Z79899 Other long term (current) drug therapy: Secondary | ICD-10-CM

## 2021-09-28 NOTE — Progress Notes (Signed)
Cardiology Office Note   Date:  10/04/2021   ID:  Connor Martin, DOB 10-18-1975, MRN [id number]  PCP:  Kerry Fort, MD  Cardiologist:   Dietrich Pates, MD   Patient presents for evaluation of palpitaitons      History of Present Illness: Connor Martin is a 46 y.o. male with a history of ADHD, GERD, HL, anxiety and pancreatitis.  A CT in April 2022 showed atherosclerosis of the aorta.  USN showed fatty liver    The pt complains of palpitations    Pt says when running in past he would feel his heart rate pick up, go fast and then stop abruptly  Last seconds, not minutes.  No dizziness  No syncope  SOme spells with activity   would take breath away   Noticed about 6 months aog  Not as bad now.    Diet:   Breakfast  Skip Lunch   Salad, grilled chicken,  Dinner  Skips   1/2 week Water, rare sugar drink  OJ   +EtOH 1-2 drinks  Current Meds  Medication Sig   albuterol (VENTOLIN HFA) 108 (90 Base) MCG/ACT inhaler Inhale 2 puffs into the lungs every 6 (six) hours as needed for wheezing or shortness of breath.   ALPRAZolam (XANAX) 0.5 MG tablet Take 0.25 mg by mouth daily as needed for anxiety.   amphetamine-dextroamphetamine (ADDERALL) 20 MG tablet Take 20 mg by mouth 3 (three) times daily.   aspirin-acetaminophen-caffeine (EXCEDRIN MIGRAINE) 250-250-65 MG tablet Take 1 tablet by mouth 2 (two) times daily as needed for migraine.    cetirizine (ZYRTEC) 10 MG tablet Take 10 mg by mouth daily.   esomeprazole (NEXIUM) 40 MG capsule Take 40 mg by mouth daily.   fenofibrate 160 MG tablet Take 1 tablet by mouth daily.   gemfibrozil (LOPID) 600 MG tablet Take 1 tablet (600 mg total) by mouth 2 (two) times daily before a meal.   magnesium oxide (MAG-OX) 400 MG tablet Take 1 tablet by mouth daily.   Multiple Vitamin (MULTIVITAMIN WITH MINERALS) TABS tablet Take 1 tablet by mouth daily.   omega-3 acid ethyl esters (LOVAZA) 1 g capsule Take 1 capsule (1 g total) by mouth 2 (two) times daily.    oxyCODONE-acetaminophen (PERCOCET) 7.5-325 MG tablet Take 1 tablet by mouth every 12 (twelve) hours as needed.     Allergies:   Imitrex [sumatriptan], Latex, and Benadryl [diphenhydramine]   Past Medical History:  Diagnosis Date   Acute pancreatitis hospitalized 04/06/2016   Acute sinusitis    ADD (attention deficit disorder)    Anxiety    Blood in stool    Bronchitis    Chronic lower back pain    Depression    Dyslipidemia    Family history of adverse reaction to anesthesia    "mom PONV" (04/07/2016)   Family history of colon cancer    Fatty liver    GERD (gastroesophageal reflux disease)    Headache    "@ least once/week" (04/07/2016)   Hypertriglyceridemia    Hypertriglyceridemia    Hypothyroidism (acquired)    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    Migraine    "q other month" (04/07/2016)   Nasal congestion    Palpitations    Pancreatitis    Panic attacks    Pneumonia ~ 1981   Sleep apnea    Snoring     Past Surgical History:  Procedure Laterality Date   TONSILLECTOMY AND ADENOIDECTOMY  ~ 1990  Social History:  The patient  reports that he has never smoked. He has never used smokeless tobacco. He reports current alcohol use of about 10.0 standard drinks per week. He reports that he does not use drugs.   Family History:  The patient's family history includes ALS in his father; Diabetes in his mother; Heart disease in his mother; Hypothyroidism in his mother.    ROS:  Please see the history of present illness. All other systems are reviewed and  Negative to the above problem except as noted.    PHYSICAL EXAM: VS:  BP 136/86    Pulse 87    Ht 6\' 2"  (1.88 m)    Wt 217 lb (98.4 kg)    BMI 27.86 kg/m   GEN: Well nourished, well developed, in no acute distress  HEENT: normal  Neck: no JVD, carotid bruits Cardiac: RRR; no murmurs  No LE  edema  Respiratory:  clear to auscultation bilaterally,  GI: soft, nontender, nondistended, + BS  No hepatomegaly   MS: no deformity Moving all extremities   Skin: warm and dry, no rash Neuro:  Strength and sensation are intact Psych: euthymic mood, full affect   EKG:  EKG is ordered today.  NSR 87 bpm     Lipid Panel    Component Value Date/Time   CHOL 777 (H) 04/07/2016 0435   TRIG 325 (H) 04/10/2016 0500   HDL NOT REPORTED DUE TO HIGH TRIGLYCERIDES 04/07/2016 0435   CHOLHDL NOT REPORTED DUE TO HIGH TRIGLYCERIDES 04/07/2016 0435   VLDL UNABLE TO CALCULATE IF TRIGLYCERIDE OVER 400 mg/dL 04/09/2016 33/00/7622   LDLCALC UNABLE TO CALCULATE IF TRIGLYCERIDE OVER 400 mg/dL 6333 54/56/2563   LDLDIRECT 35 04/08/2016 0637      Wt Readings from Last 3 Encounters:  09/28/21 217 lb (98.4 kg)  04/10/16 220 lb 12.8 oz (100.2 kg)      ASSESSMENT AND PLAN:  1  Palpitations   Brief, self limited spells .  Do not sound destabilizing   Less frequent  Follow for now   If recur, worsen, can set up for monitor to capture/document    Follow for now   2  Atherosclerosis    Noted on CT scan   Recomm control risk factors    Pt without symptoms of anigna  3  Liipds    Will get lipomed today     4  Liver  USN with fatty liver   Discussed diet, low carb, low sweets/sugars/EtOH    will get liver panel including GGT     Tentative f/u in 1 year   Follow up on lab results    Current medicines are reviewed at length with the patient today.  The patient does not have concerns regarding medicines.  Signed, 04/12/16, MD  10/04/2021 9:36 PM    [facility] [address] [place], [place], [place]  [place] Phone: [phone]; Fax: [phone]

## 2021-09-28 NOTE — Patient Instructions (Addendum)
Medication Instructions:  Your physician recommends that you continue on your current medications as directed. Please refer to the Current Medication list given to you today.  *If you need a refill on your cardiac medications before your next appointment, please call your pharmacy*   Lab Work: Hepatic with GGT, Lipomed today  If you have labs (blood work) drawn today and your tests are completely normal, you will receive your results only by: MyChart Message (if you have MyChart) OR A paper copy in the mail If you have any lab test that is abnormal or we need to change your treatment, we will call you to review the results.   Testing/Procedures: none   Follow-Up: At [facility], you and your health needs are our priority.  As part of our continuing mission to provide you with exceptional heart care, we have created designated Provider Care Teams.  These Care Teams include your primary Cardiologist (physician) and Advanced Practice Providers (APPs -  Physician Assistants and Nurse Practitioners) who all work together to provide you with the care you need, when you need it.  We recommend signing up for the patient portal called "MyChart".  Sign up information is provided on this After Visit Summary.  MyChart is used to connect with patients for Virtual Visits (Telemedicine).  Patients are able to view lab/test results, encounter notes, upcoming appointments, etc.  Non-urgent messages can be sent to your provider as well.   To learn more about what you can do with MyChart, go to [email or web address].    Your next appointment:   1 year(s)  The format for your next appointment:   In Person  Provider:   Dr. Dietrich Pates    Other Instructions

## 2021-09-29 LAB — NMR, LIPOPROFILE

## 2021-09-30 ENCOUNTER — Other Ambulatory Visit (HOSPITAL_BASED_OUTPATIENT_CLINIC_OR_DEPARTMENT_OTHER): Payer: Self-pay

## 2021-09-30 LAB — APOLIPOPROTEIN B: Apolipoprotein B: 110 mg/dL — ABNORMAL HIGH (ref <90)

## 2021-09-30 LAB — HEPATIC FUNCTION PANEL
ALT: 43 IU/L (ref 0–44)
AST: 55 IU/L — ABNORMAL HIGH (ref 0–40)
Albumin: 5 g/dL (ref 4.0–5.0)
Alkaline Phosphatase: 182 IU/L — ABNORMAL HIGH (ref 44–121)
Bilirubin Total: 0.6 mg/dL (ref 0.0–1.2)
Bilirubin, Direct: 0.26 mg/dL (ref 0.00–0.40)
Total Protein: 7.6 g/dL (ref 6.0–8.5)

## 2021-09-30 LAB — NMR, LIPOPROFILE

## 2021-09-30 LAB — GAMMA GT: GGT: 1059 IU/L (ref 0–65)

## 2021-10-03 ENCOUNTER — Other Ambulatory Visit (HOSPITAL_BASED_OUTPATIENT_CLINIC_OR_DEPARTMENT_OTHER): Payer: Self-pay

## 2021-10-04 ENCOUNTER — Telehealth: Payer: Self-pay

## 2021-10-04 DIAGNOSIS — K760 Fatty (change of) liver, not elsewhere classified: Secondary | ICD-10-CM

## 2021-10-04 DIAGNOSIS — Z79899 Other long term (current) drug therapy: Secondary | ICD-10-CM

## 2021-10-04 DIAGNOSIS — K861 Other chronic pancreatitis: Secondary | ICD-10-CM | POA: Diagnosis not present

## 2021-10-04 DIAGNOSIS — E785 Hyperlipidemia, unspecified: Secondary | ICD-10-CM | POA: Diagnosis not present

## 2021-10-04 DIAGNOSIS — R739 Hyperglycemia, unspecified: Secondary | ICD-10-CM | POA: Diagnosis not present

## 2021-10-04 DIAGNOSIS — F900 Attention-deficit hyperactivity disorder, predominantly inattentive type: Secondary | ICD-10-CM | POA: Diagnosis not present

## 2021-10-04 DIAGNOSIS — F419 Anxiety disorder, unspecified: Secondary | ICD-10-CM | POA: Diagnosis not present

## 2021-10-04 DIAGNOSIS — E039 Hypothyroidism, unspecified: Secondary | ICD-10-CM | POA: Diagnosis not present

## 2021-10-04 DIAGNOSIS — K219 Gastro-esophageal reflux disease without esophagitis: Secondary | ICD-10-CM | POA: Diagnosis not present

## 2021-10-04 DIAGNOSIS — R7989 Other specified abnormal findings of blood chemistry: Secondary | ICD-10-CM

## 2021-10-04 DIAGNOSIS — M545 Low back pain, unspecified: Secondary | ICD-10-CM | POA: Diagnosis not present

## 2021-10-04 NOTE — Telephone Encounter (Signed)
Called Lab to investigate why labs were not drawn. She states that the LPA order was entered as a future order, so labs were not drawn. The lipomed was drawn that day, but according to [facility] was unable to be processed due to tube/assay incompatibility. Verified that the patient will NOT be charged for the Lipomed since it was not able to be ran. Per Katrina, we need to place another order for that test to be redrawn. Triage will place that order, as well as, correct the collection date on the LPA order so that both can be drawn at the same time. Called pt to see when a convenient time would be for him to come by, pt states he "just had pretty specific labs" drawn at [place] in the last week and is willing to come back here for labs, but wants to be sure that none are duplicated. Called [facility] and left message with medical records requesting records be sent to [place]. Pt will check his schedule and call us back to let us know what day next week he can come into the lab, as he is unavailable for the remainder of this week. Pt was able to confirm that he has seen Dr. Janell Quiet in the past for a colonoscopy.    Milus Banister, MD  Fay Records, MD; P Cv Div Ch St Triage I"ve actually never seen him before.  Looks like he's been cared for by Dr. Janell Quiet from [place] however

## 2021-10-04 NOTE — Telephone Encounter (Signed)
-----   Message from Dietrich Pates V, MD sent at 10/04/2021  2:41 PM EST ----- Please confirm why lipomed not done and Lpa Should be done again (no double charge) Liver function:   gamma GGT is a liver enzyme that is highly sensitive/specific.   Elevated    Other enzymes only mildly elevated I will forward to D Christella Hartigan re recommendations   He saw him in the past for pancreatitis I would recomm for now stopping alcohol     Tried to call pt but no answer

## 2021-10-05 ENCOUNTER — Other Ambulatory Visit (HOSPITAL_BASED_OUTPATIENT_CLINIC_OR_DEPARTMENT_OTHER): Payer: Self-pay

## 2021-10-07 ENCOUNTER — Other Ambulatory Visit (HOSPITAL_BASED_OUTPATIENT_CLINIC_OR_DEPARTMENT_OTHER): Payer: Self-pay

## 2021-10-10 ENCOUNTER — Other Ambulatory Visit (HOSPITAL_BASED_OUTPATIENT_CLINIC_OR_DEPARTMENT_OTHER): Payer: Self-pay

## 2021-10-10 MED ORDER — FENOFIBRATE 160 MG PO TABS
160.0000 mg | ORAL_TABLET | Freq: Every day | ORAL | 3 refills | Status: DC
  Filled 2021-10-10 – 2021-12-26 (×2): qty 90, 90d supply, fill #0
  Filled 2022-04-27: qty 90, 90d supply, fill #1

## 2021-10-10 MED ORDER — ESOMEPRAZOLE MAGNESIUM 40 MG PO CPDR
DELAYED_RELEASE_CAPSULE | ORAL | 3 refills | Status: DC
  Filled 2021-10-10 – 2021-12-26 (×2): qty 90, 90d supply, fill #0
  Filled 2022-04-27: qty 90, 90d supply, fill #1

## 2021-10-10 MED ORDER — OMEGA-3-ACID ETHYL ESTERS 1 G PO CAPS
ORAL_CAPSULE | ORAL | 5 refills | Status: DC
  Filled 2021-10-10: qty 360, 90d supply, fill #0
  Filled 2022-01-24: qty 360, 90d supply, fill #1

## 2021-10-10 MED ORDER — OXYCODONE-ACETAMINOPHEN 7.5-325 MG PO TABS
ORAL_TABLET | ORAL | 0 refills | Status: DC
  Filled 2021-10-10: qty 60, 30d supply, fill #0

## 2021-10-10 MED ORDER — ALPRAZOLAM 0.5 MG PO TABS
ORAL_TABLET | ORAL | 2 refills | Status: DC
  Filled 2021-10-10: qty 40, 20d supply, fill #0
  Filled 2021-11-15: qty 40, 20d supply, fill #1
  Filled 2021-12-26: qty 40, 20d supply, fill #2

## 2021-10-12 ENCOUNTER — Other Ambulatory Visit (HOSPITAL_BASED_OUTPATIENT_CLINIC_OR_DEPARTMENT_OTHER): Payer: Self-pay

## 2021-10-19 ENCOUNTER — Other Ambulatory Visit (HOSPITAL_BASED_OUTPATIENT_CLINIC_OR_DEPARTMENT_OTHER): Payer: Self-pay

## 2021-10-19 ENCOUNTER — Telehealth: Payer: Self-pay | Admitting: Internal Medicine

## 2021-10-19 NOTE — Telephone Encounter (Signed)
Patient wanted to know if Dr. Tenny Craw was going to order another GGT lab for him ans that score was elevated. He also wanted to make sure that he will not be charged for a second set of labs, as the first set had error during collection.   Please advise

## 2021-10-20 ENCOUNTER — Other Ambulatory Visit (HOSPITAL_BASED_OUTPATIENT_CLINIC_OR_DEPARTMENT_OTHER): Payer: Self-pay

## 2021-10-20 NOTE — Telephone Encounter (Addendum)
Left a message for the pt to call back.   Lab orders placed for NMR and LPA.Connor KitchenMarland KitchenHe will only have one charge for those labs.   His previous GGT was sent to Dr. Ardis Hughs so he he needs to follow up with him.   Need to make a lab appt. And say no charge in the notes.

## 2021-10-20 NOTE — Telephone Encounter (Addendum)
Pt to have the rest of his Lipomed drawn 10/21/21 and he is asking to have the GGT repeated while he is here having labs per his request.

## 2021-10-20 NOTE — Telephone Encounter (Addendum)
°  Connor Martin L 19 hours ago (4:56 PM)   Patient wanted to know if Dr. Tenny Craw was going to order another GGT lab for him ans that score was elevated. He also wanted to make sure that he will not be charged for a second set of labs, as the first set had error during collection.     Please advise

## 2021-10-20 NOTE — Addendum Note (Signed)
Addended by: Bertram Millard on: 10/20/2021 12:50 PM   Modules accepted: Orders

## 2021-10-20 NOTE — Addendum Note (Signed)
Addended by: Bertram Millard on: 10/20/2021 02:13 PM   Modules accepted: Orders

## 2021-10-20 NOTE — Telephone Encounter (Signed)
Copied this message to a previously opened encounter re: this message. Will close this encounter.

## 2021-10-21 ENCOUNTER — Other Ambulatory Visit: Payer: 59 | Admitting: *Deleted

## 2021-10-21 ENCOUNTER — Other Ambulatory Visit: Payer: Self-pay

## 2021-10-21 ENCOUNTER — Other Ambulatory Visit (HOSPITAL_BASED_OUTPATIENT_CLINIC_OR_DEPARTMENT_OTHER): Payer: Self-pay

## 2021-10-21 DIAGNOSIS — Z79899 Other long term (current) drug therapy: Secondary | ICD-10-CM | POA: Diagnosis not present

## 2021-10-21 DIAGNOSIS — E785 Hyperlipidemia, unspecified: Secondary | ICD-10-CM | POA: Diagnosis not present

## 2021-10-21 DIAGNOSIS — R7989 Other specified abnormal findings of blood chemistry: Secondary | ICD-10-CM

## 2021-10-21 DIAGNOSIS — K760 Fatty (change of) liver, not elsewhere classified: Secondary | ICD-10-CM

## 2021-10-23 LAB — NMR, LIPOPROFILE
Cholesterol, Total: 274 mg/dL — ABNORMAL HIGH (ref 100–199)
HDL Particle Number: 38.7 umol/L (ref >=30.5)
HDL-C: 53 mg/dL (ref >39)
LDL Particle Number: 1918 nmol/L — ABNORMAL HIGH (ref <1000)
LDL Size: 20.7 nm (ref >20.5)
LDL-C (NIH Calc): 190 mg/dL — ABNORMAL HIGH (ref 0–99)
LP-IR Score: 69 — ABNORMAL HIGH (ref <=45)
Small LDL Particle Number: 978 nmol/L — ABNORMAL HIGH (ref <=527)
Triglycerides: 169 mg/dL — ABNORMAL HIGH (ref 0–149)

## 2021-10-23 LAB — LIPOPROTEIN A (LPA): Lipoprotein (a): 16.3 nmol/L (ref <75.0)

## 2021-10-23 LAB — GAMMA GT: GGT: 834 IU/L (ref 0–65)

## 2021-10-24 ENCOUNTER — Telehealth: Payer: Self-pay

## 2021-10-24 ENCOUNTER — Other Ambulatory Visit (HOSPITAL_BASED_OUTPATIENT_CLINIC_OR_DEPARTMENT_OTHER): Payer: Self-pay

## 2021-10-24 DIAGNOSIS — R7989 Other specified abnormal findings of blood chemistry: Secondary | ICD-10-CM

## 2021-10-24 NOTE — Telephone Encounter (Signed)
-----   Message from Fay Records, MD sent at 10/24/2021 10:33 AM EST ----- Reviewed labs:    GGT still significantly elevated   Better than first draw Lipids:   Trig very mildly increased   LDL severely increased  190 He had 1 time of drinking since last labs  DOes not drink EtOH daily Herbals/vitamins:  Tumeric, mild thistle, Mg, biotin, Zn Takes meds Rare fast food    Overall says he eats a healthy diet Recomm:   Pt seen by Dr Alessandra Bevels in past (1 yr ago)   I would recomm he see him again to review, reevaluate Please put in for a virtual visit with me in June to review lipids, review gi

## 2021-10-24 NOTE — Telephone Encounter (Signed)
Left a message for the pt and sent message to his My Chart.

## 2021-10-25 ENCOUNTER — Other Ambulatory Visit (HOSPITAL_BASED_OUTPATIENT_CLINIC_OR_DEPARTMENT_OTHER): Payer: Self-pay

## 2021-10-25 ENCOUNTER — Encounter (HOSPITAL_BASED_OUTPATIENT_CLINIC_OR_DEPARTMENT_OTHER): Payer: Self-pay | Admitting: Pharmacist

## 2021-10-25 MED ORDER — AMPHETAMINE-DEXTROAMPHETAMINE 20 MG PO TABS
ORAL_TABLET | ORAL | 0 refills | Status: DC
  Filled 2021-10-25: qty 90, 30d supply, fill #0

## 2021-11-03 ENCOUNTER — Other Ambulatory Visit (HOSPITAL_BASED_OUTPATIENT_CLINIC_OR_DEPARTMENT_OTHER): Payer: Self-pay

## 2021-11-07 ENCOUNTER — Other Ambulatory Visit (HOSPITAL_BASED_OUTPATIENT_CLINIC_OR_DEPARTMENT_OTHER): Payer: Self-pay

## 2021-11-14 ENCOUNTER — Other Ambulatory Visit (HOSPITAL_BASED_OUTPATIENT_CLINIC_OR_DEPARTMENT_OTHER): Payer: Self-pay

## 2021-11-15 ENCOUNTER — Other Ambulatory Visit (HOSPITAL_BASED_OUTPATIENT_CLINIC_OR_DEPARTMENT_OTHER): Payer: Self-pay

## 2021-11-16 ENCOUNTER — Other Ambulatory Visit (HOSPITAL_BASED_OUTPATIENT_CLINIC_OR_DEPARTMENT_OTHER): Payer: Self-pay

## 2021-11-18 ENCOUNTER — Other Ambulatory Visit (HOSPITAL_BASED_OUTPATIENT_CLINIC_OR_DEPARTMENT_OTHER): Payer: Self-pay

## 2021-11-21 ENCOUNTER — Other Ambulatory Visit (HOSPITAL_BASED_OUTPATIENT_CLINIC_OR_DEPARTMENT_OTHER): Payer: Self-pay

## 2021-11-21 MED ORDER — OXYCODONE-ACETAMINOPHEN 7.5-325 MG PO TABS
ORAL_TABLET | ORAL | 0 refills | Status: DC
  Filled 2021-11-21: qty 60, 30d supply, fill #0

## 2021-12-19 DIAGNOSIS — R7989 Other specified abnormal findings of blood chemistry: Secondary | ICD-10-CM | POA: Diagnosis not present

## 2021-12-19 DIAGNOSIS — Z789 Other specified health status: Secondary | ICD-10-CM | POA: Diagnosis not present

## 2021-12-19 DIAGNOSIS — Z8719 Personal history of other diseases of the digestive system: Secondary | ICD-10-CM | POA: Diagnosis not present

## 2021-12-19 DIAGNOSIS — R109 Unspecified abdominal pain: Secondary | ICD-10-CM | POA: Diagnosis not present

## 2021-12-19 DIAGNOSIS — R945 Abnormal results of liver function studies: Secondary | ICD-10-CM | POA: Diagnosis not present

## 2021-12-19 DIAGNOSIS — E781 Pure hyperglyceridemia: Secondary | ICD-10-CM | POA: Diagnosis not present

## 2021-12-26 ENCOUNTER — Other Ambulatory Visit (HOSPITAL_BASED_OUTPATIENT_CLINIC_OR_DEPARTMENT_OTHER): Payer: Self-pay

## 2021-12-27 ENCOUNTER — Other Ambulatory Visit (HOSPITAL_BASED_OUTPATIENT_CLINIC_OR_DEPARTMENT_OTHER): Payer: Self-pay

## 2021-12-29 ENCOUNTER — Other Ambulatory Visit (HOSPITAL_BASED_OUTPATIENT_CLINIC_OR_DEPARTMENT_OTHER): Payer: Self-pay

## 2021-12-29 ENCOUNTER — Encounter (HOSPITAL_BASED_OUTPATIENT_CLINIC_OR_DEPARTMENT_OTHER): Payer: Self-pay | Admitting: Pharmacist

## 2021-12-29 MED ORDER — AMPHETAMINE-DEXTROAMPHETAMINE 20 MG PO TABS
ORAL_TABLET | ORAL | 0 refills | Status: DC
  Filled 2021-12-29: qty 90, 30d supply, fill #0

## 2021-12-29 MED ORDER — OXYCODONE-ACETAMINOPHEN 7.5-325 MG PO TABS
ORAL_TABLET | ORAL | 0 refills | Status: DC
  Filled 2021-12-29: qty 60, 30d supply, fill #0

## 2022-01-24 ENCOUNTER — Other Ambulatory Visit (HOSPITAL_BASED_OUTPATIENT_CLINIC_OR_DEPARTMENT_OTHER): Payer: Self-pay

## 2022-01-25 ENCOUNTER — Other Ambulatory Visit (HOSPITAL_BASED_OUTPATIENT_CLINIC_OR_DEPARTMENT_OTHER): Payer: Self-pay

## 2022-02-06 ENCOUNTER — Other Ambulatory Visit (HOSPITAL_BASED_OUTPATIENT_CLINIC_OR_DEPARTMENT_OTHER): Payer: Self-pay

## 2022-02-15 ENCOUNTER — Other Ambulatory Visit (HOSPITAL_BASED_OUTPATIENT_CLINIC_OR_DEPARTMENT_OTHER): Payer: Self-pay

## 2022-02-21 ENCOUNTER — Other Ambulatory Visit (HOSPITAL_BASED_OUTPATIENT_CLINIC_OR_DEPARTMENT_OTHER): Payer: Self-pay

## 2022-02-21 MED ORDER — ALPRAZOLAM 0.5 MG PO TABS
ORAL_TABLET | ORAL | 0 refills | Status: DC
  Filled 2022-02-21: qty 40, 20d supply, fill #0

## 2022-02-21 MED ORDER — AMPHETAMINE-DEXTROAMPHETAMINE 20 MG PO TABS
ORAL_TABLET | ORAL | 0 refills | Status: DC
  Filled 2022-02-21: qty 90, 30d supply, fill #0

## 2022-02-24 ENCOUNTER — Other Ambulatory Visit (HOSPITAL_BASED_OUTPATIENT_CLINIC_OR_DEPARTMENT_OTHER): Payer: Self-pay

## 2022-02-27 ENCOUNTER — Other Ambulatory Visit (HOSPITAL_COMMUNITY): Payer: Self-pay

## 2022-02-27 ENCOUNTER — Other Ambulatory Visit (HOSPITAL_BASED_OUTPATIENT_CLINIC_OR_DEPARTMENT_OTHER): Payer: Self-pay

## 2022-02-28 ENCOUNTER — Other Ambulatory Visit (HOSPITAL_BASED_OUTPATIENT_CLINIC_OR_DEPARTMENT_OTHER): Payer: Self-pay

## 2022-02-28 DIAGNOSIS — F4321 Adjustment disorder with depressed mood: Secondary | ICD-10-CM | POA: Diagnosis not present

## 2022-02-28 DIAGNOSIS — R109 Unspecified abdominal pain: Secondary | ICD-10-CM | POA: Diagnosis not present

## 2022-02-28 DIAGNOSIS — F419 Anxiety disorder, unspecified: Secondary | ICD-10-CM | POA: Diagnosis not present

## 2022-02-28 DIAGNOSIS — K861 Other chronic pancreatitis: Secondary | ICD-10-CM | POA: Diagnosis not present

## 2022-02-28 DIAGNOSIS — E039 Hypothyroidism, unspecified: Secondary | ICD-10-CM | POA: Diagnosis not present

## 2022-02-28 DIAGNOSIS — K219 Gastro-esophageal reflux disease without esophagitis: Secondary | ICD-10-CM | POA: Diagnosis not present

## 2022-02-28 DIAGNOSIS — F900 Attention-deficit hyperactivity disorder, predominantly inattentive type: Secondary | ICD-10-CM | POA: Diagnosis not present

## 2022-02-28 DIAGNOSIS — E785 Hyperlipidemia, unspecified: Secondary | ICD-10-CM | POA: Diagnosis not present

## 2022-02-28 DIAGNOSIS — K760 Fatty (change of) liver, not elsewhere classified: Secondary | ICD-10-CM | POA: Diagnosis not present

## 2022-02-28 MED ORDER — AMPHETAMINE-DEXTROAMPHETAMINE 20 MG PO TABS: ORAL_TABLET | ORAL | 0 refills | Status: DC

## 2022-02-28 MED ORDER — ALPRAZOLAM 0.5 MG PO TABS
ORAL_TABLET | ORAL | 2 refills | Status: DC
Start: 2022-03-20 — End: 2022-10-06
  Filled 2022-04-27: qty 40, 20d supply, fill #0

## 2022-02-28 MED ORDER — AMPHETAMINE-DEXTROAMPHETAMINE 20 MG PO TABS
ORAL_TABLET | ORAL | 0 refills | Status: DC
  Filled 2022-04-27: qty 90, 30d supply, fill #0

## 2022-02-28 MED ORDER — OMEGA-3-ACID ETHYL ESTERS 1 G PO CAPS
ORAL_CAPSULE | ORAL | 1 refills | Status: AC
  Filled 2022-02-28: qty 360, 90d supply, fill #0
  Filled 2022-12-08: qty 360, 90d supply, fill #1

## 2022-02-28 MED ORDER — OXYCODONE-ACETAMINOPHEN 7.5-325 MG PO TABS
ORAL_TABLET | ORAL | 0 refills | Status: DC
  Filled 2022-02-28: qty 60, 30d supply, fill #0

## 2022-02-28 MED ORDER — OXYCODONE-ACETAMINOPHEN 7.5-325 MG PO TABS
ORAL_TABLET | ORAL | 0 refills | Status: DC
  Filled 2022-04-27: qty 60, 30d supply, fill #0

## 2022-02-28 MED ORDER — OXYCODONE-ACETAMINOPHEN 7.5-325 MG PO TABS: ORAL_TABLET | ORAL | 0 refills | Status: DC

## 2022-04-07 ENCOUNTER — Emergency Department (HOSPITAL_COMMUNITY)
Admission: EM | Admit: 2022-04-07 | Discharge: 2022-04-07 | Disposition: A | Discharge status: Home / Self Care | Payer: 59 | Attending: Emergency Medicine | Admitting: Emergency Medicine

## 2022-04-07 ENCOUNTER — Other Ambulatory Visit: Payer: Self-pay

## 2022-04-07 ENCOUNTER — Encounter (HOSPITAL_COMMUNITY): Payer: Self-pay

## 2022-04-07 ENCOUNTER — Emergency Department (HOSPITAL_COMMUNITY): Payer: 59

## 2022-04-07 DIAGNOSIS — R748 Abnormal levels of other serum enzymes: Secondary | ICD-10-CM | POA: Diagnosis not present

## 2022-04-07 DIAGNOSIS — R519 Headache, unspecified: Secondary | ICD-10-CM | POA: Diagnosis not present

## 2022-04-07 DIAGNOSIS — E876 Hypokalemia: Secondary | ICD-10-CM | POA: Diagnosis not present

## 2022-04-07 DIAGNOSIS — Z91040 Latex allergy status: Secondary | ICD-10-CM | POA: Diagnosis not present

## 2022-04-07 DIAGNOSIS — S0990XA Unspecified injury of head, initial encounter: Secondary | ICD-10-CM | POA: Diagnosis not present

## 2022-04-07 DIAGNOSIS — E039 Hypothyroidism, unspecified: Secondary | ICD-10-CM | POA: Diagnosis not present

## 2022-04-07 DIAGNOSIS — Z7982 Long term (current) use of aspirin: Secondary | ICD-10-CM | POA: Diagnosis not present

## 2022-04-07 DIAGNOSIS — R110 Nausea: Secondary | ICD-10-CM | POA: Diagnosis not present

## 2022-04-07 DIAGNOSIS — R7401 Elevation of levels of liver transaminase levels: Secondary | ICD-10-CM | POA: Insufficient documentation

## 2022-04-07 DIAGNOSIS — R42 Dizziness and giddiness: Secondary | ICD-10-CM | POA: Diagnosis not present

## 2022-04-07 DIAGNOSIS — R1110 Vomiting, unspecified: Secondary | ICD-10-CM | POA: Diagnosis not present

## 2022-04-07 LAB — COMPREHENSIVE METABOLIC PANEL
ALT: 33 U/L (ref 0–44)
AST: 48 U/L — ABNORMAL HIGH (ref 15–41)
Albumin: 4.2 g/dL (ref 3.5–5.0)
Alkaline Phosphatase: 137 U/L — ABNORMAL HIGH (ref 38–126)
Anion gap: 15 (ref 5–15)
BUN: 13 mg/dL (ref 6–20)
CO2: 22 mmol/L (ref 22–32)
Calcium: 9.2 mg/dL (ref 8.9–10.3)
Chloride: 100 mmol/L (ref 98–111)
Creatinine, Ser: 0.92 mg/dL (ref 0.61–1.24)
GFR, Estimated: >60 mL/min (ref >60)
Glucose, Bld: 107 mg/dL — ABNORMAL HIGH (ref 70–99)
Potassium: 3.4 mmol/L — ABNORMAL LOW (ref 3.5–5.1)
Sodium: 137 mmol/L (ref 135–145)
Total Bilirubin: 0.6 mg/dL (ref 0.3–1.2)
Total Protein: 7 g/dL (ref 6.5–8.1)

## 2022-04-07 LAB — CBC WITH DIFFERENTIAL/PLATELET
Abs Immature Granulocytes: 0.03 10*3/uL (ref 0.00–0.07)
Basophils Absolute: 0 10*3/uL (ref 0.0–0.1)
Basophils Relative: 0 %
Eosinophils Absolute: 0.1 10*3/uL (ref 0.0–0.5)
Eosinophils Relative: 1 %
HCT: 40.1 % (ref 39.0–52.0)
Hemoglobin: 14.3 g/dL (ref 13.0–17.0)
Immature Granulocytes: 0 %
Lymphocytes Relative: 14 %
Lymphs Abs: 1.3 10*3/uL (ref 0.7–4.0)
MCH: 33.7 pg (ref 26.0–34.0)
MCHC: 35.7 g/dL (ref 30.0–36.0)
MCV: 94.6 fL (ref 80.0–100.0)
Monocytes Absolute: 0.5 10*3/uL (ref 0.1–1.0)
Monocytes Relative: 6 %
Neutro Abs: 7.3 10*3/uL (ref 1.7–7.7)
Neutrophils Relative %: 79 %
Platelets: 235 10*3/uL (ref 150–400)
RBC: 4.24 MIL/uL (ref 4.22–5.81)
RDW: 12.6 % (ref 11.5–15.5)
WBC: 9.2 10*3/uL (ref 4.0–10.5)
nRBC: 0 % (ref 0.0–0.2)

## 2022-04-07 LAB — LIPASE, BLOOD: Lipase: 34 U/L (ref 11–51)

## 2022-04-07 LAB — TROPONIN I (HIGH SENSITIVITY): Troponin I (High Sensitivity): 6 ng/L (ref <18)

## 2022-04-07 NOTE — ED Provider Notes (Signed)
[facility] EMERGENCY DEPARTMENT Provider Note   CSN: [id number] Arrival date & time: 04/07/22  1314     History PMH: HLD, pancreatitis, migraines, anxiety, hypothyroidism GERD Chief Complaint  Patient presents with   Dizziness    Connor Martin is a 46 y.o. male. Patient presents the emergency department with a headache.  He states that his headache started at some point last night and woke him up from his sleep.  He eats that he has associated nausea with it.  He did really think much of it throughout the day because has frequent headaches, however today while he was at lunch he started noticing some tingling on his left cheek.  He also had associated dizziness at that time.  This sensation lasted for about 30 minutes.  He was concerned he was having a panic attack so he took Xanax which did help some.  He says that his symptoms have completely resolved since this happened. Per patient's wife, headaches have been getting worse throughout the past year.   Dizziness Associated symptoms: headaches and nausea   Associated symptoms: no chest pain, no shortness of breath and no weakness        Home Medications Prior to Admission medications   Medication Sig Start Date End Date Taking? Authorizing Provider  albuterol (VENTOLIN HFA) 108 (90 Base) MCG/ACT inhaler Inhale 2 puffs into the lungs every 6 (six) hours as needed for wheezing or shortness of breath. 09/19/21   Margaretann Loveless, PA-C  ALPRAZolam Prudy Feeler) 0.5 MG tablet Take 0.25 mg by mouth daily as needed for anxiety.    [provider]  ALPRAZolam Prudy Feeler) 0.5 MG tablet take 1 tablet by oral route 2 times every day as needed 02/18/22     ALPRAZolam (XANAX) 0.5 MG tablet take 1 tablet by oral route 2 times every day as needed 03/20/22     amphetamine-dextroamphetamine (ADDERALL) 20 MG tablet Take 20 mg by mouth 3 (three) times daily. 02/04/16   [provider]  amphetamine-dextroamphetamine  (ADDERALL) 20 MG tablet Take 1 tablet by mouth three times daily. 12/29/21     amphetamine-dextroamphetamine (ADDERALL) 20 MG tablet 1 Tablet tid 04/19/22     amphetamine-dextroamphetamine (ADDERALL) 20 MG tablet 1 Tablet tid 02/28/22     aspirin-acetaminophen-caffeine (EXCEDRIN MIGRAINE) 250-250-65 MG tablet Take 1 tablet by mouth 2 (two) times daily as needed for migraine.     [provider]  cetirizine (ZYRTEC) 10 MG tablet Take 10 mg by mouth daily.    [provider]  esomeprazole (NEXIUM) 40 MG capsule Take 40 mg by mouth daily. 04/04/16   [provider]  esomeprazole (NEXIUM) 40 MG capsule take 1 capsule by oral route  every day 10/10/21     fenofibrate 160 MG tablet Take 1 tablet by mouth daily. 10/25/18   [provider]  fenofibrate 160 MG tablet TAKE 1 TABLET BY MOUTH EVERY DAY 10/10/21     gemfibrozil (LOPID) 600 MG tablet Take 1 tablet (600 mg total) by mouth 2 (two) times daily before a meal. 04/10/16   Vassie Loll, MD  magnesium oxide (MAG-OX) 400 MG tablet Take 1 tablet by mouth daily.    [provider]  Multiple Vitamin (MULTIVITAMIN WITH MINERALS) TABS tablet Take 1 tablet by mouth daily. 04/10/16   Vassie Loll, MD  omega-3 acid ethyl esters (LOVAZA) 1 g capsule Take 1 capsule (1 g total) by mouth 2 (two) times daily. 06/24/21     omega-3 acid ethyl  esters (LOVAZA) 1 g capsule take 2 capsule by oral route 2 times every day 10/10/21     omega-3 acid ethyl esters (LOVAZA) 1 g capsule take 2 capsule by oral route 2 times every day 02/28/22     oxyCODONE-acetaminophen (PERCOCET) 7.5-325 MG tablet Take 1 tablet by mouth every 12 (twelve) hours as needed. 12/08/19   [provider]  oxyCODONE-acetaminophen (PERCOCET) 7.5-325 MG tablet take 1 tablet by oral route  every 12 hours as needed 02/28/22     oxyCODONE-acetaminophen (PERCOCET) 7.5-325 MG tablet take 1 tablet by oral route  every 12 hours as needed 04/29/22     oxyCODONE-acetaminophen  (PERCOCET) 7.5-325 MG tablet take 1 tablet by oral route  every 12 hours as needed 03/30/22         Allergies    Imitrex [sumatriptan], Latex, and Benadryl [diphenhydramine]    Review of Systems   Review of Systems  Constitutional:  Negative for chills and fever.  Eyes:  Negative for photophobia and visual disturbance.  Respiratory:  Negative for shortness of breath.   Cardiovascular:  Negative for chest pain.  Gastrointestinal:  Positive for nausea. Negative for abdominal pain.  Neurological:  Positive for dizziness and headaches. Negative for tremors, weakness and numbness.  All other systems reviewed and are negative.   Physical Exam Updated Vital Signs BP (!) 166/100   Pulse 93   Temp 98.5 F (36.9 C) (Oral)   Resp 18   Ht 6\' 2"  (1.88 m)   Wt 97.5 kg   SpO2 99%   BMI 27.60 kg/m  Physical Exam Vitals and nursing note reviewed.  Constitutional:      General: He is not in acute distress.    Appearance: Normal appearance. He is well-developed. He is not ill-appearing, toxic-appearing or diaphoretic.  HENT:     Head: Normocephalic and atraumatic.     Nose: No nasal deformity.     Mouth/Throat:     Lips: Pink. No lesions.  Eyes:     General: Gaze aligned appropriately. No scleral icterus.       Right eye: No discharge.        Left eye: No discharge.     Conjunctiva/sclera: Conjunctivae normal.     Right eye: Right conjunctiva is not injected. No exudate or hemorrhage.    Left eye: Left conjunctiva is not injected. No exudate or hemorrhage. Pulmonary:     Effort: Pulmonary effort is normal. No respiratory distress.  Skin:    General: Skin is warm and dry.  Neurological:     General: No focal deficit present.     Mental Status: He is alert and oriented to person, place, and time. Mental status is at baseline.     Comments: Alert and Oriented x 3 Speech clear with no aphasia PERRLA. EOM intact. No Nystagmus 5/5 motor strength in all four extremities.  Grossly intact  in all four extremities.  Gait without abnormality.   Psychiatric:        Mood and Affect: Mood normal.        Speech: Speech normal.        Behavior: Behavior normal. Behavior is cooperative.     ED Results / Procedures / Treatments   Labs (all labs ordered are listed, but only abnormal results are displayed) Labs Reviewed  COMPREHENSIVE METABOLIC PANEL - Abnormal; Notable for the following components:      Result Value   Potassium 3.4 (*)    Glucose, Bld 107 (*)  AST 48 (*)    Alkaline Phosphatase 137 (*)    All other components within normal limits  LIPASE, BLOOD  CBC WITH DIFFERENTIAL/PLATELET  TROPONIN I (HIGH SENSITIVITY)  TROPONIN I (HIGH SENSITIVITY)    EKG EKG Interpretation  Date/Time:  Friday April 07 2022 13:22:53 EDT Ventricular Rate:  103 PR Interval:  148 QRS Duration: 82 QT Interval:  356 QTC Calculation: 466 R Axis:   29 Text Interpretation: Sinus tachycardia Otherwise normal ECG When compared with ECG of 06-Apr-2016 19:22, No significant change since last tracing Confirmed by Meridee Score [id number]) on 04/07/2022 4:07:16 PM  Radiology CT HEAD WO CONTRAST ( )  Result Date: 04/07/2022 CLINICAL DATA:  Blunt head trauma.  Vomiting. EXAM: CT HEAD WITHOUT CONTRAST TECHNIQUE: Contiguous axial images were obtained from the base of the skull through the vertex without intravenous contrast. RADIATION DOSE REDUCTION: This exam was performed according to the departmental dose-optimization program which includes automated exposure control, adjustment of the mA and/or kV according to patient size and/or use of iterative reconstruction technique. COMPARISON:  None Available. FINDINGS: Brain: No evidence of intracranial hemorrhage, acute infarction, hydrocephalus, extra-axial collection, or mass lesion/mass effect. Vascular:  No hyperdense vessel or other acute findings. Skull: No evidence of fracture or other significant bone abnormality. Sinuses/Orbits:  No acute  findings. Other: None. IMPRESSION: Negative noncontrast head CT. Electronically Signed   By: Danae Orleans M.D.   On: 04/07/2022 14:21    Procedures Procedures   Medications Ordered in ED Medications - No data to display  ED Course/ Medical Decision Making/ A&P                           Medical Decision Making   MDM  This is a 46 y.o. male who presents to the ED with headache and dizziness  Initial Impression  Well-appearing patient in no acute distress.  He has stable vital signs.  I personally ordered, reviewed, and interpreted all laboratory work and imaging and agree with radiologist interpretation. Results interpreted below: CBC is unremarkable.  CMP shows mild hypokalemia to 3.4, AST is mildly elevated which is per baseline, alkaline phosphatase is also elevated which is baseline.  Kidney function intact.  Lipase normal.  Troponin is negative. CT head without acute abnormality EKG reveals ST, non ischemic  Assessment/Plan:  Overall unremarkable work-up.  On reassessment, patient's symptoms are completely resolved.  I do not think he needs an emergent MRI at this point as he has no further neurological symptoms and they are likely associated with complex migraine versus anxiety.  I discussed the results with the family very and detailed.  They are concerned about his elevated blood pressure.  I told him they can follow-up with her primary care regarding this as well as other chronic health concerns. I do think patient would benefit from a neurology outpatient visit since he is having more frequent headaches that seem to be getting worse.  They put this ambulatory referral in for him.  Recommend follow-up with PCP.  No further workup indicated   Charting Requirements Additional history is obtained from:  Independent historian External Records from outside source obtained and reviewed including: n/a Social Determinants of Health:  none Pertinant PMH that complicates patient's  illness: migraines  Patient Care Problems that were addressed during this visit: - Headache: Acute illness with systemic symptoms This patient was maintained on a cardiac monitor/telemetry. I personally viewed and interpreted the cardiac monitor which reveals an underlying  rhythm of NSR/ST Medications given in ED: n/a Reevaluation of the patient after these medicines showed that the patient resolved I have reviewed home medications and made changes accordingly.  Critical Care Interventions: n/a Consultations: n/a Disposition: discharge  Portions of this note were generated with Dragon dictation software. Dictation errors may occur despite best attempts at proofreading.    Final Clinical Impression(s) / ED Diagnoses Final diagnoses:  Acute intractable headache, unspecified headache type    Rx / DC Orders ED Discharge Orders          Ordered    Ambulatory referral to Neurology       Comments: An appointment is requested in approximately: 1 week   04/07/22 1633              Therese Sarah 04/07/22 1643    Wynetta Fines, MD 04/07/22 2258

## 2022-04-07 NOTE — ED Notes (Signed)
Discharge instructions reviewed with patient. Patient verbalized understanding of instructions. Follow-up care and medications were reviewed. Patient ambulatory with steady gait. VSS upon discharge.  ?

## 2022-04-07 NOTE — ED Provider Triage Note (Signed)
Emergency Medicine Provider Triage Evaluation Note  Connor Martin , a 46 y.o. male  was evaluated in triage.  Pt complains of feeling weak and dizzy.  He reports headache since last night.  He has been feeling nauseated.  He feels dizzy when he stands up.   No abdominal or chest pain.    His wife reports that once a week he is having episodes of epigastric pain with headache  He drinks about 5 days a week and drinks 5-6 days a week.  His last drink was yesterday.  He denies shakes and tremors if he doesn't drink.   He took a xanax about 30 minutes PTA.    He denies fevers.  No current abdominal pain.   No chiropractic adjustments.  He did hit his head 2 weeks ago falling down the steps in the dark.   Does have a history of migraines.   He had tingling in the left side of his face and the right foot which was abnormal for him with a headache.   Physical Exam  BP (!) 153/94 (BP Location: Left Arm)   Pulse (!) 110   Temp 98.5 F (36.9 C) (Oral)   Resp 18   Ht 6\' 2"  (1.88 m)   Wt 97.5 kg   SpO2 98%   BMI 27.60 kg/m  Gen:   Awake, no distress   Resp:  Normal effort  MSK:   Moves extremities without difficulty  Other:  Normal speech.   Medical Decision Making  Medically screening exam initiated at 1:38 PM.  Appropriate orders placed.  Connor Martin was informed that the remainder of the evaluation will be completed by another provider, this initial triage assessment does not replace that evaluation, and the importance of remaining in the ED until their evaluation is complete.     Nelly Laurence, Cristina Gong 04/07/22 1350

## 2022-04-07 NOTE — ED Triage Notes (Signed)
Pt started having headache last night, since then pt started feeling weak and dizzy. Pt states he thought he may be having a panic attack and took a xanax w/some relief.  Pt has had nausea. Pt states he feels tingling in face.

## 2022-04-07 NOTE — Discharge Instructions (Signed)
Head CT today was normal.  Your labs are also very reassuring.  You have normal cardiac markers, normal pancreatic markers, your LFTs are stable from your baseline, your blood sugar is not concerning today. With worsening, frequent headaches, I recommend following up with neurology.  I placed an ambulatory referral for outpatient neurology.  If you do not hear from them in this couple of days, I recommend calling them to schedule a follow-up appointment. I also recommend following up with your primary care doctor to reassess your blood pressure, and check other health needs.

## 2022-04-13 NOTE — Progress Notes (Signed)
Referring:  Claudie Leach, PA-C [address] [place],  [place] [address]  PCP: Kerry Fort, MD  Neurology was asked to evaluate Gerrald Basu, a 46 year old male for a chief complaint of headaches.  Our recommendations of care will be communicated by shared medical record.    CC:  headaches  History provided from self  HPI:  Medical co-morbidities: GERD, GAD, hypertriglyceridemia c/b pancreatitis  The patient presents for evaluation of headaches and numbness. He started having migraines in college. Initially would have them once ever 1-2 months, but they have increased in frequency over the past 2 months. Currently averages one migraine per week.  On 04/07/22 he developed a severe headache. Took Excedrin migraine which helped a little but did not resolve his headache. Felt very nauseated and was not thinking clearly. Had to have his wife give him directions while driving. Got out of the drivers seat and lied down in the backseat. Got out of the car and felt off-balance. Then the right side of his face started tingling and went numb. Right foot also went numb. This lasted for ~30 minutes. Wife told him his speech was incoherent. Also felt lightheaded and his heart was pounding. Presented to the ED where [facility] was unremarkable. BP was elevated (166/100). Headache lasted for 24 hours until it resolved.  Notes he recently fell down the stairs 2 weeks prior to the headache, thinks he hit his head at the time. Did not seek treatment.  Recently had an episode of very blurred vision in his left eye which lasted for 2 days. This was not associated with a headache.  Headache History: Onset: college, increased in frequency over the past 2 months Triggers: fasting Aura: numbness in face/foot Associated Symptoms:  Photophobia: yes  Phonophobia: yes  Nausea: yes Vomiting: no Worse with activity?: yes Duration of headaches: can last all day  Headache days per month: 4 Headache free  days per month: 26  Current Treatment: Abortive Excedrin  Preventative none  Prior Therapies                                 Excedrin Imitrex - swelling  LABS: CBC    Component Value Date/Time   WBC 9.2 04/07/2022 1353   RBC 4.24 04/07/2022 1353   HGB 14.3 04/07/2022 1353   HCT 40.1 04/07/2022 1353   PLT 235 04/07/2022 1353   MCV 94.6 04/07/2022 1353   MCH 33.7 04/07/2022 1353   MCHC 35.7 04/07/2022 1353   RDW 12.6 04/07/2022 1353   LYMPHSABS 1.3 04/07/2022 1353   MONOABS 0.5 04/07/2022 1353   EOSABS 0.1 04/07/2022 1353   BASOSABS 0.0 04/07/2022 1353      Latest Ref Rng & Units 04/07/2022    1:53 PM 09/28/2021    3:07 PM 04/10/2016    5:00 AM  CMP  Glucose 70 - 99 mg/dL 741   287   BUN 6 - 20 mg/dL 13   <5   Creatinine 8.67 - 1.24 mg/dL 6.72   0.94   Sodium 709 - 145 mmol/L 137   137   Potassium 3.5 - 5.1 mmol/L 3.4   3.8   Chloride 98 - 111 mmol/L 100   108   CO2 22 - 32 mmol/L 22   23   Calcium 8.9 - 10.3 mg/dL 9.2   7.7   Total Protein 6.5 - 8.1 g/dL 7.0  7.6  5.2  Total Bilirubin 0.3 - 1.2 mg/dL 0.6  0.6  2.2   Alkaline Phos 38 - 126 U/L 137  182  90   AST 15 - 41 U/L 48  55  74   ALT 0 - 44 U/L 33  43  46      IMAGING:  [facility] 04/07/22: unremarkable  Imaging independently reviewed on April 17, 2022   Current Outpatient Medications on File Prior to Visit  Medication Sig Dispense Refill   ALPRAZolam (XANAX) 0.5 MG tablet take 1 tablet by oral route 2 times every day as needed 40 tablet 2   [START ON 04/19/2022] amphetamine-dextroamphetamine (ADDERALL) 20 MG tablet 1 Tablet tid 90 tablet 0   aspirin-acetaminophen-caffeine (EXCEDRIN MIGRAINE) 250-250-65 MG tablet Take 1 tablet by mouth 2 (two) times daily as needed for migraine.      cetirizine (ZYRTEC) 10 MG tablet Take 10 mg by mouth daily.     esomeprazole (NEXIUM) 40 MG capsule take 1 capsule by oral route  every day 90 capsule 3   fenofibrate 160 MG tablet TAKE 1 TABLET BY MOUTH EVERY DAY 90 tablet 3    magnesium oxide (MAG-OX) 400 MG tablet Take 1 tablet by mouth daily.     Multiple Vitamin (MULTIVITAMIN WITH MINERALS) TABS tablet Take 1 tablet by mouth daily.     omega-3 acid ethyl esters (LOVAZA) 1 g capsule take 2 capsule by oral route 2 times every day 360 capsule 1   oxyCODONE-acetaminophen (PERCOCET) 7.5-325 MG tablet take 1 tablet by oral route  every 12 hours as needed 60 tablet 0   albuterol (VENTOLIN HFA) 108 (90 Base) MCG/ACT inhaler Inhale 2 puffs into the lungs every 6 (six) hours as needed for wheezing or shortness of breath. 8.5 g 0   amphetamine-dextroamphetamine (ADDERALL) 20 MG tablet 1 Tablet tid (Patient not taking: Reported on 04/17/2022) 90 tablet 0   gemfibrozil (LOPID) 600 MG tablet Take 1 tablet (600 mg total) by mouth 2 (two) times daily before a meal. 60 tablet 1   oxyCODONE-acetaminophen (PERCOCET) 7.5-325 MG tablet take 1 tablet by oral route  every 12 hours as needed (Patient not taking: Reported on 04/17/2022) 60 tablet 0   [START ON 04/29/2022] oxyCODONE-acetaminophen (PERCOCET) 7.5-325 MG tablet take 1 tablet by oral route  every 12 hours as needed (Patient not taking: Reported on 04/17/2022) 60 tablet 0   No current facility-administered medications on file prior to visit.     Allergies: Allergies  Allergen Reactions   Imitrex [Sumatriptan] Swelling    Brain   Latex Other (See Comments)    Does not prefer   Benadryl [Diphenhydramine] Hives and Palpitations    Unsure, SOB    Family History: Family History  Problem Relation Age of Onset   Hypothyroidism Mother    Heart disease Mother    Diabetes Mother    ALS Father      Past Medical History: Past Medical History:  Diagnosis Date   Acute pancreatitis hospitalized 04/06/2016   Acute sinusitis    ADD (attention deficit disorder)    Anxiety    Blood in stool    Bronchitis    Chronic lower back pain    Depression    Dyslipidemia    Family history of adverse reaction to anesthesia    "mom PONV"  (04/07/2016)   Family history of colon cancer    Fatty liver    GERD (gastroesophageal reflux disease)    Headache    "@ least once/week" (04/07/2016)  Hypertriglyceridemia    Hypertriglyceridemia    Hypothyroidism (acquired)    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 Positive for headaches, paresthesias. All other systems reviewed and negative unless stated otherwise in HPI.   Physical Exam:   Vital Signs: BP (!) 142/92   Pulse 94   Ht 6\' 2"  (1.88 m)   Wt 215 lb 3.2 oz (97.6 kg)   BMI 27.63 kg/m  GENERAL: well appearing,in no acute distress,alert SKIN:  Color, texture, turgor normal. No rashes or lesions HEAD:  Normocephalic/atraumatic. CV:  RRR RESP: Normal respiratory effort MSK: no tenderness to palpation over occiput, neck, or shoulders  NEUROLOGICAL: Mental Status: Alert, oriented to person, place and time,Follows commands Cranial Nerves: PERRL, visual fields intact to confrontation, extraocular movements intact, slightly decreased sensation over right V1, no facial droop or ptosis, hearing grossly intact, no dysarthria Motor: muscle strength 5/5 both upper and lower extremities,no drift, normal tone Reflexes: 2+ throughout Sensation: intact to light touch all 4 extremities Coordination: Finger-to- nose-finger intact bilaterally Gait:  normal-based   IMPRESSION: 46 year old male with a history of GERD, GAD, hypertriglyceridemia c/b pancreatitis who presents for evaluation of an episode of headache, numbness, and confusion. 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  Will complete stroke/TIA workup given persistent decreased sensation over right V1 on exam and new episode of transient decreased vision which was not associated with headache. Will start Nurtec for migraine rescue (history of triptan allergy).  PLAN: -MRI brain, CTA head/neck -Blood work: LDL, A1c -TTE -Start Nurtec 75 mg PRN for migraines   I spent a total of 60 minutes chart reviewing and counseling the patient. Headache education was done. Discussed treatment options including preventive and acute medications, natural supplements, and physical therapy. Discussed medication overuse headache and to limit use of acute treatments to no more than 2 days/week or 10 days/month. Discussed medication side effects, adverse reactions and drug interactions. Written educational materials and patient instructions outlining all of the above were given.  Follow-up: 6 months   60, MD 04/17/2022   11:43 AM

## 2022-04-17 ENCOUNTER — Encounter: Payer: Self-pay | Admitting: Psychiatry

## 2022-04-17 ENCOUNTER — Ambulatory Visit (INDEPENDENT_AMBULATORY_CARE_PROVIDER_SITE_OTHER): Payer: 59 | Admitting: Psychiatry

## 2022-04-17 ENCOUNTER — Other Ambulatory Visit (HOSPITAL_BASED_OUTPATIENT_CLINIC_OR_DEPARTMENT_OTHER): Payer: Self-pay

## 2022-04-17 VITALS — BP 142/92 | HR 94 | Ht 74.0 in | Wt 215.2 lb

## 2022-04-17 DIAGNOSIS — R519 Headache, unspecified: Secondary | ICD-10-CM | POA: Diagnosis not present

## 2022-04-17 DIAGNOSIS — G459 Transient cerebral ischemic attack, unspecified: Secondary | ICD-10-CM | POA: Diagnosis not present

## 2022-04-17 DIAGNOSIS — R29818 Other symptoms and signs involving the nervous system: Secondary | ICD-10-CM

## 2022-04-17 MED ORDER — NURTEC 75 MG PO TBDP
75.0000 mg | ORAL_TABLET | ORAL | 6 refills | Status: AC | PRN
  Filled 2022-04-17: qty 8, 15d supply, fill #0
  Filled 2022-10-01: qty 8, 15d supply, fill #1
  Filled 2022-11-14: qty 8, 15d supply, fill #2

## 2022-04-17 NOTE — Patient Instructions (Signed)
TIA workup: MRI brain, CTA to look at blood vessels in head and neck, echocardiogram (ultrasound of the heart), Blood work to check blood sugar and cholesterol  Start Nurtec as needed for migraines. Take one pill at onset of migraine

## 2022-04-18 ENCOUNTER — Other Ambulatory Visit (HOSPITAL_BASED_OUTPATIENT_CLINIC_OR_DEPARTMENT_OTHER): Payer: Self-pay

## 2022-04-18 LAB — HEMOGLOBIN A1C
Est. average glucose Bld gHb Est-mCnc: 111 mg/dL
Hgb A1c MFr Bld: 5.5 % (ref 4.8–5.6)

## 2022-04-18 LAB — LDL CHOLESTEROL, DIRECT: LDL Direct: 102 mg/dL — ABNORMAL HIGH (ref 0–99)

## 2022-04-20 ENCOUNTER — Telehealth: Payer: Self-pay | Admitting: Psychiatry

## 2022-04-20 NOTE — Telephone Encounter (Signed)
CTA head & neck [facility] NPR case [id number] sent to GI  MRI brain UMR NPR case [id number] sent to GI

## 2022-04-24 ENCOUNTER — Telehealth: Payer: Self-pay | Admitting: Psychiatry

## 2022-04-24 ENCOUNTER — Other Ambulatory Visit (HOSPITAL_BASED_OUTPATIENT_CLINIC_OR_DEPARTMENT_OTHER): Payer: Self-pay

## 2022-04-24 NOTE — Telephone Encounter (Signed)
[facility] (Milly) Checking on status of pt's Nurtec. This is a confidential line, can leave a message. Case reference no: [id number]

## 2022-04-24 NOTE — Telephone Encounter (Signed)
Nurtec PA,  Key: [id number], [id number].  Your information has been sent to [facility].

## 2022-04-24 NOTE — Telephone Encounter (Signed)
Nurtec approved till 10/24/22. Faxed approval letter to pharmacy. Called Milly, and advised her of approval. She verbalized understanding, appreciation.

## 2022-04-27 ENCOUNTER — Other Ambulatory Visit (HOSPITAL_BASED_OUTPATIENT_CLINIC_OR_DEPARTMENT_OTHER): Payer: Self-pay

## 2022-05-01 ENCOUNTER — Other Ambulatory Visit (HOSPITAL_BASED_OUTPATIENT_CLINIC_OR_DEPARTMENT_OTHER): Payer: Self-pay

## 2022-05-02 ENCOUNTER — Other Ambulatory Visit (HOSPITAL_BASED_OUTPATIENT_CLINIC_OR_DEPARTMENT_OTHER): Payer: Self-pay

## 2022-05-04 ENCOUNTER — Other Ambulatory Visit (HOSPITAL_BASED_OUTPATIENT_CLINIC_OR_DEPARTMENT_OTHER): Payer: Self-pay

## 2022-05-08 ENCOUNTER — Other Ambulatory Visit (HOSPITAL_COMMUNITY): Payer: Self-pay

## 2022-05-08 ENCOUNTER — Other Ambulatory Visit (HOSPITAL_BASED_OUTPATIENT_CLINIC_OR_DEPARTMENT_OTHER): Payer: Self-pay

## 2022-05-09 ENCOUNTER — Other Ambulatory Visit (HOSPITAL_BASED_OUTPATIENT_CLINIC_OR_DEPARTMENT_OTHER): Payer: Self-pay

## 2022-05-17 DIAGNOSIS — R030 Elevated blood-pressure reading, without diagnosis of hypertension: Secondary | ICD-10-CM | POA: Diagnosis not present

## 2022-05-17 DIAGNOSIS — Z0001 Encounter for general adult medical examination with abnormal findings: Secondary | ICD-10-CM | POA: Diagnosis not present

## 2022-05-17 DIAGNOSIS — Z6827 Body mass index (BMI) 27.0-27.9, adult: Secondary | ICD-10-CM | POA: Diagnosis not present

## 2022-05-25 ENCOUNTER — Ambulatory Visit
Admission: RE | Admit: 2022-05-25 | Discharge: 2022-05-25 | Disposition: A | Discharge status: Home / Self Care | Payer: 59 | Source: Ambulatory Visit | Attending: Psychiatry | Admitting: Psychiatry

## 2022-05-25 DIAGNOSIS — R29818 Other symptoms and signs involving the nervous system: Secondary | ICD-10-CM | POA: Diagnosis not present

## 2022-05-25 DIAGNOSIS — R519 Headache, unspecified: Secondary | ICD-10-CM | POA: Diagnosis not present

## 2022-05-25 DIAGNOSIS — I6521 Occlusion and stenosis of right carotid artery: Secondary | ICD-10-CM | POA: Diagnosis not present

## 2022-05-25 DIAGNOSIS — G459 Transient cerebral ischemic attack, unspecified: Secondary | ICD-10-CM

## 2022-05-25 MED ORDER — GADOBENATE DIMEGLUMINE 529 MG/ML IV SOLN
20.0000 mL | Freq: Once | INTRAVENOUS | Status: AC | PRN
  Administered 2022-05-25: 20 mL via INTRAVENOUS

## 2022-05-25 MED ORDER — IOPAMIDOL (ISOVUE-370) INJECTION 76%
80.0000 mL | Freq: Once | INTRAVENOUS | Status: AC | PRN
Start: 2022-05-25 — End: 2022-05-25
  Administered 2022-05-25: 80 mL via INTRAVENOUS

## 2022-06-19 DIAGNOSIS — R051 Acute cough: Secondary | ICD-10-CM | POA: Diagnosis not present

## 2022-06-19 DIAGNOSIS — Z20822 Contact with and (suspected) exposure to covid-19: Secondary | ICD-10-CM | POA: Diagnosis not present

## 2022-06-19 DIAGNOSIS — J019 Acute sinusitis, unspecified: Secondary | ICD-10-CM | POA: Diagnosis not present

## 2022-06-28 ENCOUNTER — Ambulatory Visit (INDEPENDENT_AMBULATORY_CARE_PROVIDER_SITE_OTHER): Payer: 59

## 2022-06-28 ENCOUNTER — Ambulatory Visit
Admission: RE | Admit: 2022-06-28 | Discharge: 2022-06-28 | Disposition: A | Discharge status: Home / Self Care | Payer: 59 | Source: Ambulatory Visit | Attending: Urgent Care | Admitting: Urgent Care

## 2022-06-28 ENCOUNTER — Other Ambulatory Visit (HOSPITAL_BASED_OUTPATIENT_CLINIC_OR_DEPARTMENT_OTHER): Payer: Self-pay

## 2022-06-28 VITALS — BP 152/92 | HR 95 | Temp 99.2°F

## 2022-06-28 DIAGNOSIS — J189 Pneumonia, unspecified organism: Secondary | ICD-10-CM | POA: Diagnosis not present

## 2022-06-28 DIAGNOSIS — R059 Cough, unspecified: Secondary | ICD-10-CM | POA: Diagnosis not present

## 2022-06-28 MED ORDER — PROMETHAZINE-DM 6.25-15 MG/5ML PO SYRP
2.5000 mL | ORAL_SOLUTION | Freq: Three times a day (TID) | ORAL | 0 refills | Status: DC | PRN
Start: 2022-06-28 — End: 2022-07-08
  Filled 2022-06-28: qty 100, 14d supply, fill #0

## 2022-06-28 MED ORDER — AMOXICILLIN-POT CLAVULANATE 875-125 MG PO TABS
1.0000 | ORAL_TABLET | Freq: Two times a day (BID) | ORAL | 0 refills | Status: DC
  Filled 2022-06-28: qty 20, 10d supply, fill #0

## 2022-06-28 NOTE — ED Provider Notes (Signed)
[facility]  Note:  This document was prepared using Systems analyst and may include unintentional dictation errors.  MRN: [id number] DOB: [birth year]  Subjective:   Connor Martin is a 46 y.o. male presenting for 3 week history of persistent coughing, chest congestion. Has had sick contacts. Has tested negative for COVID 19. No history of asthma. No smoking, no vaping.   No current facility-administered medications for this encounter.  Current Outpatient Medications:    ALPRAZolam (XANAX) 0.5 MG tablet, take 1 tablet by oral route 2 times every day as needed, Disp: 40 tablet, Rfl: 2   amphetamine-dextroamphetamine (ADDERALL) 20 MG tablet, 1 Tablet tid, Disp: 90 tablet, Rfl: 0   aspirin-acetaminophen-caffeine (EXCEDRIN MIGRAINE) 250-250-65 MG tablet, Take 1 tablet by mouth 2 (two) times daily as needed for migraine. , Disp: , Rfl:    cetirizine (ZYRTEC) 10 MG tablet, Take 10 mg by mouth daily., Disp: , Rfl:    esomeprazole (NEXIUM) 40 MG capsule, take 1 capsule by oral route  every day, Disp: 90 capsule, Rfl: 3   fenofibrate 160 MG tablet, TAKE 1 TABLET BY MOUTH EVERY DAY, Disp: 90 tablet, Rfl: 3   magnesium oxide (MAG-OX) 400 MG tablet, Take 1 tablet by mouth daily., Disp: , Rfl:    Multiple Vitamin (MULTIVITAMIN WITH MINERALS) TABS tablet, Take 1 tablet by mouth daily., Disp: , Rfl:    omega-3 acid ethyl esters (LOVAZA) 1 g capsule, take 2 capsule by oral route 2 times every day, Disp: 360 capsule, Rfl: 1   oxyCODONE-acetaminophen (PERCOCET) 7.5-325 MG tablet, take 1 tablet by oral route  every 12 hours as needed, Disp: 60 tablet, Rfl: 0   Rimegepant Sulfate (NURTEC) 75 MG TBDP, Take 75 mg by mouth as needed., Disp: 8 tablet, Rfl: 6   Allergies  Allergen Reactions   Imitrex [Sumatriptan] Swelling    Brain   Latex Other (See Comments)    Does not prefer   Benadryl [Diphenhydramine] Hives and Palpitations    Unsure, SOB    Past Medical  History:  Diagnosis Date   Acute pancreatitis hospitalized 04/06/2016   Acute sinusitis    ADD (attention deficit disorder)    Anxiety    Blood in stool    Bronchitis    Chronic lower back pain    Depression    Dyslipidemia    Family history of adverse reaction to anesthesia    "mom PONV" (04/07/2016)   Family history of colon cancer    Fatty liver    GERD (gastroesophageal reflux disease)    Headache    "@ least once/week" (04/07/2016)   Hypertriglyceridemia    Hypertriglyceridemia    Hypothyroidism (acquired)    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    Migraine    "q other month" (04/07/2016)   Nasal congestion    Palpitations    Pancreatitis    Panic attacks    Pneumonia ~ 1981   Sleep apnea    Snoring      Past Surgical History:  Procedure Laterality Date   TONSILLECTOMY AND ADENOIDECTOMY  ~ 1990    Family History  Problem Relation Age of Onset   Hypothyroidism Mother    Heart disease Mother    Diabetes Mother    ALS Father     Social History   Tobacco Use   Smoking status: Never   Smokeless tobacco: Never  Substance Use Topics   Alcohol use: Yes    Alcohol/week: 10.0  standard drinks of alcohol    Types: 10 Shots of liquor per week    Comment: 04/17/22 10 drinks a week   Drug use: No    ROS   Objective:   Vitals: BP (!) 152/92 (BP Location: Right Arm)   Pulse 95   Temp 99.2 F (37.3 C) (Oral)   SpO2 91%   Physical Exam Constitutional:      General: He is not in acute distress.    Appearance: Normal appearance. He is well-developed. He is not ill-appearing, toxic-appearing or diaphoretic.  HENT:     Head: Normocephalic and atraumatic.     Right Ear: External ear normal.     Left Ear: External ear normal.     Nose: Nose normal.     Mouth/Throat:     Mouth: Mucous membranes are moist.  Eyes:     General: No scleral icterus.       Right eye: No discharge.        Left eye: No discharge.     Extraocular Movements: Extraocular movements  intact.  Cardiovascular:     Rate and Rhythm: Normal rate and regular rhythm.     Heart sounds: Normal heart sounds. No murmur heard.    No friction rub. No gallop.  Pulmonary:     Effort: Pulmonary effort is normal. No respiratory distress.     Breath sounds: No stridor. Examination of the left-middle field reveals rales. Examination of the left-lower field reveals rales. Rales present. No wheezing or rhonchi.  Neurological:     Mental Status: He is alert and oriented to person, place, and time.  Psychiatric:        Mood and Affect: Mood normal.        Behavior: Behavior normal.        Thought Content: Thought content normal.     DG Chest 2 View  Result Date: 06/28/2022 CLINICAL DATA:  Productive cough over the last 3 weeks EXAM: CHEST - 2 VIEW COMPARISON:  04/07/2016 FINDINGS: Patchy accentuated markings at the left lung base likely with a component of airspace opacity, suspicious for left lower lobe pneumonia. The lungs appear otherwise clear. Cardiac and mediastinal margins appear normal. No blunting of the costophrenic angles. IMPRESSION: Patchy left lower lobe airspace opacity suspicious for pneumonia. Electronically Signed   By: Gaylyn Rong M.D.   On: 06/28/2022 16:48     Assessment and Plan :   PDMP not reviewed this encounter.  1. Pneumonia of left lower lobe due to infectious organism     Will start treatment with Augmentin for CAP of left lower lobe.  Recommended supportive care otherwise. Counseled patient on potential for adverse effects with medications prescribed/recommended today, ER and return-to-clinic precautions discussed, patient verbalized understanding.    Wallis Bamberg, [place] 06/28/22 1656

## 2022-06-28 NOTE — ED Triage Notes (Signed)
Pt states cough for the past 3 weeks 

## 2022-07-03 ENCOUNTER — Other Ambulatory Visit (HOSPITAL_BASED_OUTPATIENT_CLINIC_OR_DEPARTMENT_OTHER): Payer: Self-pay

## 2022-07-03 DIAGNOSIS — R051 Acute cough: Secondary | ICD-10-CM | POA: Diagnosis not present

## 2022-07-03 MED ORDER — PREDNISONE 10 MG PO TABS
ORAL_TABLET | ORAL | 0 refills | Status: DC
  Filled 2022-07-03: qty 21, 6d supply, fill #0

## 2022-07-03 MED ORDER — ALBUTEROL SULFATE HFA 108 (90 BASE) MCG/ACT IN AERS
2.0000 | INHALATION_SPRAY | RESPIRATORY_TRACT | 0 refills | Status: DC
  Filled 2022-07-03: qty 6.7, 17d supply, fill #0

## 2022-07-08 ENCOUNTER — Telehealth: Payer: 59 | Admitting: Physician Assistant

## 2022-07-08 ENCOUNTER — Other Ambulatory Visit (HOSPITAL_BASED_OUTPATIENT_CLINIC_OR_DEPARTMENT_OTHER): Payer: Self-pay

## 2022-07-08 DIAGNOSIS — J189 Pneumonia, unspecified organism: Secondary | ICD-10-CM

## 2022-07-08 MED ORDER — FLUTICASONE PROPIONATE HFA 110 MCG/ACT IN AERO: 2.0000 | INHALATION_SPRAY | Freq: Two times a day (BID) | RESPIRATORY_TRACT | 12 refills | Status: DC

## 2022-07-08 MED ORDER — PROMETHAZINE-DM 6.25-15 MG/5ML PO SYRP: 5.0000 mL | ORAL_SOLUTION | Freq: Four times a day (QID) | ORAL | 0 refills | Status: DC | PRN

## 2022-07-08 MED ORDER — BENZONATATE 100 MG PO CAPS: 100.0000 mg | ORAL_CAPSULE | Freq: Three times a day (TID) | ORAL | 0 refills | Status: DC | PRN

## 2022-07-08 MED ORDER — LEVOFLOXACIN 750 MG PO TABS: 750.0000 mg | ORAL_TABLET | Freq: Every day | ORAL | 0 refills | Status: DC

## 2022-07-08 NOTE — Progress Notes (Signed)
Virtual Visit Consent   Connor Martin, you are scheduled for a virtual visit with a [facility] provider today. Just as with appointments in the office, your consent must be obtained to participate. Your consent will be active for this visit and any virtual visit you may have with one of our providers in the next 365 days. If you have a MyChart account, a copy of this consent can be sent to you electronically.  As this is a virtual visit, video technology does not allow for your provider to perform a traditional examination. This may limit your provider's ability to fully assess your condition. If your provider identifies any concerns that need to be evaluated in person or the need to arrange testing (such as labs, EKG, etc.), we will make arrangements to do so. Although advances in technology are sophisticated, we cannot ensure that it will always work on either your end or our end. If the connection with a video visit is poor, the visit may have to be switched to a telephone visit. With either a video or telephone visit, we are not always able to ensure that we have a secure connection.  By engaging in this virtual visit, you consent to the provision of healthcare and authorize for your insurance to be billed (if applicable) for the services provided during this visit. Depending on your insurance coverage, you may receive a charge related to this service.  I need to obtain your verbal consent now. Are you willing to proceed with your visit today? Connor Martin has provided verbal consent on 07/08/2022 for a virtual visit (video or telephone). Mar Daring, PA-C  Date: 07/08/2022 4:30 PM  Virtual Visit via Video Note   I, Mar Daring, connected with  Connor Martin  ([id number], August 19, 1976) on 07/08/22 at  4:00 PM EDT by a video-enabled telemedicine application and verified that I am speaking with the correct person using two identifiers.  Location: Patient: Virtual Visit  Location Patient: Home Provider: Virtual Visit Location Provider: Home Office   I discussed the limitations of evaluation and management by telemedicine and the availability of in person appointments. The patient expressed understanding and agreed to proceed.    History of Present Illness: Connor Martin is a 46 y.o. who identifies as a male who was assigned male at birth, and is being seen today for pneumonia follow up. Was seen on 06/28/22 in person Urgent care and was diagnosed with left lower lobe pneumonia. Placed on Augmentin x 10 days and Promethazine DM cough syrup. Completes Augmentin tomorrow. Went to local UC (not [facility]) on 07/03/22 and was given albuterol, prednisone 6 day taper, and was given 2 breathing treatments. Has been having some windedness and coughing spells. Reports cough is worse with talking. Feels he is about 20% better.  O2 sat is 94% (ranges between 94-96%) and HR was 95. Temp 98.2.   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   Adjustment disorder with depressed mood 02/25/2020   Nutritional anemia 02/25/2020   Fatty liver 09/02/2016   History of panic attacks 08/30/2016   GAD (generalized anxiety disorder) 08/30/2016   Gastroesophageal reflux disease without esophagitis    High blood triglycerides    Pancreatitis 04/07/2016   Abnormal LFTs 04/06/2016   Alcohol use 04/06/2016   Cough 04/06/2016   Pancreatitis, acute 04/06/2016   Panic attacks    GERD (gastroesophageal  reflux disease)     Allergies:  Allergies  Allergen Reactions   Imitrex [Sumatriptan] Swelling    Brain   Latex Other (See Comments)    Does not prefer   Benadryl [Diphenhydramine] Hives and Palpitations    Unsure, SOB   Medications:  Current Outpatient Medications:    benzonatate (TESSALON) 100 MG capsule, Take 1 capsule (100 mg total) by mouth 3 (three) times daily as needed.,  Disp: 30 capsule, Rfl: 0   fluticasone (FLOVENT HFA) 110 MCG/ACT inhaler, Inhale 2 puffs into the lungs in the morning and at bedtime., Disp: 1 each, Rfl: 12   levofloxacin (LEVAQUIN) 750 MG tablet, Take 1 tablet (750 mg total) by mouth daily., Disp: 7 tablet, Rfl: 0   promethazine-dextromethorphan (PROMETHAZINE-DM) 6.25-15 MG/5ML syrup, Take 5 mLs by mouth 4 (four) times daily as needed., Disp: 118 mL, Rfl: 0   albuterol (VENTOLIN HFA) 108 (90 Base) MCG/ACT inhaler, Inhale 2 puffs into the lungs every 4 (four) hours as needed for difficulty breath., Disp: 6.7 g, Rfl: 0   ALPRAZolam (XANAX) 0.5 MG tablet, take 1 tablet by oral route 2 times every day as needed, Disp: 40 tablet, Rfl: 2   amoxicillin-clavulanate (AUGMENTIN) 875-125 MG tablet, Take 1 tablet by mouth 2 (two) times daily., Disp: 20 tablet, Rfl: 0   amphetamine-dextroamphetamine (ADDERALL) 20 MG tablet, 1 Tablet tid, Disp: 90 tablet, Rfl: 0   aspirin-acetaminophen-caffeine (EXCEDRIN MIGRAINE) 250-250-65 MG tablet, Take 1 tablet by mouth 2 (two) times daily as needed for migraine. , Disp: , Rfl:    cetirizine (ZYRTEC) 10 MG tablet, Take 10 mg by mouth daily., Disp: , Rfl:    esomeprazole (NEXIUM) 40 MG capsule, take 1 capsule by oral route  every day, Disp: 90 capsule, Rfl: 3   fenofibrate 160 MG tablet, TAKE 1 TABLET BY MOUTH EVERY DAY, Disp: 90 tablet, Rfl: 3   magnesium oxide (MAG-OX) 400 MG tablet, Take 1 tablet by mouth daily., Disp: , Rfl:    Multiple Vitamin (MULTIVITAMIN WITH MINERALS) TABS tablet, Take 1 tablet by mouth daily., Disp: , Rfl:    omega-3 acid ethyl esters (LOVAZA) 1 g capsule, take 2 capsule by oral route 2 times every day, Disp: 360 capsule, Rfl: 1   oxyCODONE-acetaminophen (PERCOCET) 7.5-325 MG tablet, take 1 tablet by oral route  every 12 hours as needed, Disp: 60 tablet, Rfl: 0   predniSONE (DELTASONE) 10 MG tablet, Take as directed on attached paper for 6 days., Disp: 21 tablet, Rfl: 0   Rimegepant Sulfate  (NURTEC) 75 MG TBDP, Take 75 mg by mouth as needed., Disp: 8 tablet, Rfl: 6  Observations/Objective: Patient is well-developed, well-nourished in no acute distress.  Resting comfortably at home.  Head is normocephalic, atraumatic.  No labored breathing.  Speech is clear and coherent with logical content.  Patient is alert and oriented at baseline.  Dry, frequent cough heard while talking but not inhibiting speech much  Assessment and Plan: 1. Pneumonia of left lower lobe due to infectious organism - promethazine-dextromethorphan (PROMETHAZINE-DM) 6.25-15 MG/5ML syrup; Take 5 mLs by mouth 4 (four) times daily as needed.  Dispense: 118 mL; Refill: 0 - benzonatate (TESSALON) 100 MG capsule; Take 1 capsule (100 mg total) by mouth 3 (three) times daily as needed.  Dispense: 30 capsule; Refill: 0 - levofloxacin (LEVAQUIN) 750 MG tablet; Take 1 tablet (750 mg total) by mouth daily.  Dispense: 7 tablet; Refill: 0 - fluticasone (FLOVENT HFA) 110 MCG/ACT inhaler; Inhale 2 puffs into the lungs in  the morning and at bedtime.  Dispense: 1 each; Refill: 12  - STOP Augmentin - START Levaquin (Levofloxacin); 2 hours separated from dairy products - Promethazine DM refilled (can reserve for nighttime cough) - Tessalon (Benzonatate) perles added - Can use together with other cough suppressants - Delsym cough medication is okay for daytime cough (Do NOT Take Promethazine DM and Deslym at the same time, okay for separate doses timed apart) - Guaifenesin (Mucinex) can be taking twice daily for congestion - Prednisone 10mg  Take 2 tablets (20mg ) daily for next 5 days - Continue Albuterol inhaler as prescribed - Flovent (inhaled corticosteroid) inhaler added twice daily; rinse mouth after use to reduce risk of thrush - Steam and humidifier (especially in bedroom at night) can help cough - Eucalyptus and/or echinacea essential oils can help (place a drop or two in hot water on stove top and bring to steam - not  boiling - and place towel over head and breathe in nose and out mouth slowly) - Hot teas and liquids can help cough and congestion - Rest as needed - Push fluids, electrolyte beverages - If not improving in next 48 hours or if symptoms worsen or Oxygen drops to 90% or lower seek immediate care at the closest ER - Recommended follow up in 6 weeks for repeat chest x-ray to confirm clearance  Follow Up Instructions: I discussed the assessment and treatment plan with the patient. The patient was provided an opportunity to ask questions and all were answered. The patient agreed with the plan and demonstrated an understanding of the instructions.  A copy of instructions were sent to the patient via MyChart unless otherwise noted below.    The patient was advised to call back or seek an in-person evaluation if the symptoms worsen or if the condition fails to improve as anticipated.  Time:  I spent 34 minutes with the patient via telehealth technology discussing the above problems/concerns.    Mar Daring, PA-C

## 2022-07-08 NOTE — Patient Instructions (Signed)
Connor Martin, thank you for joining Margaretann Loveless, PA-C for today's virtual visit.  While this provider is not your primary care provider (PCP), if your PCP is located in our provider database this encounter information will be shared with them immediately following your visit.  A [facility] MyChart account gives you access to today's visit and all your visits, tests, and labs performed at [facility] " click here if you don't have a [facility] MyChart account or go to [email or web address]  Consent: (Patient) Connor Martin provided verbal consent for this virtual visit at the beginning of the encounter.  Current Medications:  Current Outpatient Medications:    benzonatate (TESSALON) 100 MG capsule, Take 1 capsule (100 mg total) by mouth 3 (three) times daily as needed., Disp: 30 capsule, Rfl: 0   fluticasone (FLOVENT HFA) 110 MCG/ACT inhaler, Inhale 2 puffs into the lungs in the morning and at bedtime., Disp: 1 each, Rfl: 12   levofloxacin (LEVAQUIN) 750 MG tablet, Take 1 tablet (750 mg total) by mouth daily., Disp: 7 tablet, Rfl: 0   promethazine-dextromethorphan (PROMETHAZINE-DM) 6.25-15 MG/5ML syrup, Take 5 mLs by mouth 4 (four) times daily as needed., Disp: 118 mL, Rfl: 0   albuterol (VENTOLIN HFA) 108 (90 Base) MCG/ACT inhaler, Inhale 2 puffs into the lungs every 4 (four) hours as needed for difficulty breath., Disp: 6.7 g, Rfl: 0   ALPRAZolam (XANAX) 0.5 MG tablet, take 1 tablet by oral route 2 times every day as needed, Disp: 40 tablet, Rfl: 2   amoxicillin-clavulanate (AUGMENTIN) 875-125 MG tablet, Take 1 tablet by mouth 2 (two) times daily., Disp: 20 tablet, Rfl: 0   amphetamine-dextroamphetamine (ADDERALL) 20 MG tablet, 1 Tablet tid, Disp: 90 tablet, Rfl: 0   aspirin-acetaminophen-caffeine (EXCEDRIN MIGRAINE) 250-250-65 MG tablet, Take 1 tablet by mouth 2 (two) times daily as needed for migraine. , Disp: , Rfl:    cetirizine (ZYRTEC) 10 MG tablet, Take  10 mg by mouth daily., Disp: , Rfl:    esomeprazole (NEXIUM) 40 MG capsule, take 1 capsule by oral route  every day, Disp: 90 capsule, Rfl: 3   fenofibrate 160 MG tablet, TAKE 1 TABLET BY MOUTH EVERY DAY, Disp: 90 tablet, Rfl: 3   magnesium oxide (MAG-OX) 400 MG tablet, Take 1 tablet by mouth daily., Disp: , Rfl:    Multiple Vitamin (MULTIVITAMIN WITH MINERALS) TABS tablet, Take 1 tablet by mouth daily., Disp: , Rfl:    omega-3 acid ethyl esters (LOVAZA) 1 g capsule, take 2 capsule by oral route 2 times every day, Disp: 360 capsule, Rfl: 1   oxyCODONE-acetaminophen (PERCOCET) 7.5-325 MG tablet, take 1 tablet by oral route  every 12 hours as needed, Disp: 60 tablet, Rfl: 0   predniSONE (DELTASONE) 10 MG tablet, Take as directed on attached paper for 6 days., Disp: 21 tablet, Rfl: 0   Rimegepant Sulfate (NURTEC) 75 MG TBDP, Take 75 mg by mouth as needed., Disp: 8 tablet, Rfl: 6   Medications ordered in this encounter:  Meds ordered this encounter  Medications   promethazine-dextromethorphan (PROMETHAZINE-DM) 6.25-15 MG/5ML syrup    Sig: Take 5 mLs by mouth 4 (four) times daily as needed.    Dispense:  118 mL    Refill:  0    Order Specific Question:   Supervising Provider    Answer:   Merrilee Jansky [[id number]]   benzonatate (TESSALON) 100 MG capsule    Sig: Take 1 capsule (100 mg total) by mouth 3 (three) times daily  as needed.    Dispense:  30 capsule    Refill:  0    Order Specific Question:   Supervising Provider    Answer:   Merrilee Jansky [[id number]]   levofloxacin (LEVAQUIN) 750 MG tablet    Sig: Take 1 tablet (750 mg total) by mouth daily.    Dispense:  7 tablet    Refill:  0    Order Specific Question:   Supervising Provider    Answer:   Merrilee Jansky [[id number]]   fluticasone (FLOVENT HFA) 110 MCG/ACT inhaler    Sig: Inhale 2 puffs into the lungs in the morning and at bedtime.    Dispense:  1 each    Refill:  12    Order Specific Question:   Supervising Provider     Answer:   Merrilee Jansky [id number]     *If you need refills on other medications prior to your next appointment, please contact your pharmacy*  Follow-Up: Call back or seek an in-person evaluation if the symptoms worsen or if the condition fails to improve as anticipated.  [facility] [phone]  Other Instructions  - STOP Augmentin - START Levaquin (Levofloxacin); 2 hours separated from dairy products - Promethazine DM refilled (can reserve for nighttime cough) - Tessalon (Benzonatate) perles added - Can use together with other cough suppressants - Delsym cough medication is okay for daytime cough (Do NOT Take Promethazine DM and Deslym at the same time, okay for separate doses timed apart) - Guaifenesin (Mucinex) can be taking twice daily for congestion - Prednisone 10mg  Take 2 tablets (20mg ) daily for next 5 days - Continue Albuterol inhaler as prescribed - Flovent (inhaled corticosteroid) inhaler added twice daily; rinse mouth after use to reduce risk of thrush - Steam and humidifier (especially in bedroom at night) can help cough - Eucalyptus and/or echinacea essential oils can help (place a drop or two in hot water on stove top and bring to steam - not boiling - and place towel over head and breathe in nose and out mouth slowly) - Hot teas and liquids can help cough and congestion - Rest as needed - Push fluids, electrolyte beverages - If not improving in next 48 hours or if symptoms worsen or Oxygen drops to 90% or lower seek immediate care at the closest ER - Recommended follow up in 6 weeks for repeat chest x-ray to confirm clearance  Community-Acquired Pneumonia, Adult Pneumonia is an infection of the lungs. It causes irritation and swelling in the airways of the lungs. Mucus and fluid may also build up inside the airways. This may cause coughing and trouble breathing. One type of pneumonia can happen while you are in a hospital. A different type can  happen when you are not in a hospital (community-acquired pneumonia). What are the causes?  This condition is caused by germs (viruses, bacteria, or fungi). Some types of germs can spread from person to person. Pneumonia is not thought to spread from person to person. What increases the risk? You have a long-term (chronic) disease, such as: Disease of the lungs. This may be chronic obstructive pulmonary disease (COPD) or asthma. Heart failure. Cystic fibrosis. Diabetes. Kidney disease. Sickle cell disease. HIV. You have other health problems, such as: Your body's defense system (immune system) is weak. A condition that may cause you to breathe in fluids from your mouth and nose. You had your spleen taken out. You do not take good care of your teeth and  mouth (poor dental hygiene). You use or have used tobacco products. You go where the germs that cause this illness are common. You are older than 45 years of age. What are the signs or symptoms? A cough. A fever. Sweating or chills. Chest pain, often when you breathe deeply or cough. Breathing problems, such as: Fast breathing. Trouble breathing. Shortness of breath. Feeling tired (fatigued). Muscle aches. How is this treated? Treatment for this condition depends on many things, such as: The cause of your illness. Your medicines. Your other health problems. Most adults can be treated at home. Sometimes, treatment must happen in a hospital. Treatment may include medicines to kill germs. Medicines may depend on which germ caused your illness. Very bad pneumonia is rare. If you get it, you may: Have a machine to help you breathe. Have fluid taken away from around your lungs. Follow these instructions at home: Medicines Take over-the-counter and prescription medicines only as told by your doctor. Take cough medicine only if you are losing sleep. Cough medicine can keep your body from taking mucus away from your lungs. If you  were prescribed antibiotics, take them as told by your doctor. Do not stop taking them even if you start to feel better. Lifestyle     Do not smoke or use any products that contain nicotine or tobacco. If you need help quitting, ask your doctor. Do not drink alcohol. Eat a healthy diet. This includes a lot of vegetables, fruits, whole grains, low-fat dairy products, and low-fat (lean) protein. General instructions  Rest a lot. Sleep for at least 8 hours each night. Sleep with your head and neck raised. Put a few pillows under your head or sleep in a reclining chair. Return to your normal activities as told by your doctor. Ask your doctor what activities are safe for you. Drink enough fluid to keep your pee (urine) pale yellow. If your throat is sore, gargle with a mixture of salt and water 3-4 times a day or as needed. To make salt water, completely dissolve -1 tsp (3-6 g) of salt in 1 cup (237 mL) of warm water. Keep all follow-up visits. How is this prevented? Getting the pneumonia shot (vaccine). These shots have different types and schedules. Ask your doctor what works best for you. Think about getting this shot if: You are older than 45 years of age. You are 26-7 years of age and: You are being treated for cancer. You have long-term lung disease. You have other problems that affect your body's defense system. Ask your doctor if you have one of these. Getting your flu shot every year. Ask your doctor which type of shot is best for you. Going to the dentist as often as told. Washing your hands often with soap and water for at least 20 seconds. If you cannot use soap and water, use hand sanitizer. Contact a doctor if: You have a fever. You lose sleep because your cough medicine does not help. Get help right away if: You are short of breath and this gets worse. You have more chest pain. Your sickness gets worse. This is very serious if: You are an older adult. Your body's defense  system is weak. You cough up blood. These symptoms may be an emergency. Get help right away. Call 911. Do not wait to see if the symptoms will go away. Do not drive yourself to the hospital. Summary Pneumonia is an infection of the lungs. Community-acquired pneumonia affects people who have not been  in the hospital. Certain germs can cause this infection. This condition may be treated with medicines that kill germs. For very bad pneumonia, you may need a hospital stay and treatment to help with breathing. This information is not intended to replace advice given to you by your health care provider. Make sure you discuss any questions you have with your health care provider. Document Revised: 11/09/2021 Document Reviewed: 11/09/2021 Elsevier Patient Education  [place].   If you have been instructed to have an in-person evaluation today at a local Urgent Care facility, please use the link below. It will take you to a list of all of our available [facility], including address, phone number and hours of operation. Please do not delay care.  [facility]  If you or a family member do not have a primary care provider, use the link below to schedule a visit and establish care. When you choose a [facility] primary care physician or advanced practice provider, you gain a long-term partner in health. Find a Primary Care Provider  Learn more about [facility]'s in-office and virtual care options: [facility]

## 2022-07-27 ENCOUNTER — Ambulatory Visit (HOSPITAL_COMMUNITY)
Admission: EM | Admit: 2022-07-27 | Discharge: 2022-07-27 | Disposition: A | Discharge status: Home / Self Care | Payer: 59 | Attending: Internal Medicine | Admitting: Internal Medicine

## 2022-07-27 ENCOUNTER — Encounter (HOSPITAL_COMMUNITY): Payer: Self-pay | Admitting: *Deleted

## 2022-07-27 ENCOUNTER — Other Ambulatory Visit (HOSPITAL_BASED_OUTPATIENT_CLINIC_OR_DEPARTMENT_OTHER): Payer: Self-pay

## 2022-07-27 DIAGNOSIS — J301 Allergic rhinitis due to pollen: Secondary | ICD-10-CM

## 2022-07-27 DIAGNOSIS — R052 Subacute cough: Secondary | ICD-10-CM

## 2022-07-27 MED ORDER — FLUTICASONE PROPIONATE 50 MCG/ACT NA SUSP
2.0000 | Freq: Every day | NASAL | 2 refills | Status: DC
  Filled 2022-07-27 – 2022-08-23 (×2): qty 16, 30d supply, fill #0

## 2022-07-27 MED ORDER — LEVOCETIRIZINE DIHYDROCHLORIDE 5 MG PO TABS
5.0000 mg | ORAL_TABLET | Freq: Every evening | ORAL | 1 refills | Status: DC
Start: 2022-07-27 — End: 2022-12-06
  Filled 2022-07-27 – 2022-08-23 (×2): qty 30, 30d supply, fill #0
  Filled 2022-10-01 – 2022-10-02 (×2): qty 30, 30d supply, fill #1

## 2022-07-27 NOTE — ED Triage Notes (Signed)
Pt states that he is here to follow up from pneumonia. He still has cough and congestion. He did see PCP after our last visit and they changed his antibiotic and he finished the new one and the Augmentin that he has given at [facility]. He isnt SOB anymore but he is still coughing and wants a repeat xray.

## 2022-07-27 NOTE — ED Provider Notes (Signed)
[facility]    CSN: [id number] Arrival date & time: 07/27/22  1020      History   Chief Complaint Chief Complaint  Patient presents with   Follow-up    HPI Connor Martin is a 46 y.o. male.   Patient presents to urgent care for evaluation of ongoing cough and nasal congestion after he was diagnosed with pneumonia approximately 6 weeks ago.  Patient was initially placed on Augmentin antibiotic when acute pneumonia was first discovered by chest x-ray, then antibiotic was changed to levofloxacin due to lack of improvement with Augmentin.  Patient finished all of his antibiotics a few weeks ago and states that the cough fully improved, then the cough came back a few days ago.  Cough is dry and nonproductive, causing him to experience some voice hoarseness, and is associated with some slight nasal congestion and drainage.  Denies recent fever/chills, nausea, vomiting, body aches, fatigue, vision changes, ear pain, sore throat, shortness of breath, chest pain, hemoptysis, or weakness.  Reports history of allergic rhinitis without history of asthma.  No recent exposure to known allergens or sick contacts.  He currently takes Zyrtec and has been taking this medication for allergic rhinitis for the last couple of years.  He states that he feels as though the Zyrtec is not working very well to manage his symptoms anymore.  He does not currently take Flonase or any nasal decongestant medications.  Patient is requesting a repeat chest x-ray today to ensure that his new symptoms are not related to a lingering pneumonia infection.     Past Medical History:  Diagnosis Date   Acute pancreatitis hospitalized 04/06/2016   Acute sinusitis    ADD (attention deficit disorder)    Anxiety    Blood in stool    Bronchitis    Chronic lower back pain    Depression    Dyslipidemia    Family history of adverse reaction to anesthesia    "mom PONV" (04/07/2016)   Family history of colon cancer     Fatty liver    GERD (gastroesophageal reflux disease)    Headache    "@ least once/week" (04/07/2016)   Hypertriglyceridemia    Hypertriglyceridemia    Hypothyroidism (acquired)    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    Migraine    "q other month" (04/07/2016)   Nasal congestion    Palpitations    Pancreatitis    Panic attacks    Pneumonia ~ 1981   Sleep apnea    Snoring     Patient Active Problem List   Diagnosis Date Noted   Dyslipidemia 02/25/2020   ADHD, 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   Adjustment disorder with depressed mood 02/25/2020   Nutritional anemia 02/25/2020   Fatty liver 09/02/2016   History of panic attacks 08/30/2016   GAD (generalized anxiety disorder) 08/30/2016   Gastroesophageal reflux disease without esophagitis    High blood triglycerides    Pancreatitis 04/07/2016   Abnormal LFTs 04/06/2016   Alcohol use 04/06/2016   Cough 04/06/2016   Pancreatitis, acute 04/06/2016   Panic attacks    GERD (gastroesophageal reflux disease)     Past Surgical History:  Procedure Laterality Date   TONSILLECTOMY AND ADENOIDECTOMY  ~ 1990       Home Medications    Prior to Admission medications   Medication Sig Start Date End Date Taking? Authorizing Provider  albuterol (VENTOLIN HFA) 108 (90 Base) MCG/ACT inhaler Inhale 2 puffs into the lungs  every 4 (four) hours as needed for difficulty breath. 07/03/22  Yes   ALPRAZolam (XANAX) 0.5 MG tablet take 1 tablet by oral route 2 times every day as needed 03/20/22  Yes   amphetamine-dextroamphetamine (ADDERALL) 20 MG tablet 1 Tablet tid 04/19/22  Yes   esomeprazole (NEXIUM) 40 MG capsule take 1 capsule by oral route  every day 10/10/21  Yes   fenofibrate 160 MG tablet TAKE 1 TABLET BY MOUTH EVERY DAY 10/10/21  Yes   fluticasone (FLONASE) 50 MCG/ACT nasal spray Place 2 sprays into both nostrils daily. 07/27/22  Yes Carlisle Beers, FNP  levocetirizine (XYZAL ALLERGY 24HR) 5 MG tablet Take 1 tablet (5  mg total) by mouth every evening. 07/27/22  Yes Carlisle Beers, FNP  magnesium oxide (MAG-OX) 400 MG tablet Take 1 tablet by mouth daily.   Yes [provider]  Multiple Vitamin (MULTIVITAMIN WITH MINERALS) TABS tablet Take 1 tablet by mouth daily. 04/10/16  Yes Vassie Loll, MD  omega-3 acid ethyl esters (LOVAZA) 1 g capsule take 2 capsule by oral route 2 times every day 02/28/22  Yes   oxyCODONE-acetaminophen (PERCOCET) 7.5-325 MG tablet take 1 tablet by oral route  every 12 hours as needed 03/30/22  Yes   Rimegepant Sulfate (NURTEC) 75 MG TBDP Take 75 mg by mouth as needed. 04/17/22  Yes Ocie Doyne, MD  amoxicillin-clavulanate (AUGMENTIN) 875-125 MG tablet Take 1 tablet by mouth 2 (two) times daily. 06/28/22   Wallis Bamberg, PA-C  aspirin-acetaminophen-caffeine (EXCEDRIN MIGRAINE) [phone] MG tablet Take 1 tablet by mouth 2 (two) times daily as needed for migraine.     [provider]  benzonatate (TESSALON) 100 MG capsule Take 1 capsule (100 mg total) by mouth 3 (three) times daily as needed. 07/08/22   Margaretann Loveless, PA-C  fluticasone (FLOVENT HFA) 110 MCG/ACT inhaler Inhale 2 puffs into the lungs in the morning and at bedtime. 07/08/22   Margaretann Loveless, PA-C  levofloxacin (LEVAQUIN) 750 MG tablet Take 1 tablet (750 mg total) by mouth daily. 07/08/22   Margaretann Loveless, PA-C  predniSONE (DELTASONE) 10 MG tablet Take as directed on attached paper for 6 days. 07/03/22     promethazine-dextromethorphan (PROMETHAZINE-DM) 6.25-15 MG/5ML syrup Take 5 mLs by mouth 4 (four) times daily as needed. 07/08/22   Margaretann Loveless, PA-C    Family History Family History  Problem Relation Age of Onset   Hypothyroidism Mother    Heart disease Mother    Diabetes Mother    ALS Father     Social History Social History   Tobacco Use   Smoking status: Never   Smokeless tobacco: Never  Substance Use Topics   Alcohol use: Yes    Alcohol/week: 10.0 standard  drinks of alcohol    Types: 10 Shots of liquor per week    Comment: 04/17/22 10 drinks a week   Drug use: No     Allergies   Imitrex [sumatriptan], Latex, and Benadryl [diphenhydramine]   Review of Systems Review of Systems Per HPI  Physical Exam Triage Vital Signs ED Triage Vitals  Enc Vitals Group     BP 07/27/22 1204 (!) 151/98     Pulse Rate 07/27/22 1204 82     Resp 07/27/22 1204 18     Temp 07/27/22 1204 98.4 F (36.9 C)     Temp Source 07/27/22 1204 Oral     SpO2 07/27/22 1204 96 %     Weight --  Height --      Head Circumference --      Peak Flow --      Pain Score 07/27/22 1202 0     Pain Loc --      Pain Edu? --      Excl. in GC? --    No data found.  Updated Vital Signs BP (!) 151/98 (BP Location: Right Arm)   Pulse 82   Temp 98.4 F (36.9 C) (Oral)   Resp 18   SpO2 96%   Visual Acuity Right Eye Distance:   Left Eye Distance:   Bilateral Distance:    Right Eye Near:   Left Eye Near:    Bilateral Near:     Physical Exam Vitals and nursing note reviewed.  Constitutional:      Appearance: He is not ill-appearing or toxic-appearing.  HENT:     Head: Normocephalic and atraumatic.     Jaw: There is normal jaw occlusion.     Right Ear: Hearing, tympanic membrane, ear canal and external ear normal.     Left Ear: Hearing, tympanic membrane, ear canal and external ear normal.     Nose: Congestion and rhinorrhea present. Rhinorrhea is clear.     Right Turbinates: Swollen and pale.     Left Turbinates: Swollen and pale.     Mouth/Throat:     Lips: Pink.     Mouth: Mucous membranes are moist.     Pharynx: Oropharynx is clear. No posterior oropharyngeal erythema.     Comments: Moderate amount of clear postnasal drainage visualized to the posterior oropharynx.  Eyes:     General: Lids are normal. Vision grossly intact. Gaze aligned appropriately.     Extraocular Movements: Extraocular movements intact.     Conjunctiva/sclera: Conjunctivae  normal.  Cardiovascular:     Rate and Rhythm: Normal rate and regular rhythm.     Heart sounds: Normal heart sounds, S1 normal and S2 normal.  Pulmonary:     Effort: Pulmonary effort is normal. No respiratory distress.     Breath sounds: Normal breath sounds and air entry. No decreased breath sounds, wheezing, rhonchi or rales.     Comments: No adventitious lung sounds heard to auscultation.  Air movement is normal and patient is not exhibiting any signs of respiratory distress. Abdominal:     General: Abdomen is flat.  Musculoskeletal:     Cervical back: Neck supple.  Skin:    General: Skin is warm and dry.     Capillary Refill: Capillary refill takes less than 2 seconds.     Findings: No rash.  Neurological:     General: No focal deficit present.     Mental Status: He is alert and oriented to person, place, and time. Mental status is at baseline.     Cranial Nerves: No dysarthria or facial asymmetry.  Psychiatric:        Mood and Affect: Mood normal.        Speech: Speech normal.        Behavior: Behavior normal.        Thought Content: Thought content normal.        Judgment: Judgment normal.      UC Treatments / Results  Labs (all labs ordered are listed, but only abnormal results are displayed) Labs Reviewed - No data to display  EKG   Radiology No results found.  Procedures Procedures (including critical care time)  Medications Ordered in UC Medications - No data to display  Initial Impression / Assessment and Plan / UC Course  I have reviewed the triage vital signs and the nursing notes.  Pertinent labs & imaging results that were available during my care of the patient were reviewed by me and considered in my medical decision making (see chart for details).   1.  Subacute cough and seasonal allergic rhinitis due to pollen No clinical indication for repeat imaging of the chest today based on hemodynamically stable vital signs, clear cardiopulmonary exam  findings, and nontoxic clinical appearance.  Patient has been afebrile and without fatigue or shortness of breath/chest pain.  Discussed these recommendations with patient who expresses understanding and agreement with plan.  Shared decision making used to defer imaging at today's visit.   New symptoms of dry cough, rhinorrhea, and voice hoarseness are likely related to seasonal allergic rhinitis flare.  We will switch patient from cetirizine to Xyzal as he has been taking cetirizine for the last couple years and this seems to have stopped working to help manage his allergic rhinitis symptoms.  He is to take Xyzal once daily and apply 2 sprays of Flonase into each nostril once daily for the next week, then 1 spray daily going forward to further reduce allergic rhinitis symptoms during fall allergy season.  He may use Mucinex over-the-counter as needed for nasal congestion to thin mucus and help with cough.  He may also use Tessalon Perles every 8 hours as needed to help with cough and states that he has plenty of this from home supply.  Advised to follow-up with his primary care provider in the next couple weeks should symptoms fail to improve with interventions described above.   Discussed physical exam and available lab work findings in clinic with patient.  Counseled patient regarding appropriate use of medications and potential side effects for all medications recommended or prescribed today. Discussed red flag signs and symptoms of worsening condition,when to call the PCP office, return to urgent care, and when to seek higher level of care in the emergency department. Patient verbalizes understanding and agreement with plan. All questions answered. Patient discharged in stable condition.    Final Clinical Impressions(s) / UC Diagnoses   Final diagnoses:  Subacute cough  Seasonal allergic rhinitis due to pollen     Discharge Instructions      Change your allergy medication from Zyrtec to Xyzal  allergy and take this once daily.   Apply 2 sprays of Flonase into each nostril once daily for the next week, then only 1 spray into each nostril going forward to help with nasal congestion and swelling.  You may take Mucinex plain over-the-counter as needed to help with nasal congestion and cough.  Add a humidifier to your room to help with cough.   Please follow-up with your primary care provider in the next couple of weeks if symptoms persist.   Return to urgent care if needed.     ED Prescriptions     Medication Sig Dispense Auth. Provider   levocetirizine (XYZAL ALLERGY 24HR) 5 MG tablet Take 1 tablet (5 mg total) by mouth every evening. 30 tablet Joella Prince M, FNP   fluticasone [facility]) 50 MCG/ACT nasal spray Place 2 sprays into both nostrils daily. 16 g Talbot Grumbling, FNP      PDMP not reviewed this encounter.   Talbot Grumbling, [place] 07/29/22 1143

## 2022-07-27 NOTE — Discharge Instructions (Signed)
Change your allergy medication from Zyrtec to Xyzal allergy and take this once daily.   Apply 2 sprays of Flonase into each nostril once daily for the next week, then only 1 spray into each nostril going forward to help with nasal congestion and swelling.  You may take Mucinex plain over-the-counter as needed to help with nasal congestion and cough.  Add a humidifier to your room to help with cough.   Please follow-up with your primary care provider in the next couple of weeks if symptoms persist.   Return to urgent care if needed.

## 2022-08-14 ENCOUNTER — Other Ambulatory Visit (HOSPITAL_BASED_OUTPATIENT_CLINIC_OR_DEPARTMENT_OTHER): Payer: Self-pay

## 2022-08-21 ENCOUNTER — Other Ambulatory Visit (HOSPITAL_BASED_OUTPATIENT_CLINIC_OR_DEPARTMENT_OTHER): Payer: Self-pay

## 2022-08-21 DIAGNOSIS — A630 Anogenital (venereal) warts: Secondary | ICD-10-CM | POA: Diagnosis not present

## 2022-08-21 DIAGNOSIS — G8929 Other chronic pain: Secondary | ICD-10-CM | POA: Diagnosis not present

## 2022-08-21 DIAGNOSIS — R109 Unspecified abdominal pain: Secondary | ICD-10-CM | POA: Diagnosis not present

## 2022-08-21 DIAGNOSIS — R739 Hyperglycemia, unspecified: Secondary | ICD-10-CM | POA: Diagnosis not present

## 2022-08-21 DIAGNOSIS — F900 Attention-deficit hyperactivity disorder, predominantly inattentive type: Secondary | ICD-10-CM | POA: Diagnosis not present

## 2022-08-21 DIAGNOSIS — Z6828 Body mass index (BMI) 28.0-28.9, adult: Secondary | ICD-10-CM | POA: Diagnosis not present

## 2022-08-21 DIAGNOSIS — M25551 Pain in right hip: Secondary | ICD-10-CM | POA: Diagnosis not present

## 2022-08-21 DIAGNOSIS — E781 Pure hyperglyceridemia: Secondary | ICD-10-CM | POA: Diagnosis not present

## 2022-08-21 DIAGNOSIS — F419 Anxiety disorder, unspecified: Secondary | ICD-10-CM | POA: Diagnosis not present

## 2022-08-21 DIAGNOSIS — Z79899 Other long term (current) drug therapy: Secondary | ICD-10-CM | POA: Diagnosis not present

## 2022-08-21 DIAGNOSIS — F4321 Adjustment disorder with depressed mood: Secondary | ICD-10-CM | POA: Diagnosis not present

## 2022-08-21 DIAGNOSIS — Z125 Encounter for screening for malignant neoplasm of prostate: Secondary | ICD-10-CM | POA: Diagnosis not present

## 2022-08-21 MED ORDER — OXYCODONE-ACETAMINOPHEN 7.5-325 MG PO TABS
1.0000 | ORAL_TABLET | Freq: Two times a day (BID) | ORAL | 0 refills | Status: DC
  Filled 2022-08-21: qty 60, 30d supply, fill #0

## 2022-08-21 MED ORDER — AMPHETAMINE-DEXTROAMPHETAMINE 20 MG PO TABS
20.0000 mg | ORAL_TABLET | Freq: Three times a day (TID) | ORAL | 0 refills | Status: DC
  Filled 2022-08-21: qty 90, 30d supply, fill #0

## 2022-08-21 MED ORDER — ALPRAZOLAM 0.5 MG PO TABS
0.5000 mg | ORAL_TABLET | Freq: Two times a day (BID) | ORAL | 2 refills | Status: DC | PRN
  Filled 2022-08-21: qty 40, 20d supply, fill #0
  Filled 2022-09-29: qty 40, 20d supply, fill #1
  Filled 2022-11-14: qty 40, 20d supply, fill #2

## 2022-08-21 MED ORDER — IMIQUIMOD 5 % EX CREA
1.0000 | TOPICAL_CREAM | CUTANEOUS | 2 refills | Status: DC
  Filled 2022-08-21: qty 24, 34d supply, fill #0
  Filled 2022-11-14: qty 24, 34d supply, fill #1
  Filled 2023-04-22: qty 24, 34d supply, fill #2

## 2022-08-21 MED ORDER — BETAMETHASONE DIPROPIONATE 0.05 % EX OINT
1.0000 | TOPICAL_OINTMENT | Freq: Two times a day (BID) | CUTANEOUS | 0 refills | Status: DC
  Filled 2022-08-21 – 2022-08-22 (×2): qty 30, 30d supply, fill #0

## 2022-08-22 ENCOUNTER — Other Ambulatory Visit (HOSPITAL_BASED_OUTPATIENT_CLINIC_OR_DEPARTMENT_OTHER): Payer: Self-pay

## 2022-08-23 ENCOUNTER — Other Ambulatory Visit (HOSPITAL_BASED_OUTPATIENT_CLINIC_OR_DEPARTMENT_OTHER): Payer: Self-pay

## 2022-08-23 DIAGNOSIS — R739 Hyperglycemia, unspecified: Secondary | ICD-10-CM | POA: Diagnosis not present

## 2022-08-23 DIAGNOSIS — Z79899 Other long term (current) drug therapy: Secondary | ICD-10-CM | POA: Diagnosis not present

## 2022-08-23 DIAGNOSIS — E781 Pure hyperglyceridemia: Secondary | ICD-10-CM | POA: Diagnosis not present

## 2022-08-23 DIAGNOSIS — E039 Hypothyroidism, unspecified: Secondary | ICD-10-CM | POA: Diagnosis not present

## 2022-08-23 DIAGNOSIS — F4321 Adjustment disorder with depressed mood: Secondary | ICD-10-CM | POA: Diagnosis not present

## 2022-08-23 DIAGNOSIS — I10 Essential (primary) hypertension: Secondary | ICD-10-CM | POA: Diagnosis not present

## 2022-08-23 DIAGNOSIS — K861 Other chronic pancreatitis: Secondary | ICD-10-CM | POA: Diagnosis not present

## 2022-08-23 DIAGNOSIS — K760 Fatty (change of) liver, not elsewhere classified: Secondary | ICD-10-CM | POA: Diagnosis not present

## 2022-08-24 ENCOUNTER — Other Ambulatory Visit (HOSPITAL_BASED_OUTPATIENT_CLINIC_OR_DEPARTMENT_OTHER): Payer: Self-pay

## 2022-08-24 DIAGNOSIS — J020 Streptococcal pharyngitis: Secondary | ICD-10-CM | POA: Diagnosis not present

## 2022-08-28 ENCOUNTER — Other Ambulatory Visit (HOSPITAL_BASED_OUTPATIENT_CLINIC_OR_DEPARTMENT_OTHER): Payer: Self-pay

## 2022-08-30 ENCOUNTER — Other Ambulatory Visit (HOSPITAL_BASED_OUTPATIENT_CLINIC_OR_DEPARTMENT_OTHER): Payer: Self-pay

## 2022-08-31 ENCOUNTER — Other Ambulatory Visit (HOSPITAL_BASED_OUTPATIENT_CLINIC_OR_DEPARTMENT_OTHER): Payer: Self-pay

## 2022-08-31 ENCOUNTER — Other Ambulatory Visit (HOSPITAL_BASED_OUTPATIENT_CLINIC_OR_DEPARTMENT_OTHER): Payer: Self-pay | Admitting: Internal Medicine

## 2022-08-31 DIAGNOSIS — M25551 Pain in right hip: Secondary | ICD-10-CM

## 2022-09-01 ENCOUNTER — Other Ambulatory Visit (HOSPITAL_BASED_OUTPATIENT_CLINIC_OR_DEPARTMENT_OTHER): Payer: Self-pay

## 2022-09-01 DIAGNOSIS — R0981 Nasal congestion: Secondary | ICD-10-CM | POA: Diagnosis not present

## 2022-09-01 DIAGNOSIS — F1290 Cannabis use, unspecified, uncomplicated: Secondary | ICD-10-CM | POA: Diagnosis not present

## 2022-09-01 DIAGNOSIS — G4733 Obstructive sleep apnea (adult) (pediatric): Secondary | ICD-10-CM | POA: Diagnosis not present

## 2022-09-01 DIAGNOSIS — R0683 Snoring: Secondary | ICD-10-CM | POA: Diagnosis not present

## 2022-09-01 DIAGNOSIS — G479 Sleep disorder, unspecified: Secondary | ICD-10-CM | POA: Diagnosis not present

## 2022-09-01 DIAGNOSIS — G8929 Other chronic pain: Secondary | ICD-10-CM | POA: Diagnosis not present

## 2022-09-07 ENCOUNTER — Ambulatory Visit (HOSPITAL_BASED_OUTPATIENT_CLINIC_OR_DEPARTMENT_OTHER)
Admission: RE | Admit: 2022-09-07 | Discharge: 2022-09-07 | Disposition: A | Discharge status: Home / Self Care | Payer: 59 | Source: Ambulatory Visit | Attending: Internal Medicine | Admitting: Internal Medicine

## 2022-09-07 DIAGNOSIS — M25551 Pain in right hip: Secondary | ICD-10-CM | POA: Insufficient documentation

## 2022-09-07 DIAGNOSIS — M87051 Idiopathic aseptic necrosis of right femur: Secondary | ICD-10-CM | POA: Diagnosis not present

## 2022-09-07 MED ORDER — GADOBUTROL 1 MMOL/ML IV SOLN
9.7000 mL | Freq: Once | INTRAVENOUS | Status: AC | PRN
  Administered 2022-09-07: 9.7 mL via INTRAVENOUS
  Filled 2022-09-07: qty 10

## 2022-09-19 ENCOUNTER — Other Ambulatory Visit (HOSPITAL_BASED_OUTPATIENT_CLINIC_OR_DEPARTMENT_OTHER): Payer: Self-pay

## 2022-09-19 DIAGNOSIS — R739 Hyperglycemia, unspecified: Secondary | ICD-10-CM | POA: Diagnosis not present

## 2022-09-19 DIAGNOSIS — J019 Acute sinusitis, unspecified: Secondary | ICD-10-CM | POA: Diagnosis not present

## 2022-09-19 DIAGNOSIS — M879 Osteonecrosis, unspecified: Secondary | ICD-10-CM | POA: Diagnosis not present

## 2022-09-19 DIAGNOSIS — G4733 Obstructive sleep apnea (adult) (pediatric): Secondary | ICD-10-CM | POA: Diagnosis not present

## 2022-09-19 DIAGNOSIS — R0981 Nasal congestion: Secondary | ICD-10-CM | POA: Diagnosis not present

## 2022-09-19 MED ORDER — AMOXICILLIN-POT CLAVULANATE 875-125 MG PO TABS
ORAL_TABLET | ORAL | 0 refills | Status: DC
  Filled 2022-09-19: qty 20, 10d supply, fill #0

## 2022-09-28 ENCOUNTER — Other Ambulatory Visit: Payer: Self-pay | Admitting: *Deleted

## 2022-09-28 DIAGNOSIS — I8393 Asymptomatic varicose veins of bilateral lower extremities: Secondary | ICD-10-CM

## 2022-09-29 ENCOUNTER — Other Ambulatory Visit: Payer: Self-pay

## 2022-09-29 ENCOUNTER — Other Ambulatory Visit (HOSPITAL_BASED_OUTPATIENT_CLINIC_OR_DEPARTMENT_OTHER): Payer: Self-pay

## 2022-10-01 ENCOUNTER — Other Ambulatory Visit (HOSPITAL_BASED_OUTPATIENT_CLINIC_OR_DEPARTMENT_OTHER): Payer: Self-pay

## 2022-10-02 ENCOUNTER — Encounter (HOSPITAL_BASED_OUTPATIENT_CLINIC_OR_DEPARTMENT_OTHER): Payer: Self-pay

## 2022-10-02 ENCOUNTER — Other Ambulatory Visit (HOSPITAL_BASED_OUTPATIENT_CLINIC_OR_DEPARTMENT_OTHER): Payer: Self-pay

## 2022-10-02 ENCOUNTER — Other Ambulatory Visit: Payer: Self-pay

## 2022-10-02 DIAGNOSIS — R0683 Snoring: Secondary | ICD-10-CM

## 2022-10-02 DIAGNOSIS — G4700 Insomnia, unspecified: Secondary | ICD-10-CM

## 2022-10-02 MED ORDER — AMPHETAMINE-DEXTROAMPHETAMINE 20 MG PO TABS
20.0000 mg | ORAL_TABLET | Freq: Three times a day (TID) | ORAL | 0 refills | Status: DC
  Filled 2022-10-02: qty 90, 30d supply, fill #0

## 2022-10-02 MED ORDER — OXYCODONE-ACETAMINOPHEN 7.5-325 MG PO TABS
1.0000 | ORAL_TABLET | Freq: Two times a day (BID) | ORAL | 0 refills | Status: DC | PRN
  Filled 2022-10-02: qty 40, 20d supply, fill #0
  Filled 2022-10-03: qty 20, 10d supply, fill #0

## 2022-10-03 ENCOUNTER — Encounter (HOSPITAL_BASED_OUTPATIENT_CLINIC_OR_DEPARTMENT_OTHER): Payer: Self-pay | Admitting: Pharmacist

## 2022-10-03 ENCOUNTER — Other Ambulatory Visit (HOSPITAL_BASED_OUTPATIENT_CLINIC_OR_DEPARTMENT_OTHER): Payer: Self-pay

## 2022-10-04 ENCOUNTER — Other Ambulatory Visit (HOSPITAL_BASED_OUTPATIENT_CLINIC_OR_DEPARTMENT_OTHER): Payer: Self-pay

## 2022-10-06 ENCOUNTER — Ambulatory Visit (INDEPENDENT_AMBULATORY_CARE_PROVIDER_SITE_OTHER): Payer: 59 | Admitting: Surgery

## 2022-10-06 ENCOUNTER — Encounter: Payer: Self-pay | Admitting: Surgery

## 2022-10-06 ENCOUNTER — Ambulatory Visit (HOSPITAL_COMMUNITY)
Admission: RE | Admit: 2022-10-06 | Discharge: 2022-10-06 | Disposition: A | Discharge status: Home / Self Care | Payer: 59 | Source: Ambulatory Visit | Attending: Surgery | Admitting: Surgery

## 2022-10-06 VITALS — BP 141/88 | HR 87 | Temp 98.7°F | Resp 20 | Ht 74.0 in | Wt 216.9 lb

## 2022-10-06 DIAGNOSIS — I872 Venous insufficiency (chronic) (peripheral): Secondary | ICD-10-CM | POA: Diagnosis not present

## 2022-10-06 DIAGNOSIS — I8393 Asymptomatic varicose veins of bilateral lower extremities: Secondary | ICD-10-CM | POA: Diagnosis not present

## 2022-10-06 NOTE — Progress Notes (Signed)
[facility] [place]  Patient name: Connor Martin MRN: [id number] DOB: 08/03/1976 Sex: male   REQUESTING PROVIDER:    Dr. Marlou Sa   REASON FOR CONSULT:    Varicose veins  HISTORY OF PRESENT ILLNESS:   Connor Martin is a 47 y.o. male, who is referred for evaluation of venous insufficiency in the left leg.  He states that for several years he has had some discoloration and bulging around the left ankle.  He denies any swelling.  He does not have any pain or fatigue in the leg.  He has never had a DVT although he was worked up for one several years ago at [employer].  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  PAST MEDICAL HISTORY    Past Medical History:  Diagnosis Date   Acute pancreatitis hospitalized 04/06/2016   Acute sinusitis    ADD (attention deficit disorder)    Anxiety    Blood in stool    Bronchitis    Chronic lower back pain    Depression    Dyslipidemia    Family history of adverse reaction to anesthesia    "mom PONV" (04/07/2016)   Family history of colon cancer    Fatty liver    GERD (gastroesophageal reflux disease)    Headache    "@ least once/week" (04/07/2016)   Hypertriglyceridemia    Hypertriglyceridemia    Hypothyroidism (acquired)    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    Migraine    "q other month" (04/07/2016)   Nasal congestion    Palpitations    Pancreatitis    Panic attacks    Pneumonia ~ 1981   Sleep apnea    Snoring      FAMILY HISTORY   Family History  Problem Relation Age of Onset   Hypothyroidism Mother    Heart disease Mother    Diabetes Mother    ALS Father     SOCIAL HISTORY:   Social History   Socioeconomic History   Marital status: Married    Spouse name: Miranda   Number of children: 1   Years of education: Not on file   Highest education level: Bachelor's degree (e.g., BA, AB, BS)  Occupational  History    Comment: Music therapist  Tobacco Use   Smoking status: Never   Smokeless tobacco: Never  Substance and Sexual Activity   Alcohol use: Yes    Alcohol/week: 10.0 standard drinks of alcohol    Types: 10 Shots of liquor per week    Comment: 04/17/22 10 drinks a week   Drug use: No   Sexual activity: Yes  Other Topics Concern   Not on file  Social History Narrative   Lives with wife   Caffeine- very little   Social Determinants of Health   Financial Resource Strain: Not on file  Food Insecurity: Not on file  Transportation Needs: Not on file  Physical Activity: Not on file  Stress: Not on file  Social Connections: Not on file  Intimate Partner Violence: Not on file    ALLERGIES:    Allergies  Allergen Reactions   Imitrex [Sumatriptan] Swelling    Brain   Latex Other (See Comments)    Does not prefer   Benadryl [Diphenhydramine] Hives and Palpitations    Unsure, SOB    CURRENT MEDICATIONS:    Current Outpatient Medications  Medication Sig Dispense Refill   albuterol (VENTOLIN HFA)  108 (90 Base) MCG/ACT inhaler Inhale 2 puffs into the lungs every 4 (four) hours as needed for difficulty breath. 6.7 g 0   ALPRAZolam (XANAX) 0.5 MG tablet Take 1 tablet by mouth 2 times every day as needed. 40 tablet 2   amphetamine-dextroamphetamine (ADDERALL) 20 MG tablet Take 1 tablet (20 mg total) by mouth 3 (three) times daily. 90 tablet 0   betamethasone dipropionate (DIPROLENE) 0.05 % ointment Apply topically 2 times every day, use a thin layer to the affected area(s). 30 g 0   esomeprazole (NEXIUM) 40 MG capsule take 1 capsule by oral route  every day 90 capsule 3   fenofibrate 160 MG tablet TAKE 1 TABLET BY MOUTH EVERY DAY 90 tablet 3   fluticasone (FLONASE) 50 MCG/ACT nasal spray Place 2 sprays into both nostrils daily. 16 g 2   fluticasone (FLOVENT HFA) 110 MCG/ACT inhaler Inhale 2 puffs into the lungs in the morning and at bedtime. 1 each 12   imiquimod (ALDARA) 5 %  cream Apply topically 5 nights per week, use occlusive dressing, wash off after 8-10 hours. 24 each 2   levocetirizine (XYZAL ALLERGY 24HR) 5 MG tablet Take 1 tablet (5 mg total) by mouth every evening. 30 tablet 1   levofloxacin (LEVAQUIN) 750 MG tablet Take 1 tablet (750 mg total) by mouth daily. 7 tablet 0   magnesium oxide (MAG-OX) 400 MG tablet Take 1 tablet by mouth daily.     Multiple Vitamin (MULTIVITAMIN WITH MINERALS) TABS tablet Take 1 tablet by mouth daily.     omega-3 acid ethyl esters (LOVAZA) 1 g capsule take 2 capsule by oral route 2 times every day 360 capsule 1   oxyCODONE-acetaminophen (PERCOCET) 7.5-325 MG tablet Take 1 tablet by mouth every 12 (twelve) hours as needed. 60 tablet 0   Rimegepant Sulfate (NURTEC) 75 MG TBDP Take 75 mg by mouth as needed. 8 tablet 6   No current facility-administered medications for this visit.    REVIEW OF SYSTEMS:   [X]  denotes positive finding, [ ]  denotes negative finding Cardiac  Comments:  Chest pain or chest pressure:    Shortness of breath upon exertion:    Short of breath when lying flat:    Irregular heart rhythm:        Vascular    Pain in calf, thigh, or hip brought on by ambulation:    Pain in feet at night that wakes you up from your sleep:     Blood clot in your veins:    Leg swelling:         Pulmonary    Oxygen at home:    Productive cough:     Wheezing:         Neurologic    Sudden weakness in arms or legs:     Sudden numbness in arms or legs:     Sudden onset of difficulty speaking or slurred speech:    Temporary loss of vision in one eye:     Problems with dizziness:         Gastrointestinal    Blood in stool:      Vomited blood:         Genitourinary    Burning when urinating:     Blood in urine:        Psychiatric    Major depression:         Hematologic    Bleeding problems:    Problems with blood clotting too easily:  Skin    Rashes or ulcers:        Constitutional    Fever or  chills:     PHYSICAL EXAM:   Vitals:   10/06/22 1403  BP: (!) 141/88  Pulse: 87  Resp: 20  Temp: 98.7 F (37.1 C)  SpO2: 97%  Weight: 216 lb 14.4 oz (98.4 kg)  Height: 6\' 2"  (1.88 m)    GENERAL: The patient is a well-nourished male, in no acute distress. The vital signs are documented above. CARDIAC: There is a regular rate and rhythm.  VASCULAR: Palpable pedal pulses.  Area of telangiectasias around the left medial ankle PULMONARY: Nonlabored respirations MUSCULOSKELETAL: There are no major deformities or cyanosis. NEUROLOGIC: No focal weakness or paresthesias are detected. SKIN: See photo below PSYCHIATRIC: The patient has a normal affect.  STUDIES:   I have reviewed the following:  -------------+---------+------+-----------+------------+-------------+  LEFT         Reflux NoRefluxReflux TimeDiameter cmsComments                               Yes                                        +--------------+---------+------+-----------+------------+-------------+  CFV                    yes   >1 second                            +--------------+---------+------+-----------+------------+-------------+  FV prox                 yes   >1 second                            +--------------+---------+------+-----------+------------+-------------+  FV mid        no                                                   +--------------+---------+------+-----------+------------+-------------+  FV dist       no                                                   +--------------+---------+------+-----------+------------+-------------+  Popliteal    no                                                   +--------------+---------+------+-----------+------------+-------------+  GSV at SFJ              yes    >500 ms     0.804                   +--------------+---------+------+-----------+------------+-------------+  GSV prox thigh          yes     >500 ms     0.493                   +--------------+---------+------+-----------+------------+-------------+  GSV mid thigh           yes    >500 ms     0.859    out of fascia  +--------------+---------+------+-----------+------------+-------------+  GSV dist thigh          yes    >500 ms     0.696                   +--------------+---------+------+-----------+------------+-------------+  GSV at knee             yes    >500 ms     0.621                   +--------------+---------+------+-----------+------------+-------------+  GSV prox calf           yes    >500 ms     0.722    branching      +--------------+---------+------+-----------+------------+-------------+  GSV mid calf            yes    >500 ms     0.589    branching      +--------------+---------+------+-----------+------------+-------------+  GSV dist calf           yes    >500 ms     0.382    branching      +--------------+---------+------+-----------+------------+-------------+  SSV Pop Fossa no                           0.319                   +--------------+---------+------+-----------+------------+-------------+  SSV prox calf no                           0.214                   +--------------+---------+------+-----------+------------+-------------+  SSV mid calf  no                           0.215                   +--------------+---------+------+-----------+------------+-------------+   ASSESSMENT and PLAN   Venous insufficiency of the left leg: The patient is essentially asymptomatic with regards to his reflux.  He does not have early fatigue, heaviness or leg swelling in the left leg.  His biggest concern is discoloration of his left medial ankle likely secondary to multiple telangiectasias.  At this point, I do not think his axial reflux is severe enough to consider laser ablation.  Since his biggest concern is the appearance of his ankle, I will have  him evaluated for possible sclerotherapy.   Leia Alf, MD, FACS [facility] [phone] Pager [phone]

## 2022-10-11 ENCOUNTER — Ambulatory Visit (INDEPENDENT_AMBULATORY_CARE_PROVIDER_SITE_OTHER): Payer: 59 | Admitting: Orthopaedic Surgery

## 2022-10-11 ENCOUNTER — Encounter: Payer: Self-pay | Admitting: Orthopaedic Surgery

## 2022-10-11 ENCOUNTER — Ambulatory Visit: Payer: Self-pay

## 2022-10-11 ENCOUNTER — Ambulatory Visit (INDEPENDENT_AMBULATORY_CARE_PROVIDER_SITE_OTHER): Payer: 59

## 2022-10-11 DIAGNOSIS — M25551 Pain in right hip: Secondary | ICD-10-CM

## 2022-10-11 DIAGNOSIS — M87052 Idiopathic aseptic necrosis of left femur: Secondary | ICD-10-CM | POA: Diagnosis not present

## 2022-10-11 DIAGNOSIS — M87051 Idiopathic aseptic necrosis of right femur: Secondary | ICD-10-CM | POA: Diagnosis not present

## 2022-10-11 DIAGNOSIS — M25552 Pain in left hip: Secondary | ICD-10-CM | POA: Diagnosis not present

## 2022-10-11 NOTE — Progress Notes (Signed)
Office Visit Note   Patient: Connor Martin           Date of Birth: Jan 30, 1976           MRN: [id number] Visit Date: 10/11/2022              Requested by: Sharene Skeans, MD [place],  [place] [address] PCP: Sharene Skeans, MD   Assessment & Plan: Visit Diagnoses:  1. Pain of left hip   2. Pain of right hip   3. Avascular necrosis of bone of right hip ([place])   4. Avascular necrosis of bone of left hip ([place])     Plan: I spoke in detail in length with the patient and his wife about hip replacement surgery.  This is the main recommendation and treatment given the degree of AVN he has in both hips.  I went over his plain film findings and MRI findings and showed the imaging.  We went over hip replacement model and I talked in length in detail about the risks and benefits of the surgery and what to expect from an intraoperative and postoperative course.  We do need to get him scheduled for right hip replacement sometime in the near future.  This does not need to be emergent as of now since there is no subchondral fracture.  However, if he develops significant worsening pain he needs to let us know.  As of now, he will avoid any high impact aerobic activities as it relates to his right hip.  All questions and concerns were addressed and answered.  We will work on getting him scheduled for a right total hip arthroplasty.   Follow-Up Instructions: Return for 2 weeks post-op.   Orders:  Orders Placed This Encounter  Procedures   XR Pelvis 1-2 Views   No orders of the defined types were placed in this encounter.     Procedures: No procedures performed   Clinical Data: No additional findings.   Subjective: Chief Complaint  Patient presents with   Left Hip - Pain   Right Hip - Pain  The patient is a very active and pleasant 47 year old gentleman who is referred to me due to a known diagnosis of bilateral hip avascular necrosis with the right worse than the left.  He  does report right hip and groin pain with weightbearing and other activities such as pivoting on that right hip.  This has been getting worse for the last 8 months.  He said the pain comes and goes.  When he had continued right hip pain he was appropriately sent for x-rays of his right hip and then a MRI and the MRIs on our system for me to review.  I did get a standing AP pelvis today.  His wife is with him.  She works within the [employer] and has extensive medical knowledge.  We did talk about the etiology of avascular necrosis.  He is may be from alcohol use over time.  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  He tries to avoid high impact aerobic activities.  He is not obese.  He is not a diabetic.  At this point his right hip pain is beginning to definitely affect his mobility, his quality of life and his actives daily living.  He has a significant medical questions today which were all entirely appropriate given his situation and diagnosis.  HPI  Review of Systems There is currently listed  no fever, chills, nausea, vomiting.  Objective: Vital Signs: There were no vitals taken for this visit.  Physical Exam He is alert and oriented x 3 and in no acute distress and very pleasant to interact with Ortho Exam Examination of both hips shows that moves smoothly and fluidly with no blocks or rotation but there is definitely pain more so on the right side in the groin with internal and external rotation only slight on the left side. Specialty Comments:  No specialty comments available.  Imaging: XR Pelvis 1-2 Views  Result Date: 10/11/2022 I standing AP pelvis shows bilateral hip joints that are well-maintained in terms of the joint space.  There is no evidence of subchondral fracture or femoral head collapse in light of the patient's known diagnosis of bilateral hip avascular necrosis.  There are subtle changes that can be seen such as cystic changes in the subchondral area of  both hips.   The MRI of his pelvis and right hip was reviewed with him that also shows the left hip.  There is extensive avascular necrosis of both hips which is worse on the right than the left.  There is no subchondral fracture collapse as of yet but the findings are quite extensive in terms of the subchondral edema and cystic changes.  There is more effusion on the right hip than the left hip.   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   Adjustment disorder with depressed mood 02/25/2020   Nutritional anemia 02/25/2020   Fatty liver 09/02/2016   History of panic attacks 08/30/2016   GAD (generalized anxiety disorder) 08/30/2016   Gastroesophageal reflux disease without esophagitis    High blood triglycerides    Pancreatitis 04/07/2016   Abnormal LFTs 04/06/2016   Alcohol use 04/06/2016   Cough 04/06/2016   Pancreatitis, acute 04/06/2016   Panic attacks    GERD (gastroesophageal reflux disease)    Past Medical History:  Diagnosis Date   Acute pancreatitis hospitalized 04/06/2016   Acute sinusitis    ADD (attention deficit disorder)    Anxiety    Blood in stool    Bronchitis    Chronic lower back pain    Depression    Dyslipidemia    Family history of adverse reaction to anesthesia    "mom PONV" (04/07/2016)   Family history of colon cancer    Fatty liver    GERD (gastroesophageal reflux disease)    Headache    "@ least once/week" (04/07/2016)   Hypertriglyceridemia    Hypertriglyceridemia    Hypothyroidism (acquired)    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    Migraine    "q other month" (04/07/2016)   Nasal congestion    Palpitations    Pancreatitis    Panic attacks    Pneumonia ~ 1981   Sleep apnea    Snoring     Family History  Problem Relation Age of Onset   Hypothyroidism Mother    Heart disease  Mother    Diabetes Mother    ALS Father     Past Surgical History:  Procedure Laterality Date   TONSILLECTOMY AND ADENOIDECTOMY  ~ 1990   Social History   Occupational History    Comment: Merchandiser, retail  Tobacco Use   Smoking status: Never   Smokeless tobacco: Never  Substance and Sexual Activity  Alcohol use: Yes    Alcohol/week: 10.0 standard drinks of alcohol    Types: 10 Shots of liquor per week    Comment: 04/17/22 10 drinks a week   Drug use: No   Sexual activity: Yes

## 2022-10-17 ENCOUNTER — Ambulatory Visit (HOSPITAL_BASED_OUTPATIENT_CLINIC_OR_DEPARTMENT_OTHER): Payer: 59 | Attending: Internal Medicine | Admitting: Internal Medicine

## 2022-10-17 DIAGNOSIS — G4736 Sleep related hypoventilation in conditions classified elsewhere: Secondary | ICD-10-CM | POA: Insufficient documentation

## 2022-10-17 DIAGNOSIS — G4733 Obstructive sleep apnea (adult) (pediatric): Secondary | ICD-10-CM | POA: Insufficient documentation

## 2022-10-17 DIAGNOSIS — R0683 Snoring: Secondary | ICD-10-CM | POA: Diagnosis present

## 2022-10-17 DIAGNOSIS — G4700 Insomnia, unspecified: Secondary | ICD-10-CM

## 2022-10-17 NOTE — Progress Notes (Deleted)
Referring:  Sharene Skeans, MD [place],  [place] [address]  PCP: Sharene Skeans, MD  Neurology was asked to evaluate Connor Martin, a 47 year old male for a chief complaint of headaches.  Our recommendations of care will be communicated by shared medical record.    CC:  headaches  History provided from self  Follow-up visit:  Prior visit: 04/17/2022 with Dr. Billey Gosling (initial consult visit)   Brief HPI:   Connor Martin is a 47 y.o. male he is being followed for migraine headaches which worsened around 01/2022.  Episode on 04/07/2022 with severe headache associated with right facial and foot numbness which lasted for about 30 minutes.   At prior visit, was started on Nurtec as needed for rescue.  Completed MRI brain and CTA head/neck which were unremarkable.  TTE not yet completed.   Interval history:     Medical co-morbidities: GERD, GAD, hypertriglyceridemia c/b pancreatitis  The patient presents for evaluation of headaches and numbness. He started having migraines in college. Initially would have them once ever 1-2 months, but they have increased in frequency over the past 2 months. Currently averages one migraine per week.  On 04/07/22 he developed a severe headache. Took Excedrin migraine which helped a little but did not resolve his headache. Felt very nauseated and was not thinking clearly. Had to have his wife give him directions while driving. Got out of the drivers seat and lied down in the backseat. Got out of the car and felt off-balance. Then the right side of his face started tingling and went numb. Right foot also went numb. This lasted for ~30 minutes. Wife told him his speech was incoherent. Also felt lightheaded and his heart was pounding. Presented to the ED where [facility] was unremarkable. BP was elevated (166/100). Headache lasted for 24 hours until it resolved.  Notes he recently fell down the stairs 2 weeks prior to the headache, thinks he hit his head  at the time. Did not seek treatment.  Recently had an episode of very blurred vision in his left eye which lasted for 2 days. This was not associated with a headache.  Headache History: Onset: college, increased in frequency over the past 2 months Triggers: fasting Aura: numbness in face/foot Associated Symptoms:  Photophobia: yes  Phonophobia: yes  Nausea: yes Vomiting: no Worse with activity?: yes Duration of headaches: can last all day  Headache days per month: 4 Headache free days per month: 26  Current Treatment: Abortive Excedrin  Preventative none  Prior Therapies                                 Excedrin Imitrex - swelling  LABS: CBC    Component Value Date/Time   WBC 9.2 04/07/2022 1353   RBC 4.24 04/07/2022 1353   HGB 14.3 04/07/2022 1353   HCT 40.1 04/07/2022 1353   PLT 235 04/07/2022 1353   MCV 94.6 04/07/2022 1353   MCH 33.7 04/07/2022 1353   MCHC 35.7 04/07/2022 1353   RDW 12.6 04/07/2022 1353   LYMPHSABS 1.3 04/07/2022 1353   MONOABS 0.5 04/07/2022 1353   EOSABS 0.1 04/07/2022 1353   BASOSABS 0.0 04/07/2022 1353      Latest Ref Rng & Units 04/07/2022    1:53 PM 09/28/2021    3:07 PM 04/10/2016    5:00 AM  CMP  Glucose 70 - 99 mg/dL 107   123  BUN 6 - 20 mg/dL 13   <5   Creatinine 0.61 - 1.24 mg/dL 0.92   0.94   Sodium 135 - 145 mmol/L 137   137   Potassium 3.5 - 5.1 mmol/L 3.4   3.8   Chloride 98 - 111 mmol/L 100   108   CO2 22 - 32 mmol/L 22   23   Calcium 8.9 - 10.3 mg/dL 9.2   7.7   Total Protein 6.5 - 8.1 g/dL 7.0  7.6  5.2   Total Bilirubin 0.3 - 1.2 mg/dL 0.6  0.6  2.2   Alkaline Phos 38 - 126 U/L 137  182  90   AST 15 - 41 U/L 48  55  74   ALT 0 - 44 U/L 33  43  46      IMAGING:  CTH 04/07/22: unremarkable  Imaging independently reviewed on October 17, 2022   Current Outpatient Medications on File Prior to Visit  Medication Sig Dispense Refill   albuterol (VENTOLIN HFA) 108 (90 Base) MCG/ACT inhaler Inhale 2 puffs into the  lungs every 4 (four) hours as needed for difficulty breath. 6.7 g 0   ALPRAZolam (XANAX) 0.5 MG tablet Take 1 tablet by mouth 2 times every day as needed. 40 tablet 2   amphetamine-dextroamphetamine (ADDERALL) 20 MG tablet Take 1 tablet (20 mg total) by mouth 3 (three) times daily. 90 tablet 0   betamethasone dipropionate (DIPROLENE) 0.05 % ointment Apply topically 2 times every day, use a thin layer to the affected area(s). 30 g 0   esomeprazole (NEXIUM) 40 MG capsule take 1 capsule by oral route  every day 90 capsule 3   fenofibrate 160 MG tablet TAKE 1 TABLET BY MOUTH EVERY DAY 90 tablet 3   fluticasone (FLONASE) 50 MCG/ACT nasal spray Place 2 sprays into both nostrils daily. 16 g 2   fluticasone (FLOVENT HFA) 110 MCG/ACT inhaler Inhale 2 puffs into the lungs in the morning and at bedtime. 1 each 12   imiquimod (ALDARA) 5 % cream Apply topically 5 nights per week, use occlusive dressing, wash off after 8-10 hours. 24 each 2   levocetirizine (XYZAL ALLERGY 24HR) 5 MG tablet Take 1 tablet (5 mg total) by mouth every evening. 30 tablet 1   levofloxacin (LEVAQUIN) 750 MG tablet Take 1 tablet (750 mg total) by mouth daily. 7 tablet 0   magnesium oxide (MAG-OX) 400 MG tablet Take 1 tablet by mouth daily.     Multiple Vitamin (MULTIVITAMIN WITH MINERALS) TABS tablet Take 1 tablet by mouth daily.     omega-3 acid ethyl esters (LOVAZA) 1 g capsule take 2 capsule by oral route 2 times every day 360 capsule 1   oxyCODONE-acetaminophen (PERCOCET) 7.5-325 MG tablet Take 1 tablet by mouth every 12 (twelve) hours as needed. 60 tablet 0   Rimegepant Sulfate (NURTEC) 75 MG TBDP Take 75 mg by mouth as needed. 8 tablet 6   No current facility-administered medications on file prior to visit.     Allergies: Allergies  Allergen Reactions   Imitrex [Sumatriptan] Swelling    Brain   Latex Other (See Comments)    Does not prefer   Benadryl [Diphenhydramine] Hives and Palpitations    Unsure, SOB    Family  History: Family History  Problem Relation Age of Onset   Hypothyroidism Mother    Heart disease Mother    Diabetes Mother    ALS Father      Past Medical History: Past  Medical History:  Diagnosis Date   Acute pancreatitis hospitalized 04/06/2016   Acute sinusitis    ADD (attention deficit disorder)    Anxiety    Blood in stool    Bronchitis    Chronic lower back pain    Depression    Dyslipidemia    Family history of adverse reaction to anesthesia    "mom PONV" (04/07/2016)   Family history of colon cancer    Fatty liver    GERD (gastroesophageal reflux disease)    Headache    "@ least once/week" (04/07/2016)   Hypertriglyceridemia    Hypertriglyceridemia    Hypothyroidism (acquired)    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 Positive for headaches, paresthesias. All other systems reviewed and negative unless stated otherwise in HPI.   Physical Exam:   Vital Signs: There were no vitals taken for this visit. GENERAL: well appearing,in no acute distress,alert SKIN:  Color, texture, turgor normal. No rashes or lesions HEAD:  Normocephalic/atraumatic. CV:  RRR RESP: Normal respiratory effort MSK: no tenderness to palpation over occiput, neck, or shoulders  NEUROLOGICAL: Mental Status: Alert, oriented to person, place and time,Follows commands Cranial Nerves: PERRL,  visual fields intact to confrontation, extraocular movements intact, slightly decreased sensation over right V1, no facial droop or ptosis, hearing grossly intact, no dysarthria Motor: muscle strength 5/5 both upper and lower extremities,no drift, normal tone Reflexes: 2+ throughout Sensation: intact to light touch all 4 extremities Coordination: Finger-to- nose-finger intact bilaterally Gait: normal-based   IMPRESSION: 47 year old male with a history of GERD, GAD, hypertriglyceridemia c/b pancreatitis who presents for follow-up of an episode of headache, numbness, and confusion.  Initially seen by Dr. Billey Gosling 04/17/2022.  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  MRI brain and CTA head/neck unremarkable.  Will complete stroke/TIA workup given persistent decreased sensation over right V1 on exam and new episode of transient decreased vision which was not associated with headache. Will start Nurtec for migraine rescue (history of triptan allergy).  PLAN: -MRI brain, CTA head/neck -Blood work: LDL, A1c -TTE -Start Nurtec 75 mg PRN for migraines    I spent *** minutes of face-to-face and non-face-to-face time with patient.  This included previsit chart review, lab review, study review, order entry, electronic health record documentation, patient education  Frann Rider, [place]  [facility] [address] [place], [place] [address]  Phone [phone] Fax [phone] Note: This document was prepared with digital dictation and possible smart phrase technology. Any transcriptional errors that result from this process are unintentional.

## 2022-10-18 ENCOUNTER — Ambulatory Visit: Payer: 59 | Admitting: Adult Health

## 2022-10-21 DIAGNOSIS — R0683 Snoring: Secondary | ICD-10-CM | POA: Diagnosis not present

## 2022-10-21 NOTE — Procedures (Signed)
     Patient Name: Connor Martin, Connor Martin Date: 10/18/2022 Gender: Male D.O.B: 14-Oct-1975 Age (years): 46 Referring Provider: Sharene Skeans Height (inches): 82 Interpreting Physician: Baird Lyons MD, ABSM Weight (lbs): 218 RPSGT: Jacolyn Reedy BMI: 28 MRN: [id number] Neck Size: 17.50  CLINICAL INFORMATION Sleep Study Type: HST Indication for sleep study: OSA Epworth Sleepiness Score: 2  SLEEP STUDY TECHNIQUE A multi-channel overnight portable sleep study was performed. The channels recorded were: nasal airflow, thoracic respiratory movement, and oxygen saturation with a pulse oximetry. Snoring was also monitored.  MEDICATIONS Patient self administered medications include: none reported.  SLEEP ARCHITECTURE Patient was studied for 363 minutes. The sleep efficiency was 100.0 % and the patient was supine for 0%. The arousal index was 0.0 per hour.  RESPIRATORY PARAMETERS The overall AHI was 50.4 per hour, with a central apnea index of 0 per hour. The oxygen nadir was 78% during sleep.  CARDIAC DATA Mean heart rate during sleep was 87.0 bpm.  IMPRESSIONS - Severe obstructive sleep apnea occurred during this study (AHI = 50.4/h). - Oxygen desaturation was noted during this study (Min O2 = 78%, Mean 94%). Time with O2 saturation 88% or less was 24.9 minutes. - Patient snored.  DIAGNOSIS - Obstructive Sleep Apnea (G47.33) - Nocturnal Hypoxemia (G47.36)  RECOMMENDATIONS - Suggest CPAP titration sleep study or autopap. Other options would be based on clinical judgment. - Be careful with alcohol, sedatives and other CNS depressants that may worsen sleep apnea and disrupt normal sleep architecture. - Sleep hygiene should be reviewed to assess factors that may improve sleep quality. - Weight management and regular exercise should be initiated or continued.  [Electronically signed] 10/21/2022 10:36 AM  Baird Lyons MD, ABSM Diplomate, American Board of Sleep  Medicine NPI: [id number]                        [place] [place] [facility]  ELECTRONICALLY SIGNED ON:  10/21/2022, 10:32 AM [place] PH: [phone]   FX: [phone] [place]

## 2022-11-09 ENCOUNTER — Other Ambulatory Visit: Payer: Self-pay

## 2022-11-14 ENCOUNTER — Other Ambulatory Visit: Payer: Self-pay

## 2022-11-14 ENCOUNTER — Other Ambulatory Visit (HOSPITAL_BASED_OUTPATIENT_CLINIC_OR_DEPARTMENT_OTHER): Payer: Self-pay

## 2022-11-15 ENCOUNTER — Other Ambulatory Visit: Payer: Self-pay

## 2022-11-16 DIAGNOSIS — F411 Generalized anxiety disorder: Secondary | ICD-10-CM | POA: Diagnosis not present

## 2022-11-17 ENCOUNTER — Other Ambulatory Visit (HOSPITAL_BASED_OUTPATIENT_CLINIC_OR_DEPARTMENT_OTHER): Payer: Self-pay

## 2022-11-17 MED ORDER — OXYCODONE-ACETAMINOPHEN 7.5-325 MG PO TABS
1.0000 | ORAL_TABLET | Freq: Two times a day (BID) | ORAL | 0 refills | Status: DC | PRN
  Filled 2022-11-17: qty 60, 30d supply, fill #0

## 2022-11-17 MED ORDER — AMPHETAMINE-DEXTROAMPHETAMINE 20 MG PO TABS
20.0000 mg | ORAL_TABLET | Freq: Three times a day (TID) | ORAL | 0 refills | Status: DC
  Filled 2022-11-17: qty 90, 30d supply, fill #0

## 2022-11-17 MED ORDER — BETAMETHASONE DIPROPIONATE 0.05 % EX OINT
TOPICAL_OINTMENT | CUTANEOUS | 0 refills | Status: DC
  Filled 2022-11-17: qty 30, 30d supply, fill #0

## 2022-11-18 ENCOUNTER — Other Ambulatory Visit (HOSPITAL_BASED_OUTPATIENT_CLINIC_OR_DEPARTMENT_OTHER): Payer: Self-pay

## 2022-11-19 ENCOUNTER — Other Ambulatory Visit (HOSPITAL_BASED_OUTPATIENT_CLINIC_OR_DEPARTMENT_OTHER): Payer: Self-pay

## 2022-11-20 ENCOUNTER — Other Ambulatory Visit (HOSPITAL_BASED_OUTPATIENT_CLINIC_OR_DEPARTMENT_OTHER): Payer: Self-pay

## 2022-11-22 ENCOUNTER — Other Ambulatory Visit (HOSPITAL_BASED_OUTPATIENT_CLINIC_OR_DEPARTMENT_OTHER): Payer: Self-pay

## 2022-11-27 ENCOUNTER — Other Ambulatory Visit (HOSPITAL_BASED_OUTPATIENT_CLINIC_OR_DEPARTMENT_OTHER): Payer: Self-pay

## 2022-11-28 ENCOUNTER — Other Ambulatory Visit (HOSPITAL_BASED_OUTPATIENT_CLINIC_OR_DEPARTMENT_OTHER): Payer: Self-pay

## 2022-11-30 ENCOUNTER — Other Ambulatory Visit (HOSPITAL_BASED_OUTPATIENT_CLINIC_OR_DEPARTMENT_OTHER): Payer: Self-pay

## 2022-12-04 ENCOUNTER — Other Ambulatory Visit (HOSPITAL_BASED_OUTPATIENT_CLINIC_OR_DEPARTMENT_OTHER): Payer: Self-pay

## 2022-12-06 ENCOUNTER — Other Ambulatory Visit (HOSPITAL_BASED_OUTPATIENT_CLINIC_OR_DEPARTMENT_OTHER): Payer: Self-pay

## 2022-12-06 MED ORDER — LEVOCETIRIZINE DIHYDROCHLORIDE 5 MG PO TABS
5.0000 mg | ORAL_TABLET | Freq: Every evening | ORAL | 1 refills | Status: AC
  Filled 2022-12-06 – 2022-12-20 (×2): qty 90, 90d supply, fill #0

## 2022-12-08 ENCOUNTER — Other Ambulatory Visit (HOSPITAL_BASED_OUTPATIENT_CLINIC_OR_DEPARTMENT_OTHER): Payer: Self-pay

## 2022-12-08 ENCOUNTER — Other Ambulatory Visit (HOSPITAL_COMMUNITY): Payer: Self-pay

## 2022-12-08 ENCOUNTER — Encounter (HOSPITAL_BASED_OUTPATIENT_CLINIC_OR_DEPARTMENT_OTHER): Payer: Self-pay

## 2022-12-09 ENCOUNTER — Other Ambulatory Visit: Payer: Self-pay

## 2022-12-11 ENCOUNTER — Other Ambulatory Visit: Payer: Self-pay

## 2022-12-11 ENCOUNTER — Other Ambulatory Visit (HOSPITAL_BASED_OUTPATIENT_CLINIC_OR_DEPARTMENT_OTHER): Payer: Self-pay

## 2022-12-12 ENCOUNTER — Other Ambulatory Visit: Payer: Self-pay

## 2022-12-13 ENCOUNTER — Other Ambulatory Visit: Payer: Self-pay

## 2022-12-13 ENCOUNTER — Other Ambulatory Visit (HOSPITAL_BASED_OUTPATIENT_CLINIC_OR_DEPARTMENT_OTHER): Payer: Self-pay

## 2022-12-13 NOTE — Progress Notes (Addendum)
Anesthesia Review:  PCP: Sharene Skeans  08/23/22  Cardiologist : Dorris Carnes- LVO 09/28/21  [place]- LOV 10/06/22  Chest x-ray : 06/28/22  EKG : 04/10/22  Echo : Stress test: Cardiac Cath :  Activity level: can do a flight of stairs without difficutly  Sleep Study/ CPAP : none .  Fasting Blood Sugar :      / Checks Blood Sugar -- times a day:   Blood Thinner/ Instructions /Last Dose: ASA / Instructions/ Last Dose :    PT was approximately 20 minutes late for preop appt.   PT at preop asked questions in regards to PT after surgery.  Instructed pt to call surgery Scheduler. PT voiced understanding  PT states mother owns [employer].

## 2022-12-14 ENCOUNTER — Other Ambulatory Visit: Payer: Self-pay

## 2022-12-15 ENCOUNTER — Other Ambulatory Visit: Payer: Self-pay

## 2022-12-15 ENCOUNTER — Other Ambulatory Visit (HOSPITAL_BASED_OUTPATIENT_CLINIC_OR_DEPARTMENT_OTHER): Payer: Self-pay

## 2022-12-15 NOTE — Patient Instructions (Addendum)
SURGICAL WAITING ROOM VISITATION  Patients having surgery or a procedure may have no more than 2 support people in the waiting area - these visitors may rotate.    Children under the age of 73 must have an adult with them who is not the patient.  Due to an increase in RSV and influenza rates and associated hospitalizations, children ages 56 and under may not visit patients in [place].  If the patient needs to stay at the hospital during part of their recovery, the visitor guidelines for inpatient rooms apply. Pre-op nurse will coordinate an appropriate time for 1 support person to accompany patient in pre-op.  This support person may not rotate.    Please refer to the [facility] website for the visitor guidelines for Inpatients (after your surgery is over and you are in a regular room).       Your procedure is scheduled on:  12/29/2022    Report to [facility] Main Entrance    Report to admitting at  [place] AM   Call this number if you have problems the morning of surgery [phone]   Do not eat food :After Midnight.   After Midnight you may have the following liquids until _ 0545_____ AM DAY OF SURGERY  Water Non-Citrus Juices (without pulp, NO RED-Apple, White grape, White cranberry) Black Coffee (NO MILK/CREAM OR CREAMERS, sugar ok)  Clear Tea (NO MILK/CREAM OR CREAMERS, sugar ok) regular and decaf                             Plain Jell-O (NO RED)                                           Fruit ices (not with fruit pulp, NO RED)                                     Popsicles (NO RED)                                                               Sports drinks like Gatorade (NO RED)                     The day of surgery:  Drink ONE (1) Pre-Surgery Clear Ensure or G2 at  0545  AM  ( have completed by )  the morning of surgery. Drink in one sitting. Do not sip.  This drink was given to you during your hospital  pre-op appointment visit. Nothing else to  drink after completing the  Pre-Surgery Clear Ensure or G2.          If you have questions, please contact your surgeon's office.       Oral Hygiene is also important to reduce your risk of infection.                                    Remember - BRUSH YOUR TEETH THE MORNING  OF SURGERY WITH YOUR REGULAR TOOTHPASTE  DENTURES WILL BE REMOVED PRIOR TO SURGERY PLEASE DO NOT APPLY "Poly grip" OR ADHESIVES!!!   Do NOT smoke after Midnight   Take these medicines the morning of surgery with A SIP OF WATER:  inhalers as usual and bring, nexium   DO NOT TAKE ANY ORAL DIABETIC MEDICATIONS DAY OF YOUR SURGERY  Bring CPAP mask and tubing day of surgery.                              You may not have any metal on your body including hair pins, jewelry, and body piercing             Do not wear make-up, lotions, powders, perfumes/cologne, or deodorant  Do not wear nail polish including gel and S&S, artificial/acrylic nails, or any other type of covering on natural nails including finger and toenails. If you have artificial nails, gel coating, etc. that needs to be removed by a nail salon please have this removed prior to surgery or surgery may need to be canceled/ delayed if the surgeon/ anesthesia feels like they are unable to be safely monitored.   Do not shave  48 hours prior to surgery.               Men may shave face and neck.   Do not bring valuables to the hospital. [place].   Contacts, glasses, dentures or bridgework may not be worn into surgery.   Bring small overnight bag day of surgery.   DO NOT [place]. PHARMACY WILL DISPENSE MEDICATIONS LISTED ON YOUR MEDICATION LIST TO YOU DURING YOUR ADMISSION [place]!    Patients discharged on the day of surgery will not be allowed to drive home.  Someone NEEDS to stay with you for the first 24 hours after anesthesia.   Special Instructions:  Bring a copy of your healthcare power of attorney and living will documents the day of surgery if you haven't scanned them before.              Please read over the following fact sheets you were given: IF [place] [phone]   If you received a COVID test during your pre-op visit  it is requested that you wear a mask when out in public, stay away from anyone that may not be feeling well and notify your surgeon if you develop symptoms. If you test positive for Covid or have been in contact with anyone that has tested positive in the last 10 days please notify you surgeon.    [place] - Preparing for Surgery Before surgery, you can play an important role.  Because skin is not sterile, your skin needs to be as free of germs as possible.  You can reduce the number of germs on your skin by washing with CHG (chlorahexidine gluconate) soap before surgery.  CHG is an antiseptic cleaner which kills germs and bonds with the skin to continue killing germs even after washing. Please DO NOT use if you have an allergy to CHG or antibacterial soaps.  If your skin becomes reddened/irritated stop using the CHG and inform your nurse when you arrive at Short Stay. Do not shave (including legs and underarms) for at least  48 hours prior to the first CHG shower.  You may shave your face/neck. Please follow these instructions carefully:  1.  Shower with CHG Soap  starting 5 days prior to surgery   2.  If you choose to wash your hair, wash your hair first as usual with your  normal  shampoo.  3.  After you shampoo, rinse your hair and body thoroughly to remove the  shampoo.                           4.  Use CHG as you would any other liquid soap.  You can apply chg directly  to the skin and wash                       Gently with a scrungie or clean washcloth.  5.  Apply the CHG Soap to your body ONLY FROM THE NECK DOWN.   Do not use on face/ open                            Wound or open sores. Avoid contact with eyes, ears mouth and genitals (private parts).                       Wash face,  Genitals (private parts) with your normal soap.             6.  Wash thoroughly, paying special attention to the area where your surgery  will be performed.  7.  Thoroughly rinse your body with warm water from the neck down.  8.  DO NOT shower/wash with your normal soap after using and rinsing off  the CHG Soap.                9.  Pat yourself dry with a clean towel.            10.  Wear clean pajamas.            11.  Place clean sheets on your bed the night of your first shower and do not  sleep with pets. Day of Surgery : Do not apply any lotions/deodorants the morning of surgery.  Please wear clean clothes to the hospital/surgery center.  FAILURE TO FOLLOW THESE INSTRUCTIONS MAY RESULT IN THE CANCELLATION OF YOUR SURGERY PATIENT SIGNATURE_________________________________  NURSE SIGNATURE__________________________________  ________________________________________________________________________

## 2022-12-16 ENCOUNTER — Other Ambulatory Visit: Payer: Self-pay | Admitting: Physician Assistant

## 2022-12-16 DIAGNOSIS — Z01818 Encounter for other preprocedural examination: Secondary | ICD-10-CM

## 2022-12-18 ENCOUNTER — Encounter (HOSPITAL_COMMUNITY)
Admission: RE | Admit: 2022-12-18 | Discharge: 2022-12-18 | Disposition: A | Discharge status: Home / Self Care | Payer: 59 | Source: Ambulatory Visit | Attending: Orthopaedic Surgery | Admitting: Orthopaedic Surgery

## 2022-12-18 ENCOUNTER — Other Ambulatory Visit (HOSPITAL_BASED_OUTPATIENT_CLINIC_OR_DEPARTMENT_OTHER): Payer: Self-pay

## 2022-12-18 ENCOUNTER — Other Ambulatory Visit: Payer: Self-pay

## 2022-12-18 ENCOUNTER — Encounter (HOSPITAL_COMMUNITY): Payer: Self-pay

## 2022-12-18 VITALS — BP 154/87 | HR 78 | Temp 98.0°F | Resp 16 | Ht 74.0 in | Wt 237.0 lb

## 2022-12-18 DIAGNOSIS — Z01818 Encounter for other preprocedural examination: Secondary | ICD-10-CM

## 2022-12-18 DIAGNOSIS — Z01812 Encounter for preprocedural laboratory examination: Secondary | ICD-10-CM | POA: Insufficient documentation

## 2022-12-18 LAB — CBC
HCT: 41 % (ref 39.0–52.0)
Hemoglobin: 13.6 g/dL (ref 13.0–17.0)
MCH: 32.5 pg (ref 26.0–34.0)
MCHC: 33.2 g/dL (ref 30.0–36.0)
MCV: 97.9 fL (ref 80.0–100.0)
Platelets: 119 10*3/uL — ABNORMAL LOW (ref 150–400)
RBC: 4.19 MIL/uL — ABNORMAL LOW (ref 4.22–5.81)
RDW: 11.9 % (ref 11.5–15.5)
WBC: 5.4 10*3/uL (ref 4.0–10.5)
nRBC: 0 % (ref 0.0–0.2)

## 2022-12-18 LAB — COMPREHENSIVE METABOLIC PANEL
ALT: 28 U/L (ref 0–44)
AST: 36 U/L (ref 15–41)
Albumin: 4.4 g/dL (ref 3.5–5.0)
Alkaline Phosphatase: 72 U/L (ref 38–126)
Anion gap: 8 (ref 5–15)
BUN: 18 mg/dL (ref 6–20)
CO2: 25 mmol/L (ref 22–32)
Calcium: 9.3 mg/dL (ref 8.9–10.3)
Chloride: 104 mmol/L (ref 98–111)
Creatinine, Ser: 0.89 mg/dL (ref 0.61–1.24)
GFR, Estimated: >60 mL/min (ref >60)
Glucose, Bld: 122 mg/dL — ABNORMAL HIGH (ref 70–99)
Potassium: 3.8 mmol/L (ref 3.5–5.1)
Sodium: 137 mmol/L (ref 135–145)
Total Bilirubin: 1 mg/dL (ref 0.3–1.2)
Total Protein: 7.7 g/dL (ref 6.5–8.1)

## 2022-12-18 LAB — TYPE AND SCREEN
ABO/RH(D): A POS
Antibody Screen: NEGATIVE

## 2022-12-18 LAB — SURGICAL PCR SCREEN
MRSA, PCR: POSITIVE — AB
Staphylococcus aureus: POSITIVE — AB

## 2022-12-19 DIAGNOSIS — J342 Deviated nasal septum: Secondary | ICD-10-CM | POA: Diagnosis not present

## 2022-12-19 DIAGNOSIS — G4733 Obstructive sleep apnea (adult) (pediatric): Secondary | ICD-10-CM | POA: Diagnosis not present

## 2022-12-19 DIAGNOSIS — J329 Chronic sinusitis, unspecified: Secondary | ICD-10-CM | POA: Diagnosis not present

## 2022-12-20 ENCOUNTER — Other Ambulatory Visit (HOSPITAL_BASED_OUTPATIENT_CLINIC_OR_DEPARTMENT_OTHER): Payer: Self-pay

## 2022-12-22 ENCOUNTER — Other Ambulatory Visit (HOSPITAL_BASED_OUTPATIENT_CLINIC_OR_DEPARTMENT_OTHER): Payer: Self-pay

## 2022-12-25 ENCOUNTER — Other Ambulatory Visit (HOSPITAL_BASED_OUTPATIENT_CLINIC_OR_DEPARTMENT_OTHER): Payer: Self-pay

## 2022-12-26 ENCOUNTER — Other Ambulatory Visit (HOSPITAL_BASED_OUTPATIENT_CLINIC_OR_DEPARTMENT_OTHER): Payer: Self-pay

## 2022-12-28 NOTE — H&P (Signed)
TOTAL HIP ADMISSION H&P  Patient is admitted for right total hip arthroplasty.  Subjective:  Chief Complaint: right hip pain  HPI: Connor Martin, 47 y.o. male, has a history of pain and functional disability in the right hip(s) due to  avascular necrosis  and patient has failed non-surgical conservative treatments to include NSAID's and/or analgesics and activity modification.  Onset of symptoms was abrupt starting 9 months ago with rapidlly worsening course since that time.The patient noted no past surgery on the right hip(s).  Patient currently rates pain in the right hip at 10 out of 10 with activity. Patient has night pain, worsening of pain with activity and weight bearing, and pain with passive range of motion. Patient has evidence of subchondral cysts and avascular necrosis  by imaging studies. This condition presents safety issues increasing the risk of falls. This patient has had avascular necrosis of the hip, acetabular fracture, hip dysplasia.  There is no current active infection.  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   Adjustment disorder with depressed mood 02/25/2020   Nutritional anemia 02/25/2020   Fatty liver 09/02/2016   History of panic attacks 08/30/2016   GAD (generalized anxiety disorder) 08/30/2016   Gastroesophageal reflux disease without esophagitis    High blood triglycerides    Pancreatitis 04/07/2016   Abnormal LFTs 04/06/2016   Alcohol use 04/06/2016   Cough 04/06/2016   Pancreatitis, acute 04/06/2016   Panic attacks    GERD (gastroesophageal reflux disease)    Past Medical History:  Diagnosis Date   Acute pancreatitis hospitalized 04/06/2016   Acute sinusitis    ADD (attention deficit disorder)    Anxiety    Blood in stool    Bronchitis    Chronic lower back pain    Depression    Dyslipidemia    Family  history of colon cancer    Fatty liver    GERD (gastroesophageal reflux disease)    Headache    "@ least once/week" (04/07/2016)   Hypertriglyceridemia    Hypertriglyceridemia    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    Migraine    "q other month" (04/07/2016)   Nasal congestion    Palpitations    Pancreatitis    Panic attacks    Pneumonia ~ 1981   Sleep apnea    Snoring     Past Surgical History:  Procedure Laterality Date   TONSILLECTOMY AND ADENOIDECTOMY  ~ 1990    No current facility-administered medications for this encounter.   Current Outpatient Medications  Medication Sig Dispense Refill Last Dose   ALPRAZolam (XANAX) 0.5 MG tablet Take 1 tablet by mouth 2 times every day as needed. 40 tablet 2    amphetamine-dextroamphetamine (ADDERALL) 20 MG tablet Take 1 tablet (20 mg total) by mouth 3 (three) times daily. 90 tablet 0    betamethasone dipropionate (DIPROLENE) 0.05 % ointment Apply a thin layer to affected area(s) twice daily. 30 g 0    Biotin 1 MG CAPS Take 1 mg by mouth daily at 12 noon.      esomeprazole (NEXIUM) 40 MG capsule take 1 capsule by oral route  every day 90 capsule 3    fenofibrate 160 MG tablet TAKE 1 TABLET BY MOUTH EVERY DAY 90 tablet 3    imiquimod (ALDARA) 5 % cream Apply topically 5 nights per week, use occlusive  dressing, wash off after 8-10 hours. 24 each 2    levocetirizine (XYZAL) 5 MG tablet Take 1 tablet (5 mg total) by mouth every evening. 90 tablet 1    magnesium oxide (MAG-OX) 400 MG tablet Take 400 mg by mouth daily.      Milk Thistle 200 MG CAPS Take 200 mg by mouth daily.      Multiple Vitamin (MULTIVITAMIN WITH MINERALS) TABS tablet Take 1 tablet by mouth daily.      omega-3 acid ethyl esters (LOVAZA) 1 g capsule take 2 capsule by oral route 2 times every day 360 capsule 1    oxyCODONE-acetaminophen (PERCOCET) 7.5-325 MG tablet Take 1 tablet by mouth every 12 (twelve) hours as needed. 60 tablet 0    Rimegepant Sulfate (NURTEC) 75 MG  TBDP Take 75 mg by mouth as needed. 8 tablet 6    vitamin E 1000 UNIT capsule Take 1,000 Units by mouth daily.      Zinc Gluconate 100 MG TABS Take 100 mg by mouth daily.      albuterol (VENTOLIN HFA) 108 (90 Base) MCG/ACT inhaler Inhale 2 puffs into the lungs every 4 (four) hours as needed for difficulty breath. (Patient not taking: Reported on 12/13/2022) 6.7 g 0 Not Taking   betamethasone dipropionate (DIPROLENE) 0.05 % ointment Apply topically 2 times every day, use a thin layer to the affected area(s). (Patient not taking: Reported on 12/13/2022) 30 g 0 Not Taking   fluticasone (FLONASE) 50 MCG/ACT nasal spray Place 2 sprays into both nostrils daily. (Patient not taking: Reported on 12/13/2022) 16 g 2 Not Taking   fluticasone (FLOVENT HFA) 110 MCG/ACT inhaler Inhale 2 puffs into the lungs in the morning and at bedtime. (Patient not taking: Reported on 12/13/2022) 1 each 12 Not Taking   levofloxacin (LEVAQUIN) 750 MG tablet Take 1 tablet (750 mg total) by mouth daily. (Patient not taking: Reported on 12/13/2022) 7 tablet 0 Not Taking   Allergies  Allergen Reactions   Imitrex [Sumatriptan] Swelling    Brain   Latex Other (See Comments)    Does not prefer   Benadryl [Diphenhydramine] Hives and Palpitations    Unsure, SOB    Social History   Tobacco Use   Smoking status: Never   Smokeless tobacco: Never  Substance Use Topics   Alcohol use: Yes    Alcohol/week: 10.0 standard drinks of alcohol    Types: 10 Shots of liquor per week    Comment: social    Family History  Problem Relation Age of Onset   Hypothyroidism Mother    Heart disease Mother    Diabetes Mother    ALS Father      Review of Systems  All other systems reviewed and are negative.   Objective:  Physical Exam Vitals reviewed.  Constitutional:      Appearance: Normal appearance. He is normal weight.  HENT:     Head: Normocephalic and atraumatic.  Eyes:     Extraocular Movements: Extraocular movements intact.      Pupils: Pupils are equal, round, and reactive to light.  Cardiovascular:     Rate and Rhythm: Normal rate and regular rhythm.     Pulses: Normal pulses.  Pulmonary:     Effort: Pulmonary effort is normal.     Breath sounds: Normal breath sounds.  Abdominal:     Palpations: Abdomen is soft.  Musculoskeletal:     Cervical back: Normal range of motion and neck supple.     Right hip:  Tenderness and bony tenderness present.  Neurological:     Mental Status: He is alert and oriented to person, place, and time.  Psychiatric:        Behavior: Behavior normal.     Vital signs in last 24 hours:    Labs:   Estimated body mass index is 30.43 kg/m as calculated from the following:   Height as of 12/18/22: 6\' 2"  (1.88 m).   Weight as of 12/18/22: 107.5 kg.   Imaging Review MRI demonstrates severe AVN of the right hip(s). The bone quality appears to be excellent for age and reported activity level.      Assessment/Plan:  End avascular necrosis, right hip(s)  The patient history, physical examination, clinical judgement of the provider and imaging studies are consistent with AVN the right hip(s) and total hip arthroplasty is deemed medically necessary. The treatment options including medical management, injection therapy, arthroscopy and arthroplasty were discussed at length. The risks and benefits of total hip arthroplasty were presented and reviewed. The risks due to aseptic loosening, infection, stiffness, dislocation/subluxation,  thromboembolic complications and other imponderables were discussed.  The patient acknowledged the explanation, agreed to proceed with the plan and consent was signed. Patient is being admitted for inpatient treatment for surgery, pain control, PT, OT, prophylactic antibiotics, VTE prophylaxis, progressive ambulation and ADL's and discharge planning.The patient is planning to be discharged home with home health services

## 2022-12-29 ENCOUNTER — Observation Stay (HOSPITAL_COMMUNITY)
Admission: RE | Admit: 2022-12-29 | Discharge: 2022-12-30 | Disposition: A | Discharge status: Home Health | Payer: 59 | Source: Ambulatory Visit | Attending: Orthopaedic Surgery | Admitting: Orthopaedic Surgery

## 2022-12-29 ENCOUNTER — Other Ambulatory Visit: Payer: Self-pay

## 2022-12-29 ENCOUNTER — Observation Stay (HOSPITAL_COMMUNITY): Payer: 59

## 2022-12-29 ENCOUNTER — Encounter (HOSPITAL_COMMUNITY)
Admission: RE | Disposition: A | Discharge status: Home Health | Payer: Self-pay | Source: Ambulatory Visit | Attending: Orthopaedic Surgery

## 2022-12-29 ENCOUNTER — Ambulatory Visit (HOSPITAL_COMMUNITY): Payer: 59

## 2022-12-29 ENCOUNTER — Ambulatory Visit (HOSPITAL_COMMUNITY): Payer: 59 | Admitting: Physician Assistant

## 2022-12-29 ENCOUNTER — Ambulatory Visit (HOSPITAL_BASED_OUTPATIENT_CLINIC_OR_DEPARTMENT_OTHER): Payer: 59 | Admitting: Certified Registered Nurse Anesthetist

## 2022-12-29 ENCOUNTER — Encounter (HOSPITAL_COMMUNITY): Payer: Self-pay | Admitting: Orthopaedic Surgery

## 2022-12-29 DIAGNOSIS — M879 Osteonecrosis, unspecified: Secondary | ICD-10-CM

## 2022-12-29 DIAGNOSIS — M87051 Idiopathic aseptic necrosis of right femur: Secondary | ICD-10-CM | POA: Diagnosis not present

## 2022-12-29 DIAGNOSIS — M87851 Other osteonecrosis, right femur: Principal | ICD-10-CM | POA: Insufficient documentation

## 2022-12-29 DIAGNOSIS — Z91040 Latex allergy status: Secondary | ICD-10-CM | POA: Diagnosis not present

## 2022-12-29 DIAGNOSIS — Z79899 Other long term (current) drug therapy: Secondary | ICD-10-CM | POA: Diagnosis not present

## 2022-12-29 DIAGNOSIS — G4733 Obstructive sleep apnea (adult) (pediatric): Secondary | ICD-10-CM | POA: Diagnosis not present

## 2022-12-29 DIAGNOSIS — Z9989 Dependence on other enabling machines and devices: Secondary | ICD-10-CM

## 2022-12-29 DIAGNOSIS — Z01818 Encounter for other preprocedural examination: Secondary | ICD-10-CM

## 2022-12-29 DIAGNOSIS — F418 Other specified anxiety disorders: Secondary | ICD-10-CM

## 2022-12-29 DIAGNOSIS — M87052 Idiopathic aseptic necrosis of left femur: Secondary | ICD-10-CM | POA: Diagnosis present

## 2022-12-29 DIAGNOSIS — Z96641 Presence of right artificial hip joint: Secondary | ICD-10-CM

## 2022-12-29 HISTORY — PX: TOTAL HIP ARTHROPLASTY: SHX124

## 2022-12-29 LAB — CBC
HCT: 38.1 % — ABNORMAL LOW (ref 39.0–52.0)
Hemoglobin: 12.9 g/dL — ABNORMAL LOW (ref 13.0–17.0)
MCH: 32 pg (ref 26.0–34.0)
MCHC: 33.9 g/dL (ref 30.0–36.0)
MCV: 94.5 fL (ref 80.0–100.0)
Platelets: 116 10*3/uL — ABNORMAL LOW (ref 150–400)
RBC: 4.03 MIL/uL — ABNORMAL LOW (ref 4.22–5.81)
RDW: 11.8 % (ref 11.5–15.5)
WBC: 5 10*3/uL (ref 4.0–10.5)
nRBC: 0 % (ref 0.0–0.2)

## 2022-12-29 LAB — ABO/RH: ABO/RH(D): A POS

## 2022-12-29 SURGERY — ARTHROPLASTY, HIP, TOTAL, ANTERIOR APPROACH
Anesthesia: Spinal | Site: Hip | Laterality: Right

## 2022-12-29 MED ORDER — MIDAZOLAM HCL 5 MG/5ML IJ SOLN
INTRAMUSCULAR | Status: DC | PRN
  Administered 2022-12-29: 2 mg via INTRAVENOUS

## 2022-12-29 MED ORDER — MENTHOL 3 MG MT LOZG: 1.0000 | LOZENGE | OROMUCOSAL | Status: DC | PRN

## 2022-12-29 MED ORDER — HYDROMORPHONE HCL 2 MG PO TABS
2.0000 mg | ORAL_TABLET | ORAL | Status: DC | PRN
  Administered 2022-12-29: 2 mg via ORAL
  Filled 2022-12-29: qty 1

## 2022-12-29 MED ORDER — METOCLOPRAMIDE HCL 5 MG/ML IJ SOLN: 5.0000 mg | Freq: Three times a day (TID) | INTRAMUSCULAR | Status: DC | PRN

## 2022-12-29 MED ORDER — LACTATED RINGERS IV SOLN: INTRAVENOUS | Status: DC

## 2022-12-29 MED ORDER — DOCUSATE SODIUM 100 MG PO CAPS
100.0000 mg | ORAL_CAPSULE | Freq: Two times a day (BID) | ORAL | Status: DC
  Administered 2022-12-29 – 2022-12-30 (×2): 100 mg via ORAL
  Filled 2022-12-29 (×2): qty 1

## 2022-12-29 MED ORDER — PROPOFOL 500 MG/50ML IV EMUL
INTRAVENOUS | Status: DC | PRN
  Administered 2022-12-29: 75 ug/kg/min via INTRAVENOUS

## 2022-12-29 MED ORDER — DEXAMETHASONE SODIUM PHOSPHATE 10 MG/ML IJ SOLN
INTRAMUSCULAR | Status: DC | PRN
  Administered 2022-12-29: 10 mg via INTRAVENOUS

## 2022-12-29 MED ORDER — HYDROMORPHONE HCL 1 MG/ML IJ SOLN
0.5000 mg | INTRAMUSCULAR | Status: DC | PRN
  Administered 2022-12-29 – 2022-12-30 (×2): 1 mg via INTRAVENOUS
  Filled 2022-12-29 (×2): qty 1

## 2022-12-29 MED ORDER — FENTANYL CITRATE (PF) 100 MCG/2ML IJ SOLN
INTRAMUSCULAR | Status: AC
  Filled 2022-12-29: qty 2

## 2022-12-29 MED ORDER — BUPIVACAINE IN DEXTROSE 0.75-8.25 % IT SOLN
INTRATHECAL | Status: DC | PRN
  Administered 2022-12-29: 2 mL via INTRATHECAL

## 2022-12-29 MED ORDER — ACETAMINOPHEN 500 MG PO TABS
ORAL_TABLET | ORAL | Status: AC
  Administered 2022-12-29: 1000 mg via ORAL
  Filled 2022-12-29: qty 2

## 2022-12-29 MED ORDER — MIDAZOLAM HCL 2 MG/2ML IJ SOLN
INTRAMUSCULAR | Status: AC
  Filled 2022-12-29: qty 2

## 2022-12-29 MED ORDER — HYDROMORPHONE HCL 2 MG PO TABS
4.0000 mg | ORAL_TABLET | ORAL | Status: DC | PRN
  Administered 2022-12-29 – 2022-12-30 (×3): 4 mg via ORAL
  Filled 2022-12-29 (×3): qty 2

## 2022-12-29 MED ORDER — 0.9 % SODIUM CHLORIDE (POUR BTL) OPTIME
TOPICAL | Status: DC | PRN
  Administered 2022-12-29: 1000 mL

## 2022-12-29 MED ORDER — SODIUM CHLORIDE 0.9 % IR SOLN
Status: DC | PRN
  Administered 2022-12-29: 1000 mL

## 2022-12-29 MED ORDER — METOCLOPRAMIDE HCL 5 MG PO TABS: 5.0000 mg | ORAL_TABLET | Freq: Three times a day (TID) | ORAL | Status: DC | PRN

## 2022-12-29 MED ORDER — CEFAZOLIN SODIUM-DEXTROSE 2-4 GM/100ML-% IV SOLN
2.0000 g | INTRAVENOUS | Status: AC
  Administered 2022-12-29: 2 g via INTRAVENOUS
  Filled 2022-12-29: qty 100

## 2022-12-29 MED ORDER — ACETAMINOPHEN 325 MG PO TABS
325.0000 mg | ORAL_TABLET | Freq: Four times a day (QID) | ORAL | Status: DC | PRN
  Administered 2022-12-30: 650 mg via ORAL
  Filled 2022-12-29: qty 2

## 2022-12-29 MED ORDER — METHOCARBAMOL 500 MG PO TABS
500.0000 mg | ORAL_TABLET | Freq: Four times a day (QID) | ORAL | Status: DC | PRN
  Administered 2022-12-29: 500 mg via ORAL
  Filled 2022-12-29: qty 1

## 2022-12-29 MED ORDER — ONDANSETRON HCL 4 MG/2ML IJ SOLN: 4.0000 mg | Freq: Four times a day (QID) | INTRAMUSCULAR | Status: DC | PRN

## 2022-12-29 MED ORDER — ONDANSETRON HCL 4 MG PO TABS: 4.0000 mg | ORAL_TABLET | Freq: Four times a day (QID) | ORAL | Status: DC | PRN

## 2022-12-29 MED ORDER — SODIUM CHLORIDE 0.9 % IV SOLN: INTRAVENOUS | Status: DC

## 2022-12-29 MED ORDER — GABAPENTIN 100 MG PO CAPS
100.0000 mg | ORAL_CAPSULE | Freq: Three times a day (TID) | ORAL | Status: DC
  Administered 2022-12-29 – 2022-12-30 (×3): 100 mg via ORAL
  Filled 2022-12-29 (×3): qty 1

## 2022-12-29 MED ORDER — OXYCODONE HCL 5 MG PO TABS
5.0000 mg | ORAL_TABLET | ORAL | Status: DC | PRN
  Administered 2022-12-29: 10 mg via ORAL
  Filled 2022-12-29: qty 2

## 2022-12-29 MED ORDER — PHENOL 1.4 % MT LIQD: 1.0000 | OROMUCOSAL | Status: DC | PRN

## 2022-12-29 MED ORDER — ALUM & MAG HYDROXIDE-SIMETH 200-200-20 MG/5ML PO SUSP: 30.0000 mL | ORAL | Status: DC | PRN

## 2022-12-29 MED ORDER — METHOCARBAMOL 1000 MG/10ML IJ SOLN: 500.0000 mg | Freq: Four times a day (QID) | INTRAVENOUS | Status: DC | PRN

## 2022-12-29 MED ORDER — ACETAMINOPHEN 500 MG PO TABS: 1000.0000 mg | ORAL_TABLET | Freq: Once | ORAL | Status: AC

## 2022-12-29 MED ORDER — OXYCODONE HCL 5 MG/5ML PO SOLN: 5.0000 mg | Freq: Once | ORAL | Status: DC | PRN

## 2022-12-29 MED ORDER — ONDANSETRON HCL 4 MG/2ML IJ SOLN
INTRAMUSCULAR | Status: DC | PRN
  Administered 2022-12-29: 4 mg via INTRAVENOUS

## 2022-12-29 MED ORDER — VANCOMYCIN HCL IN DEXTROSE 1-5 GM/200ML-% IV SOLN
1000.0000 mg | Freq: Two times a day (BID) | INTRAVENOUS | Status: AC
  Administered 2022-12-29: 1000 mg via INTRAVENOUS
  Filled 2022-12-29: qty 200

## 2022-12-29 MED ORDER — ASPIRIN 81 MG PO CHEW
81.0000 mg | CHEWABLE_TABLET | Freq: Two times a day (BID) | ORAL | Status: DC
  Administered 2022-12-29 – 2022-12-30 (×2): 81 mg via ORAL
  Filled 2022-12-29 (×2): qty 1

## 2022-12-29 MED ORDER — VANCOMYCIN HCL 1500 MG/300ML IV SOLN
1500.0000 mg | INTRAVENOUS | Status: AC
  Administered 2022-12-29: 1500 mg via INTRAVENOUS
  Filled 2022-12-29: qty 300

## 2022-12-29 MED ORDER — AMISULPRIDE (ANTIEMETIC) 5 MG/2ML IV SOLN: 10.0000 mg | Freq: Once | INTRAVENOUS | Status: DC | PRN

## 2022-12-29 MED ORDER — HYDROMORPHONE HCL 1 MG/ML IJ SOLN: 0.2500 mg | INTRAMUSCULAR | Status: DC | PRN

## 2022-12-29 MED ORDER — FENTANYL CITRATE (PF) 100 MCG/2ML IJ SOLN
INTRAMUSCULAR | Status: DC | PRN
  Administered 2022-12-29: 100 ug via INTRAVENOUS

## 2022-12-29 MED ORDER — TRANEXAMIC ACID-NACL 1000-0.7 MG/100ML-% IV SOLN
1000.0000 mg | INTRAVENOUS | Status: AC
  Administered 2022-12-29: 1000 mg via INTRAVENOUS
  Filled 2022-12-29: qty 100

## 2022-12-29 MED ORDER — PANTOPRAZOLE SODIUM 40 MG PO TBEC
40.0000 mg | DELAYED_RELEASE_TABLET | Freq: Every day | ORAL | Status: DC
  Administered 2022-12-29 – 2022-12-30 (×2): 40 mg via ORAL
  Filled 2022-12-29 (×2): qty 1

## 2022-12-29 MED ORDER — ONDANSETRON HCL 4 MG/2ML IJ SOLN: 4.0000 mg | Freq: Once | INTRAMUSCULAR | Status: DC | PRN

## 2022-12-29 MED ORDER — OXYCODONE HCL 5 MG PO TABS: 5.0000 mg | ORAL_TABLET | Freq: Once | ORAL | Status: DC | PRN

## 2022-12-29 MED ORDER — CHLORHEXIDINE GLUCONATE 0.12 % MT SOLN
15.0000 mL | Freq: Once | OROMUCOSAL | Status: AC
  Administered 2022-12-29: 15 mL via OROMUCOSAL

## 2022-12-29 MED ORDER — POVIDONE-IODINE 10 % EX SWAB: 2.0000 | Freq: Once | CUTANEOUS | Status: DC

## 2022-12-29 SURGICAL SUPPLY — 43 items
APL SKNCLS STERI-STRIP NONHPOA (GAUZE/BANDAGES/DRESSINGS)
BAG COUNTER SPONGE SURGICOUNT (BAG) ×2 IMPLANT
BAG SPEC THK2 15X12 ZIP CLS (MISCELLANEOUS) ×1
BAG SPNG CNTER NS LX DISP (BAG) ×1
BAG ZIPLOCK 12X15 (MISCELLANEOUS) IMPLANT
BENZOIN TINCTURE PRP APPL 2/3 (GAUZE/BANDAGES/DRESSINGS) IMPLANT
BLADE SAW SGTL 18X1.27X75 (BLADE) ×2 IMPLANT
COVER PERINEAL POST (MISCELLANEOUS) ×2 IMPLANT
COVER SURGICAL LIGHT HANDLE (MISCELLANEOUS) ×2 IMPLANT
CUP SECTOR GRIPTON 58MM (Orthopedic Implant) IMPLANT
DRAPE FOOT SWITCH (DRAPES) ×2 IMPLANT
DRAPE STERI IOBAN 125X83 (DRAPES) ×2 IMPLANT
DRAPE U-SHAPE 47X51 STRL (DRAPES) ×4 IMPLANT
DRSG AQUACEL AG ADV 3.5X10 (GAUZE/BANDAGES/DRESSINGS) ×2 IMPLANT
DURAPREP 26ML APPLICATOR (WOUND CARE) ×2 IMPLANT
ELECT REM PT RETURN 15FT ADLT (MISCELLANEOUS) ×2 IMPLANT
GAUZE XEROFORM 1X8 LF (GAUZE/BANDAGES/DRESSINGS) ×2 IMPLANT
GLOVE BIO SURGEON STRL SZ7.5 (GLOVE) ×2 IMPLANT
GLOVE BIOGEL PI IND STRL 8 (GLOVE) ×4 IMPLANT
GLOVE ECLIPSE 8.0 STRL XLNG CF (GLOVE) ×2 IMPLANT
GOWN STRL REUS W/ TWL XL LVL3 (GOWN DISPOSABLE) ×4 IMPLANT
GOWN STRL REUS W/TWL XL LVL3 (GOWN DISPOSABLE) ×2
HANDPIECE INTERPULSE COAX TIP (DISPOSABLE) ×1
HEAD CERAMIC 36 PLUS5 (Hips) IMPLANT
HOLDER FOLEY CATH W/STRAP (MISCELLANEOUS) ×2 IMPLANT
KIT TURNOVER KIT A (KITS) IMPLANT
LINER NEUTRAL 36X58 PLUS4 IMPLANT
PACK ANTERIOR HIP CUSTOM (KITS) ×2 IMPLANT
SET HNDPC FAN SPRY TIP SCT (DISPOSABLE) ×2 IMPLANT
STAPLER VISISTAT 35W (STAPLE) IMPLANT
STEM FEMORAL SZ5 HIGH ACTIS (Stem) IMPLANT
STRIP CLOSURE SKIN 1/2X4 (GAUZE/BANDAGES/DRESSINGS) IMPLANT
SUT ETHIBOND NAB CT1 #1 30IN (SUTURE) ×2 IMPLANT
SUT ETHILON 2 0 PS N (SUTURE) IMPLANT
SUT MNCRL AB 4-0 PS2 18 (SUTURE) IMPLANT
SUT VIC AB 0 CT1 36 (SUTURE) ×2 IMPLANT
SUT VIC AB 1 CT1 36 (SUTURE) ×2 IMPLANT
SUT VIC AB 2-0 CT1 27 (SUTURE) ×2
SUT VIC AB 2-0 CT1 TAPERPNT 27 (SUTURE) ×4 IMPLANT
TRAY FOL W/BAG SLVR 16FR STRL (SET/KITS/TRAYS/PACK) IMPLANT
TRAY FOLEY MTR SLVR 16FR STAT (SET/KITS/TRAYS/PACK) IMPLANT
TRAY FOLEY W/BAG SLVR 16FR LF (SET/KITS/TRAYS/PACK) ×1
YANKAUER SUCT BULB TIP NO VENT (SUCTIONS) ×2 IMPLANT

## 2022-12-29 NOTE — Anesthesia Procedure Notes (Signed)
Spinal  Patient location during procedure: OR Start time: 12/29/2022 8:38 AM End time: 12/29/2022 8:41 AM Reason for block: surgical anesthesia Staffing Performed: anesthesiologist  Anesthesiologist: Mal Amabile, MD Performed by: Mal Amabile, MD Authorized by: Mal Amabile, MD   Preanesthetic Checklist Completed: patient identified, IV checked, site marked, risks and benefits discussed, surgical consent, monitors and equipment checked, pre-op evaluation and timeout performed Spinal Block Patient position: sitting Prep: DuraPrep and site prepped and draped Patient monitoring: heart rate, cardiac monitor, continuous pulse ox and blood pressure Approach: midline Location: L3-4 Injection technique: single-shot Needle Needle type: Pencan  Needle gauge: 24 G Needle length: 9 cm Needle insertion depth: 7 cm Assessment Sensory level: T6 Events: CSF return Additional Notes Patient tolerated procedure well. Adequate sensory level.

## 2022-12-29 NOTE — Evaluation (Signed)
Physical Therapy Evaluation Patient Details Name: Connor Martin [place] MRN: [id number] DOB: 1976/04/19 Today's Date: 12/29/2022  History of Present Illness  47 yo male presents to therapy s/p R THA, anterior approach on 12/29/2022 due to failure of conservative measures. Pt PMH includes but is not limited to: ADHA, anxiety, pancreatitis, chronic LBP, HDL, and OSA  Clinical Impression    Connor Martin [place] is a 47 y.o. male POD 0 s/p R THA, AA. Patient reports IND with mobility at baseline. Patient is now limited by functional impairments (see PT problem list below) and requires S for bed mobility and min guard and cues for transfers. Patient was able to ambulate 100 feet with RW and min guard level of assist. Patient instructed in exercise to facilitate ROM and circulation to manage edema. Pt reports 8/10 pain at rest and t/o intervention, cues for  safety and coordinated breathing. Pt left seated in recliner, 2 CP on R hip and all needs met. Mother present t/o evaluation and nurse aware of pt pain report.  Patient will benefit from continued skilled PT interventions to address impairments and progress towards PLOF. Acute PT will follow to progress mobility and stair training in preparation for safe discharge home to mother's house with TOC reporting pt will have HH services.      Recommendations for follow up therapy are one component of a multi-disciplinary discharge planning process, led by the attending physician.  Recommendations may be updated based on patient status, additional functional criteria and insurance authorization.  Follow Up Recommendations       Assistance Recommended at Discharge Intermittent Supervision/Assistance  Patient can return home with the following  A little help with walking and/or transfers;A little help with bathing/dressing/bathroom;Assistance with cooking/housework;Assist for transportation;Help with stairs or ramp for entrance    Equipment Recommendations Rolling  walker (2 wheels) (provided at eval)  Recommendations for Other Services       Functional Status Assessment Patient has had a recent decline in their functional status and demonstrates the ability to make significant improvements in function in a reasonable and predictable amount of time.     Precautions / Restrictions Precautions Precautions: Fall Restrictions Weight Bearing Restrictions: No      Mobility  Bed Mobility Overal bed mobility: Needs Assistance Bed Mobility: Supine to Sit     Supine to sit: Supervision, HOB elevated          Transfers Overall transfer level: Needs assistance Equipment used: Rolling walker (2 wheels) Transfers: Sit to/from Stand Sit to Stand: Min guard           General transfer comment: cues for proper UE placement    Ambulation/Gait Ambulation/Gait assistance: Min guard Gait Distance (Feet): 100 Feet Assistive device: Rolling walker (2 wheels) Gait Pattern/deviations: Step-to pattern, Antalgic       General Gait Details: step almost through pattern with good Martin LE foot clearance and reciprocal pattern, cues for pt to decrease gait velosity  Stairs            Wheelchair Mobility    Modified Rankin (Stroke Patients Only)       Balance Overall balance assessment: Needs assistance Sitting-balance support: Feet supported Sitting balance-Leahy Scale: Good     Standing balance support: Bilateral upper extremity supported, During functional activity Standing balance-Leahy Scale: Poor                               Pertinent Vitals/Pain Pain Assessment  Pain Assessment: 0-10 Pain Score: 8  Pain Location: R hip' Pain Descriptors / Indicators: Throbbing, Burning, Constant, Guarding, Discomfort, Spasm, Stabbing Pain Intervention(s): Limited activity within patient's tolerance, Monitored during session, Premedicated before session, Repositioned, Ice applied    Home Living Family/patient expects to be  discharged to:: Private residence Living Arrangements: Parent (pt will transition to mother's home s/p PT d/c) Available Help at Discharge: Family Type of Home: House Home Access: Ramped entrance       Home Layout: Two level;Able to live on main level with bedroom/bathroom Home Equipment: [place] [employer]ane - single point      Prior Function Prior Level of Function : Independent/Modified Independent;Working/employed;Driving             Mobility Comments: IND with all ADLs, self care tasks , IADLs and driving       Hand Dominance        Extremity/Trunk Assessment        Lower Extremity Assessment Lower Extremity Assessment: RLE deficits/detail RLE Deficits / Details: ankle DF/PF 5/5 RLE Sensation: WNL    Cervical / Trunk Assessment Cervical / Trunk Assessment: Normal  Communication   Communication: No difficulties  Cognition Arousal/Alertness: Awake/alert Behavior During Therapy: WFL for tasks assessed/performed Overall Cognitive Status: Within Functional Limits for tasks assessed                                          General Comments      Exercises Total Joint Exercises Ankle Circles/Pumps: AROM, Both, 20 reps   Assessment/Plan    PT Assessment Patient needs continued PT services  PT Problem List Decreased strength;Decreased range of motion;Decreased activity tolerance;Decreased balance;Decreased mobility;Decreased coordination;Decreased knowledge of use of DME;Pain       PT Treatment Interventions      PT Goals (Current goals can be found in the Care Plan section)  Acute Rehab PT Goals Patient Stated Goal: to be able to look after my 6219th month old and to be able to transition to working with a Systems analystpersonal trainer PT Goal Formulation: With patient Time For Goal Achievement: 01/12/23 Potential to Achieve Goals: Good    Frequency 7X/week     Co-evaluation               AM-PAC PT "6 Clicks" Mobility  Outcome Measure Help needed  turning from your back to your side while in a flat bed without using bedrails?: None Help needed moving from lying on your back to sitting on the side of a flat bed without using bedrails?: None Help needed moving to and from a bed to a chair (including a wheelchair)?: A Little Help needed standing up from a chair using your arms (e.g., wheelchair or bedside chair)?: A Little Help needed to walk in hospital room?: A Little Help needed climbing 3-5 steps with a railing? : A Lot 6 Click Score: 19    End of Session Equipment Utilized During Treatment: Gait belt Activity Tolerance: No increased pain;Patient tolerated treatment well Patient left: in chair;with call bell/phone within reach;with chair alarm set Nurse Communication: Mobility status;Patient requests pain meds PT Visit Diagnosis: Unsteadiness on feet (R26.81);Other abnormalities of gait and mobility (R26.89);Muscle weakness (generalized) (M62.81);Difficulty in walking, not elsewhere classified (R26.2);Pain Pain - Right/Left: Right Pain - part of body: Hip    Time: [id number] PT Time Calculation (min) (ACUTE ONLY): 33 min   Charges:   PT Evaluation $  PT Eval Low Complexity: 1 Low PT Treatments $Gait Training: 8-22 mins        Rica Mote, PT   Jacqualyn Posey 12/29/2022, 3:14 PM

## 2022-12-29 NOTE — Op Note (Signed)
Operative Note  Date of operation: 12/29/2022 Preoperative diagnosis: Right hip avascular necrosis Postoperative diagnosis: Same  Procedure: Right direct anterior total hip arthroplasty  Implants: Implant Name Type Inv. Item Serial No. Manufacturer Lot No. LRB No. Used Action  CUP SECTOR GRIPTON 58MM - [id number] Orthopedic Implant CUP SECTOR GRIPTON 58MM  DEPU[employer] [id number] Right 1 Implanted  LINER NEUTRAL 36X58 PLUS4 - [id number]  LINER NEUTRAL 36X58 PLUS4  DEPUY ORTHOPAEDICS [id number] Right 1 Implanted  STEM FEMORAL SZ5 HIGH ACTIS - [id number] Stem STEM FEMORAL SZ5 HIGH ACTIS  DEPUY ORTHOPAEDICS [id number] Right 1 Implanted  HEAD CERAMIC 36 PLUS5 - [id number] Hips HEAD CERAMIC 36 PLUS5  DEPUY ORTHOPAEDICS [id number] Right 1 Implanted   Surgeon: Vanita Pandahristopher Y. 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  This has been confirmed on MRI findings showing severe disease in both hips.  His right hip is much more painful than the left hip currently and we have recommended a direct anterior total hip arthroplasty.  We did discuss the risk of acute blood loss anemia, nerve or vessel injury, fracture, infection, dislocation, DVT, leg length differences and implant failure as well as wound healing issues.  We spoke about her goals being decreased pain, improve mobility and overall proved quality of life.  Again he has quite significant and severe AVN of both hips with the right worse than left.  Procedure description: After informed consent obtained appropriate right hip was marked, the patient was brought to the operating room and set up on the stretcher where spinal anesthesia was obtained.  He was then laid in a supine position on the stretcher and a Foley catheter was placed.  Traction  boots were placed on both his feet and he was placed supine on the Hana fracture table with a perineal post in place in both legs and inline skeletal traction devices but no traction applied.  We assessed his pelvis and hips radiographically and then the right operative hip was prepped and draped with DuraPrep and sterile drapes.  A timeout was called and he was identified as correct patient the correct right hip.  An incision was then made just inferior and posterior to the ASIS and carried slightly obliquely down the leg.  Dissection was carried down to the tensor fascia lata muscle and the tensor fascia was then divided longitudinally to proceed with a direct interposed the hip.  Circumflex vessels were identified cauterized and hip capsule identified and opened up in L-type format finding a large joint effusion.  Cobra retractors were placed around the medial and lateral femoral neck and a femoral neck cut was made with an oscillating saw just proximal to the lesser trochanter and this was completed with an osteotome.  A corkscrew guide was placed in the femoral head and the femoral head was removed in its entirety.  The top of the femoral head flaked off easily consistent with his severe avascular process.  A bent Hohmann was then placed over the medial acetabular rim and remnants of the asked her labrum and other debris removed.  Reaming was then initiated from a size 43 reamer and stepwise increments going all the way up to a size 57 reamer with all reamers placed under direct visualization and the last reamer was placed under direct fluoroscopy in order to obtain the depth of reaming,  the inclination and the anteversion.  We then placed the real DePuy sector GRIPTION acetabular and a size 48 and went with a 36+4 polythene liner.  Attention was then turned to the femur.  With the right leg externally rotated to 120 degrees, extended and adducted, a Mueller retractor was placed medially and a Hohmann retractor  behind the greater trochanter.  The lateral joint capsule was released and a box cutting osteotome was used to enter the femoral canal.  Broaching was then initiated from a size 0 broach going up to a size 5 broach.  We then trialed a high offset femoral neck and a 36+1.5 trial hip ball.  This reduced in the pelvis and we felt like we need a little more leg length.  We dislocated the hip and remove the trial components.  We placed the real Actis femoral component size 5 with high offset and the real 36+5 ceramic hip bone to reduce this in the acetabulum and we are pleased with leg length, offset, range of motion and stability assessed mechanically and radiographically.  The soft tissue was then irrigated with normal saline solution.  The joint capsule was closed with interrupted #1 Ethibond suture followed by a #1 Vicryl to close the tensor fascia.  0 Vicryl's was used to close the deep tissue and 2-0 Vicryl was used to close the subcutaneous tissue.  The skin was closed with staples.  An Aquacel dressing was applied.  The patient was taken off the operating table and taken to recovery in stable addition.  Rexene Edison, PA-C did assist in the entire case and beginning to end and his assistance was medically necessary and crucial for soft tissue management and retraction, helping guide implant placement and a layered closure of the wound.

## 2022-12-29 NOTE — TOC Transition Note (Signed)
Transition of Care [facility]) - CM/SW Discharge Note  Patient Details  Name: Connor Martin MRN: [id number] Date of Birth: 04/29/1976  [facility] CM/SW Contact:  Ewing Schlein, LCSW Phone Number: 12/29/2022, 3:02 PM  Clinical Narrative: Patient is expected to discharge home after passing with PT. CSW met with patient and mother to confirm discharge plan and needs. Patient will need a rolling walker, which was ordered through [facility] with [employer]. Patient's note indicates HHPT and CSW followed up with [facility], Cordelia Pen, for Dr. Eliberto Ivory office. Cordelia Pen confirmed patient is set up with [facility]. CSW updated patient regarding HHPT and confirmed rolling walker has been delivered to patient's room. TOC signing off.  Final next level of care: Home w Home Health Services Barriers to Discharge: No Barriers Identified  Patient Goals and CMS Choice CMS Medicare.gov Compare Post Acute Care list provided to:: Patient Choice offered to / list presented to : Patient  Discharge Plan and Services Additional resources added to the After Visit Summary for          DME Arranged: Walker rolling DME Agency: [employer] Date DME Agency Contacted: 12/29/22 Representative spoke with at DME Agency: Belenda Cruise HH Arranged: PT HH Agency: [facility] Representative spoke with at [facility] Agency: Prearranged in orthopedist's office  Social Determinants of Health (SDOH) Interventions SDOH Screenings   Food Insecurity: No Food Insecurity (12/29/2022)  Housing: Low Risk  (12/29/2022)  Transportation Needs: No Transportation Needs (12/29/2022)  Utilities: Not At Risk (12/29/2022)  Tobacco Use: Low Risk  (12/29/2022)   Readmission Risk Interventions     No data to display

## 2022-12-29 NOTE — Transfer of Care (Signed)
Immediate Anesthesia Transfer of Care Note  Patient: Connor Martin  Procedure(s) Performed: RIGHT TOTAL HIP ARTHROPLASTY ANTERIOR APPROACH (Right: Hip)  Patient Location: PACU  Anesthesia Type:Spinal  Level of Consciousness: awake, alert , and oriented  Airway & Oxygen Therapy: Patient Spontanous Breathing and Patient connected to face mask oxygen  Post-op Assessment: Report given to RN and Post -op Vital signs reviewed and stable  Post vital signs: Reviewed and stable  Last Vitals:  Vitals Value Taken Time  BP 111/73 12/29/22 1003  Temp    Pulse 99 12/29/22 1004  Resp 15 12/29/22 1004  SpO2 100 % 12/29/22 1004  Vitals shown include unvalidated device data.  Last Pain:  Vitals:   12/29/22 0721  TempSrc:   PainSc: 0-No pain         Complications: No notable events documented.

## 2022-12-29 NOTE — Interval H&P Note (Signed)
History and Physical Interval Note: The patient understands that he is here today for right hip replacement to treat his right hip avascular necrosis.  There is been no acute or interval change in his medical status.  See H&P.  The risk and benefits of surgery been explained in detail and informed consent was obtained.  The right operative hip has been marked.  12/29/2022 7:07 AM  Connor Martin  has presented today for surgery, with the diagnosis of avascular necrosis right hip.  The various methods of treatment have been discussed with the patient and family. After consideration of risks, benefits and other options for treatment, the patient has consented to  Procedure(s): RIGHT TOTAL HIP ARTHROPLASTY ANTERIOR APPROACH (Right) as a surgical intervention.  The patient's history has been reviewed, patient examined, no change in status, stable for surgery.  I have reviewed the patient's chart and labs.  Questions were answered to the patient's satisfaction.     Kathryne Hitch

## 2022-12-29 NOTE — Anesthesia Preprocedure Evaluation (Addendum)
Anesthesia Evaluation  Patient identified by MRN, date of birth, ID band Patient awake    Reviewed: Allergy & Precautions, NPO status , Patient's Chart, lab work & pertinent test results  Airway Mallampati: II  TM Distance: >3 FB Neck ROM: Full    Dental no notable dental hx. (+) Teeth Intact, Dental Advisory Given   Pulmonary sleep apnea and Continuous Positive Airway Pressure Ventilation , pneumonia, resolved   Pulmonary exam normal breath sounds clear to auscultation       Cardiovascular negative cardio ROS Normal cardiovascular exam Rhythm:Regular Rate:Normal     Neuro/Psych  Headaches PSYCHIATRIC DISORDERS Anxiety Depression    ADD  Neuromuscular disease    GI/Hepatic Neg liver ROS,GERD  Medicated,,Hx/o pancreatitis   Endo/Other  HYperlipidemia  Renal/GU negative Renal ROS  negative genitourinary   Musculoskeletal Right hip OA   Abdominal   Peds  Hematology  (+) Blood dyscrasia, anemia Thrombocytopenia   Anesthesia Other Findings   Reproductive/Obstetrics                             Anesthesia Physical Anesthesia Plan  ASA: 3  Anesthesia Plan: Spinal   Post-op Pain Management:    Induction: Intravenous  PONV Risk Score and Plan: 2 and Treatment may vary due to age or medical condition, Midazolam, Propofol infusion and Ondansetron  Airway Management Planned: Natural Airway and Simple Face Mask  Additional Equipment: None  Intra-op Plan:   Post-operative Plan:   Informed Consent: I have reviewed the patients History and Physical, chart, labs and discussed the procedure including the risks, benefits and alternatives for the proposed anesthesia with the patient or authorized representative who has indicated his/her understanding and acceptance.     Dental advisory given  Plan Discussed with: CRNA and Anesthesiologist  Anesthesia Plan Comments:         Anesthesia  Quick Evaluation

## 2022-12-29 NOTE — Plan of Care (Signed)

## 2022-12-29 NOTE — Anesthesia Postprocedure Evaluation (Signed)
Anesthesia Post Note  Patient: Connor Martin  Procedure(s) Performed: RIGHT TOTAL HIP ARTHROPLASTY ANTERIOR APPROACH (Right: Hip)     Patient location during evaluation: PACU Anesthesia Type: Spinal Level of consciousness: oriented and awake and alert Pain management: pain level controlled Vital Signs Assessment: post-procedure vital signs reviewed and stable Respiratory status: spontaneous breathing, respiratory function stable and nonlabored ventilation Cardiovascular status: blood pressure returned to baseline and stable Postop Assessment: no headache, no backache, no apparent nausea or vomiting and spinal receding Anesthetic complications: no   No notable events documented.  Last Vitals:  Vitals:   12/29/22 1100 12/29/22 1115  BP: 128/80 (!) 131/96  Pulse: 65 68  Resp: 16 16  Temp: 36.6 C   SpO2: 100% 98%    Last Pain:  Vitals:   12/29/22 1115  TempSrc:   PainSc: 0-No pain                 Bradyn Vassey A.

## 2022-12-30 ENCOUNTER — Other Ambulatory Visit (HOSPITAL_BASED_OUTPATIENT_CLINIC_OR_DEPARTMENT_OTHER): Payer: Self-pay

## 2022-12-30 DIAGNOSIS — M87851 Other osteonecrosis, right femur: Secondary | ICD-10-CM | POA: Diagnosis not present

## 2022-12-30 DIAGNOSIS — Z91040 Latex allergy status: Secondary | ICD-10-CM | POA: Diagnosis not present

## 2022-12-30 DIAGNOSIS — Z79899 Other long term (current) drug therapy: Secondary | ICD-10-CM | POA: Diagnosis not present

## 2022-12-30 LAB — CBC
HCT: 33.1 % — ABNORMAL LOW (ref 39.0–52.0)
Hemoglobin: 11.3 g/dL — ABNORMAL LOW (ref 13.0–17.0)
MCH: 32.8 pg (ref 26.0–34.0)
MCHC: 34.1 g/dL (ref 30.0–36.0)
MCV: 95.9 fL (ref 80.0–100.0)
Platelets: 113 K/uL — ABNORMAL LOW (ref 150–400)
RBC: 3.45 MIL/uL — ABNORMAL LOW (ref 4.22–5.81)
RDW: 11.6 % (ref 11.5–15.5)
WBC: 9.8 K/uL (ref 4.0–10.5)
nRBC: 0 % (ref 0.0–0.2)

## 2022-12-30 LAB — BASIC METABOLIC PANEL
Anion gap: 8 (ref 5–15)
BUN: 11 mg/dL (ref 6–20)
CO2: 23 mmol/L (ref 22–32)
Calcium: 8.6 mg/dL — ABNORMAL LOW (ref 8.9–10.3)
Chloride: 105 mmol/L (ref 98–111)
Creatinine, Ser: 0.89 mg/dL (ref 0.61–1.24)
GFR, Estimated: >60 mL/min (ref >60)
Glucose, Bld: 179 mg/dL — ABNORMAL HIGH (ref 70–99)
Potassium: 3.8 mmol/L (ref 3.5–5.1)
Sodium: 136 mmol/L (ref 135–145)

## 2022-12-30 MED ORDER — ASPIRIN 81 MG PO CHEW
81.0000 mg | CHEWABLE_TABLET | Freq: Two times a day (BID) | ORAL | 0 refills | Status: DC
  Filled 2022-12-30: qty 90, 45d supply, fill #0

## 2022-12-30 MED ORDER — KETOROLAC TROMETHAMINE 15 MG/ML IJ SOLN
7.5000 mg | Freq: Four times a day (QID) | INTRAMUSCULAR | Status: DC
  Administered 2022-12-30 (×2): 7.5 mg via INTRAVENOUS
  Filled 2022-12-30 (×2): qty 1

## 2022-12-30 MED ORDER — GABAPENTIN 100 MG PO CAPS
100.0000 mg | ORAL_CAPSULE | Freq: Three times a day (TID) | ORAL | 0 refills | Status: DC | PRN
  Filled 2022-12-30: qty 60, 20d supply, fill #0

## 2022-12-30 MED ORDER — TIZANIDINE HCL 4 MG PO TABS
4.0000 mg | ORAL_TABLET | Freq: Four times a day (QID) | ORAL | 0 refills | Status: DC | PRN
  Filled 2022-12-30: qty 30, 8d supply, fill #0

## 2022-12-30 MED ORDER — HYDROMORPHONE HCL 4 MG PO TABS
4.0000 mg | ORAL_TABLET | ORAL | 0 refills | Status: DC | PRN
  Filled 2022-12-30: qty 30, 5d supply, fill #0

## 2022-12-30 MED ORDER — OXYCODONE HCL 5 MG PO TABS
5.0000 mg | ORAL_TABLET | Freq: Once | ORAL | Status: AC
  Administered 2022-12-30: 5 mg via ORAL
  Filled 2022-12-30: qty 1

## 2022-12-30 NOTE — Discharge Summary (Signed)
Patient ID: Connor Martin MRN: [id number] DOB/AGE: 1975-12-18 46 y.o.  Admit date: 12/29/2022 Discharge date: 12/30/2022  Admission Diagnoses:  Principal Problem:   Avascular necrosis of bone of right hip Active Problems:   Status post total replacement of right hip   Discharge Diagnoses:  Same  Past Medical History:  Diagnosis Date   Acute pancreatitis hospitalized 04/06/2016   Acute sinusitis    ADD (attention deficit disorder)    Anxiety    Blood in stool    Bronchitis    Chronic lower back pain    Depression    Dyslipidemia    Family history of colon cancer    Fatty liver    GERD (gastroesophageal reflux disease)    Headache    "@ least once/week" (04/07/2016)   Hypertriglyceridemia    Hypertriglyceridemia    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    Migraine    "q other month" (04/07/2016)   Nasal congestion    Palpitations    Pancreatitis    Panic attacks    Pneumonia ~ 1981   Sleep apnea    Snoring     Surgeries: Procedure(s): RIGHT TOTAL HIP ARTHROPLASTY ANTERIOR APPROACH on 12/29/2022   Consultants:   Discharged Condition: Improved  Hospital Course: Connor Martin is an 47 y.o. male who was admitted 12/29/2022 for operative treatment ofAvascular necrosis of bone of right hip. Patient has severe unremitting pain that affects sleep, daily activities, and work/hobbies. After pre-op clearance the patient was taken to the operating room on 12/29/2022 and underwent  Procedure(s): RIGHT TOTAL HIP ARTHROPLASTY ANTERIOR APPROACH.    Patient was given perioperative antibiotics:  Anti-infectives (From admission, onward)    Start     Dose/Rate Route Frequency Ordered Stop   12/29/22 2000  vancomycin (VANCOCIN) IVPB 1000 mg/200 mL premix        1,000 mg 200 mL/hr over 60 Minutes Intravenous Every 12 hours 12/29/22 1150 12/29/22 2203   12/29/22 0645  vancomycin (VANCOREADY) IVPB 1500 mg/300 mL        1,500 mg 150 mL/hr over 120 Minutes Intravenous On call to  O.R. 12/29/22 [id number] 12/29/22 1507   12/29/22 0645  ceFAZolin (ANCEF) IVPB 2g/100 mL premix        2 g 200 mL/hr over 30 Minutes Intravenous On call to O.R. 12/29/22 [id number] 12/29/22 0847        Patient was given sequential compression devices, early ambulation, and chemoprophylaxis to prevent DVT.  Patient benefited maximally from hospital stay and there were no complications.    Recent vital signs: Patient Vitals for the past 24 hrs:  BP Temp Temp src Pulse Resp SpO2  12/30/22 0926 129/85 98.8 F (37.1 C) -- 84 20 96 %  12/30/22 0548 138/82 98.1 F (36.7 C) Oral 92 18 100 %  12/30/22 0221 (!) 144/83 98.2 F (36.8 C) Oral 97 16 98 %  12/29/22 2152 (!) 149/90 98.2 F (36.8 C) Oral 86 16 100 %  12/29/22 1757 (!) 154/91 98.1 F (36.7 C) Oral 83 18 98 %  12/29/22 1350 (!) 136/93 98.3 F (36.8 C) Oral 87 18 98 %  12/29/22 1150 127/78 98.1 F (36.7 C) -- 68 -- 100 %  12/29/22 1130 135/77 -- -- 61 12 100 %  12/29/22 1115 (!) 131/96 -- -- 68 16 98 %  12/29/22 1100 128/80 97.9 F (36.6 C) -- 65 16 100 %  12/29/22 1045 125/79 -- -- 60 11 100 %  12/29/22 1030 118/71 -- --  70 12 99 %  12/29/22 1015 114/68 -- -- 84 18 99 %  12/29/22 1003 111/73 98.1 F (36.7 C) -- 95 12 100 %     Recent laboratory studies:  Recent Labs    12/29/22 0715 12/30/22 0405  WBC 5.0 9.8  HGB 12.9* 11.3*  HCT 38.1* 33.1*  PLT 116* 113*  NA  --  136  K  --  3.8  CL  --  105  CO2  --  23  BUN  --  11  CREATININE  --  0.89  GLUCOSE  --  179*  CALCIUM  --  8.6*     Discharge Medications:   Allergies as of 12/30/2022       Reactions   Imitrex [sumatriptan] Swelling   Brain   Latex Other (See Comments)   Does not prefer   Benadryl [diphenhydramine] Hives, Palpitations   Unsure, SOB        Medication List     STOP taking these medications    levofloxacin 750 MG tablet Commonly known as: Levaquin   oxyCODONE-acetaminophen 7.5-325 MG tablet Commonly known as: PERCOCET       TAKE  these medications    albuterol 108 (90 Base) MCG/ACT inhaler Commonly known as: VENTOLIN HFA Inhale 2 puffs into the lungs every 4 (four) hours as needed for difficulty breath.   ALPRAZolam 0.5 MG tablet Commonly known as: Xanax Take 1 tablet by mouth 2 times every day as needed.   amphetamine-dextroamphetamine 20 MG tablet Commonly known as: Adderall Take 1 tablet (20 mg total) by mouth 3 (three) times daily.   aspirin 81 MG chewable tablet Chew 1 tablet (81 mg total) by mouth 2 (two) times daily.   betamethasone dipropionate 0.05 % ointment Commonly known as: DIPROLENE Apply a thin layer to affected area(s) twice daily. What changed: Another medication with the same name was removed. Continue taking this medication, and follow the directions you see here.   Biotin 1 MG Caps Take 1 mg by mouth daily at 12 noon.   esomeprazole 40 MG capsule Commonly known as: NexIUM Take 1 capsule by mouth every day. (take 1 capsule by oral route  every day)   fenofibrate 160 MG tablet TAKE 1 TABLET BY MOUTH EVERY DAY   fluticasone 110 MCG/ACT inhaler Commonly known as: Flovent HFA Inhale 2 puffs into the lungs in the morning and at bedtime.   fluticasone 50 MCG/ACT nasal spray Commonly known as: FLONASE Place 2 sprays into both nostrils daily.   gabapentin 100 MG capsule Commonly known as: NEURONTIN Take 1 capsule (100 mg total) by mouth 3 (three) times daily as needed (for burning pain).   HYDROmorphone 4 MG tablet Commonly known as: DILAUDID Take 1 tablet (4 mg total) by mouth every 4 (four) hours as needed for severe pain (pain score 7-10).   imiquimod 5 % cream Commonly known as: ALDARA Apply topically 5 nights per week, use occlusive dressing, wash off after 8-10 hours.   levocetirizine 5 MG tablet Commonly known as: XYZAL Take 1 tablet (5 mg total) by mouth every evening.   magnesium oxide 400 MG tablet Commonly known as: MAG-OX Take 400 mg by mouth daily.   Milk  Thistle 200 MG Caps Take 200 mg by mouth daily.   multivitamin with minerals Tabs tablet Take 1 tablet by mouth daily.   Nurtec 75 MG Tbdp Generic drug: Rimegepant Sulfate Take 1 tablet by mouth as needed. (Take 75 mg by mouth as needed.)  omega-3 acid ethyl esters 1 g capsule Commonly known as: Lovaza Take 2 capsule by mouth 2 times daily (take 2 capsule by oral route 2 times every day)   tiZANidine 4 MG tablet Commonly known as: Zanaflex Take 1 tablet (4 mg total) by mouth every 6 (six) hours as needed for muscle spasms.   vitamin E 1000 UNIT capsule Take 1,000 Units by mouth daily.   Zinc Gluconate 100 MG Tabs Take 100 mg by mouth daily.               Durable Medical Equipment  (From admission, onward)           Start     Ordered   12/29/22 1151  DME 3 n 1  Once        12/29/22 1150   12/29/22 1151  DME Walker rolling  Once       Question Answer Comment  Walker: With 5 Inch Wheels   Patient needs a walker to treat with the following condition Status post total replacement of right hip      12/29/22 1150            Diagnostic Studies: DG Pelvis Portable  Result Date: 12/29/2022 CLINICAL DATA:  Status post total right hip arthroplasty. EXAM: PORTABLE PELVIS 1-2 VIEWS COMPARISON:  AP pelvis 10/11/2022 FINDINGS: Interval total right hip arthroplasty. No perihardware lucency is seen to indicate hardware failure or loosening. Expected postoperative lateral hip subcutaneous air and surgical skin staples. Mild superomedial left femoroacetabular joint space narrowing. Unchanged small ossicle at the lateral aspect of the superior left acetabulum. The bilateral sacroiliac joint spaces are maintained. Mild pubic symphysis joint space narrowing. No acute fracture or dislocation. IMPRESSION: Interval total right hip arthroplasty without evidence of hardware failure. Electronically Signed   By: Neita Garnet M.D.   On: 12/29/2022 11:13   DG HIP UNILAT WITH PELVIS 1V  RIGHT  Result Date: 12/29/2022 CLINICAL DATA:  Status post total right hip arthroplasty. Intraoperative fluoroscopy. EXAM: DG HIP (WITH OR WITHOUT PELVIS) 1V RIGHT COMPARISON:  AP pelvis 10/11/2022 FINDINGS: Images were performed intraoperatively without the presence of a radiologist. Interval total right hip arthroplasty. No hardware complication is seen. Total fluoroscopy images: 3 Total fluoroscopy time: 15 seconds Total dose: Radiation Exposure Index (as provided by the fluoroscopic device): 2.8 mGy air Kerma Please see intraoperative findings for further detail. IMPRESSION: Intraoperative fluoroscopy provided for total right hip arthroplasty. Electronically Signed   By: Neita Garnet M.D.   On: 12/29/2022 10:00   DG C-Arm 1-60 Min-No Report  Result Date: 12/29/2022 Fluoroscopy was utilized by the requesting physician.  No radiographic interpretation.    Disposition: Discharge disposition: 01-Home or Self Care          Follow-up Information     [facility] Follow up.   Why: Iantha Fallen will provide PT in the home after discharge.        Kathryne Hitch, MD Follow up in 2 week(s).   Specialty: Orthopedic Surgery Contact information: [address] [place] [address] [phone]                  Signed: Kathryne Hitch 12/30/2022, 10:00 AM

## 2022-12-30 NOTE — Progress Notes (Signed)
Subjective: 1 Day Post-Op Procedure(s) (LRB): RIGHT TOTAL HIP ARTHROPLASTY ANTERIOR APPROACH (Right) Patient reports pain as moderate.    Objective: Vital signs in last 24 hours: Temp:  [97.9 F (36.6 C)-98.8 F (37.1 C)] 98.8 F (37.1 C) (04/06 0926) Pulse Rate:  [60-97] 84 (04/06 0926) Resp:  [11-20] 20 (04/06 0926) BP: (111-154)/(68-96) 129/85 (04/06 0926) SpO2:  [96 %-100 %] 96 % (04/06 0926)  Intake/Output from previous day: 04/05 0701 - 04/06 0700 In: 3036.3 [P.O.:840; I.V.:1996.3; IV Piggyback:200] Out: 3600 [Urine:3450; Blood:150] Intake/Output this shift: Total I/O In: -  Out: 250 [Urine:250]  Recent Labs    12/29/22 0715 12/30/22 0405  HGB 12.9* 11.3*   Recent Labs    12/29/22 0715 12/30/22 0405  WBC 5.0 9.8  RBC 4.03* 3.45*  HCT 38.1* 33.1*  PLT 116* 113*   Recent Labs    12/30/22 0405  NA 136  K 3.8  CL 105  CO2 23  BUN 11  CREATININE 0.89  GLUCOSE 179*  CALCIUM 8.6*   No results for input(s): "LABPT", "INR" in the last 72 hours.  Sensation intact distally Intact pulses distally Dorsiflexion/Plantar flexion intact Incision: dressing C/D/I   Assessment/Plan: 1 Day Post-Op Procedure(s) (LRB): RIGHT TOTAL HIP ARTHROPLASTY ANTERIOR APPROACH (Right) Up with therapy Discharge home with home health      Kathryne Hitch 12/30/2022, 9:56 AM

## 2022-12-30 NOTE — Progress Notes (Signed)
Physical Therapy Treatment Patient Details Name: Connor Martin MRN: [id number] DOB: 09/24/1976 Today's Date: 12/30/2022   History of Present Illness 47 yo male presents to therapy s/p R THA, anterior approach on 12/29/2022 due to failure of conservative measures. Pt PMH includes but is not limited to: ADHA, anxiety, pancreatitis, chronic LBP, HDL, and OSA    PT Comments    Progressing with mobility. Reviewed/practiced exercises, gait training, and stair training. Encouraged pt to ambulate often at home, as tolerated. All PT education completed.    Recommendations for follow up therapy are one component of a multi-disciplinary discharge planning process, led by the attending physician.  Recommendations may be updated based on patient status, additional functional criteria and insurance authorization.  Follow Up Recommendations       Assistance Recommended at Discharge Intermittent Supervision/Assistance  Patient can return home with the following A little help with walking and/or transfers;A little help with bathing/dressing/bathroom;Assistance with cooking/housework;Assist for transportation;Help with stairs or ramp for entrance   Equipment Recommendations       Recommendations for Other Services       Precautions / Restrictions Precautions Precautions: Fall Restrictions Weight Bearing Restrictions: No Other Position/Activity Restrictions: WBAT     Mobility  Bed Mobility Overal bed mobility: Needs Assistance Bed Mobility: Supine to Sit     Supine to sit: Supervision, HOB elevated          Transfers Overall transfer level: Needs assistance Equipment used: Rolling walker (2 wheels) Transfers: Sit to/from Stand Sit to Stand: Supervision                Ambulation/Gait Ambulation/Gait assistance: Supervision Gait Distance (Feet): 175 Feet Assistive device: Rolling walker (2 wheels) Gait Pattern/deviations: Step-through pattern, Decreased stride length        General Gait Details: Supv for safety.   Stairs             Wheelchair Mobility    Modified Rankin (Stroke Patients Only)       Balance Overall balance assessment: Needs assistance         Standing balance support: Bilateral upper extremity supported, During functional activity Standing balance-Leahy Scale: Fair                              Cognition Arousal/Alertness: Awake/alert Behavior During Therapy: WFL for tasks assessed/performed Overall Cognitive Status: Within Functional Limits for tasks assessed                                          Exercises Total Joint Exercises Ankle Circles/Pumps: AROM, Both, 15 reps Quad Sets: AROM, Both, 15 reps Heel Slides: AROM, Right, 15 reps Hip ABduction/ADduction: AROM, Right, 15 reps Knee Flexion: AROM, Right, 15 reps, Standing    General Comments        Pertinent Vitals/Pain Pain Assessment Pain Assessment: 0-10 Pain Score: 5  Pain Location: R groin/thigh Pain Descriptors / Indicators: Burning, Guarding, Discomfort, Spasm Pain Intervention(s): Monitored during session, Repositioned    Home Living                          Prior Function            PT Goals (current goals can now be found in the care plan section) Progress towards PT goals: Progressing toward goals  Frequency    7X/week      PT Plan Current plan remains appropriate    Co-evaluation              AM-PAC PT "6 Clicks" Mobility   Outcome Measure  Help needed turning from your back to your side while in a flat bed without using bedrails?: None Help needed moving from lying on your back to sitting on the side of a flat bed without using bedrails?: None Help needed moving to and from a bed to a chair (including a wheelchair)?: A Little Help needed standing up from a chair using your arms (e.g., wheelchair or bedside chair)?: A Little Help needed to walk in hospital room?: A  Little Help needed climbing 3-5 steps with a railing? : A Little 6 Click Score: 20    End of Session Equipment Utilized During Treatment: Gait belt Activity Tolerance: Patient tolerated treatment well Patient left: in chair;with call bell/phone within reach   PT Visit Diagnosis: Other abnormalities of gait and mobility (R26.89);Difficulty in walking, not elsewhere classified (R26.2);Pain Pain - Right/Left: Right Pain - part of body: Hip     Time: 0934-1000 PT Time Calculation (min) (ACUTE ONLY): 26 min  Charges:  $Gait Training: 8-22 mins $Therapeutic Exercise: 8-22 mins                         Faye RamsayJannie P, PT Acute Rehabilitation  Office: [phone]

## 2022-12-30 NOTE — Discharge Instructions (Signed)

## 2022-12-30 NOTE — Progress Notes (Signed)
Pt alert and oriented, surgical dressing clean, dry and intact. Pt has no any questions or concerns regarding discharge instructions. Belongings sent home with pt.

## 2022-12-31 ENCOUNTER — Other Ambulatory Visit (HOSPITAL_BASED_OUTPATIENT_CLINIC_OR_DEPARTMENT_OTHER): Payer: Self-pay

## 2022-12-31 MED ORDER — OXYCODONE-ACETAMINOPHEN 7.5-325 MG PO TABS
1.0000 | ORAL_TABLET | Freq: Two times a day (BID) | ORAL | 0 refills | Status: DC | PRN
  Filled 2023-01-03: qty 60, 30d supply, fill #0

## 2023-01-01 ENCOUNTER — Encounter: Payer: Self-pay | Admitting: Orthopaedic Surgery

## 2023-01-01 ENCOUNTER — Telehealth: Payer: Self-pay | Admitting: Orthopaedic Surgery

## 2023-01-01 ENCOUNTER — Other Ambulatory Visit (HOSPITAL_BASED_OUTPATIENT_CLINIC_OR_DEPARTMENT_OTHER): Payer: Self-pay

## 2023-01-01 ENCOUNTER — Encounter (HOSPITAL_COMMUNITY): Payer: Self-pay | Admitting: Orthopaedic Surgery

## 2023-01-01 NOTE — Telephone Encounter (Signed)
Patient would like a call about getting a cane and wheelchair Rx, stated he has met his deductible . Please advise

## 2023-01-01 NOTE — Telephone Encounter (Signed)
Patient asking for an Ice machine for his hp pad. Please advise?

## 2023-01-03 ENCOUNTER — Other Ambulatory Visit (HOSPITAL_COMMUNITY): Payer: Self-pay

## 2023-01-03 ENCOUNTER — Telehealth: Payer: Self-pay | Admitting: Orthopaedic Surgery

## 2023-01-03 ENCOUNTER — Other Ambulatory Visit (HOSPITAL_BASED_OUTPATIENT_CLINIC_OR_DEPARTMENT_OTHER): Payer: Self-pay

## 2023-01-03 DIAGNOSIS — Z96641 Presence of right artificial hip joint: Secondary | ICD-10-CM | POA: Diagnosis not present

## 2023-01-03 DIAGNOSIS — K219 Gastro-esophageal reflux disease without esophagitis: Secondary | ICD-10-CM | POA: Diagnosis not present

## 2023-01-03 DIAGNOSIS — Z79891 Long term (current) use of opiate analgesic: Secondary | ICD-10-CM | POA: Diagnosis not present

## 2023-01-03 DIAGNOSIS — E785 Hyperlipidemia, unspecified: Secondary | ICD-10-CM | POA: Diagnosis not present

## 2023-01-03 DIAGNOSIS — F32A Depression, unspecified: Secondary | ICD-10-CM | POA: Diagnosis not present

## 2023-01-03 DIAGNOSIS — F411 Generalized anxiety disorder: Secondary | ICD-10-CM | POA: Diagnosis not present

## 2023-01-03 DIAGNOSIS — F900 Attention-deficit hyperactivity disorder, predominantly inattentive type: Secondary | ICD-10-CM | POA: Diagnosis not present

## 2023-01-03 DIAGNOSIS — Z7982 Long term (current) use of aspirin: Secondary | ICD-10-CM | POA: Diagnosis not present

## 2023-01-03 DIAGNOSIS — Z471 Aftercare following joint replacement surgery: Secondary | ICD-10-CM | POA: Diagnosis not present

## 2023-01-03 NOTE — Telephone Encounter (Signed)
Verbal order given  

## 2023-01-03 NOTE — Telephone Encounter (Signed)
Nynicka from [facility] needs Verbal orders for PT 2 week 1 3 week 2 [phone]

## 2023-01-05 DIAGNOSIS — F411 Generalized anxiety disorder: Secondary | ICD-10-CM | POA: Diagnosis not present

## 2023-01-05 DIAGNOSIS — F900 Attention-deficit hyperactivity disorder, predominantly inattentive type: Secondary | ICD-10-CM | POA: Diagnosis not present

## 2023-01-05 DIAGNOSIS — Z471 Aftercare following joint replacement surgery: Secondary | ICD-10-CM | POA: Diagnosis not present

## 2023-01-05 DIAGNOSIS — E785 Hyperlipidemia, unspecified: Secondary | ICD-10-CM | POA: Diagnosis not present

## 2023-01-05 DIAGNOSIS — Z7982 Long term (current) use of aspirin: Secondary | ICD-10-CM | POA: Diagnosis not present

## 2023-01-05 DIAGNOSIS — F32A Depression, unspecified: Secondary | ICD-10-CM | POA: Diagnosis not present

## 2023-01-05 DIAGNOSIS — Z96641 Presence of right artificial hip joint: Secondary | ICD-10-CM | POA: Diagnosis not present

## 2023-01-05 DIAGNOSIS — K219 Gastro-esophageal reflux disease without esophagitis: Secondary | ICD-10-CM | POA: Diagnosis not present

## 2023-01-05 DIAGNOSIS — Z79891 Long term (current) use of opiate analgesic: Secondary | ICD-10-CM | POA: Diagnosis not present

## 2023-01-06 ENCOUNTER — Other Ambulatory Visit (HOSPITAL_BASED_OUTPATIENT_CLINIC_OR_DEPARTMENT_OTHER): Payer: Self-pay

## 2023-01-07 ENCOUNTER — Other Ambulatory Visit: Payer: Self-pay | Admitting: Orthopaedic Surgery

## 2023-01-08 ENCOUNTER — Other Ambulatory Visit (HOSPITAL_BASED_OUTPATIENT_CLINIC_OR_DEPARTMENT_OTHER): Payer: Self-pay

## 2023-01-08 DIAGNOSIS — Z7982 Long term (current) use of aspirin: Secondary | ICD-10-CM | POA: Diagnosis not present

## 2023-01-08 DIAGNOSIS — K219 Gastro-esophageal reflux disease without esophagitis: Secondary | ICD-10-CM | POA: Diagnosis not present

## 2023-01-08 DIAGNOSIS — F900 Attention-deficit hyperactivity disorder, predominantly inattentive type: Secondary | ICD-10-CM | POA: Diagnosis not present

## 2023-01-08 DIAGNOSIS — F32A Depression, unspecified: Secondary | ICD-10-CM | POA: Diagnosis not present

## 2023-01-08 DIAGNOSIS — Z96641 Presence of right artificial hip joint: Secondary | ICD-10-CM | POA: Diagnosis not present

## 2023-01-08 DIAGNOSIS — Z79891 Long term (current) use of opiate analgesic: Secondary | ICD-10-CM | POA: Diagnosis not present

## 2023-01-08 DIAGNOSIS — Z471 Aftercare following joint replacement surgery: Secondary | ICD-10-CM | POA: Diagnosis not present

## 2023-01-08 DIAGNOSIS — F411 Generalized anxiety disorder: Secondary | ICD-10-CM | POA: Diagnosis not present

## 2023-01-08 DIAGNOSIS — E785 Hyperlipidemia, unspecified: Secondary | ICD-10-CM | POA: Diagnosis not present

## 2023-01-08 MED ORDER — HYDROMORPHONE HCL 4 MG PO TABS
4.0000 mg | ORAL_TABLET | ORAL | 0 refills | Status: DC | PRN
  Filled 2023-01-08: qty 30, 5d supply, fill #0

## 2023-01-08 MED ORDER — TIZANIDINE HCL 4 MG PO TABS
4.0000 mg | ORAL_TABLET | Freq: Four times a day (QID) | ORAL | 0 refills | Status: DC | PRN
  Filled 2023-01-08: qty 30, 8d supply, fill #0

## 2023-01-10 ENCOUNTER — Encounter: Payer: Self-pay | Admitting: Orthopaedic Surgery

## 2023-01-10 DIAGNOSIS — F32A Depression, unspecified: Secondary | ICD-10-CM | POA: Diagnosis not present

## 2023-01-10 DIAGNOSIS — E785 Hyperlipidemia, unspecified: Secondary | ICD-10-CM | POA: Diagnosis not present

## 2023-01-10 DIAGNOSIS — Z7982 Long term (current) use of aspirin: Secondary | ICD-10-CM | POA: Diagnosis not present

## 2023-01-10 DIAGNOSIS — Z471 Aftercare following joint replacement surgery: Secondary | ICD-10-CM | POA: Diagnosis not present

## 2023-01-10 DIAGNOSIS — F900 Attention-deficit hyperactivity disorder, predominantly inattentive type: Secondary | ICD-10-CM | POA: Diagnosis not present

## 2023-01-10 DIAGNOSIS — F411 Generalized anxiety disorder: Secondary | ICD-10-CM | POA: Diagnosis not present

## 2023-01-10 DIAGNOSIS — K219 Gastro-esophageal reflux disease without esophagitis: Secondary | ICD-10-CM | POA: Diagnosis not present

## 2023-01-10 DIAGNOSIS — Z96641 Presence of right artificial hip joint: Secondary | ICD-10-CM | POA: Diagnosis not present

## 2023-01-10 DIAGNOSIS — Z79891 Long term (current) use of opiate analgesic: Secondary | ICD-10-CM | POA: Diagnosis not present

## 2023-01-11 ENCOUNTER — Ambulatory Visit (INDEPENDENT_AMBULATORY_CARE_PROVIDER_SITE_OTHER): Payer: 59 | Admitting: Orthopaedic Surgery

## 2023-01-11 ENCOUNTER — Other Ambulatory Visit (HOSPITAL_BASED_OUTPATIENT_CLINIC_OR_DEPARTMENT_OTHER): Payer: Self-pay

## 2023-01-11 DIAGNOSIS — M87052 Idiopathic aseptic necrosis of left femur: Secondary | ICD-10-CM

## 2023-01-11 DIAGNOSIS — Z96641 Presence of right artificial hip joint: Secondary | ICD-10-CM

## 2023-01-11 MED ORDER — GABAPENTIN 100 MG PO CAPS
100.0000 mg | ORAL_CAPSULE | Freq: Three times a day (TID) | ORAL | 0 refills | Status: DC | PRN
  Filled 2023-01-11: qty 60, 20d supply, fill #0

## 2023-01-11 NOTE — Progress Notes (Signed)
This is the first postoperative visit for the patient status post a right total hip arthroplasty to treat avascular necrosis.  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  He is doing well overall.  He had a lot of appropriate questions.  He is now using a cane to ambulate but using this sparingly.  He has been compliant with a baby aspirin twice daily.  He does wish to proceed with a left hip replacement sometime later in the summer.  He we answered all of his questions that he had listed which were all appropriate questions.  He can drop from my standpoint as long as he does not have a narcotic in his system.  He is breaking his Zanaflex in half and does need a refill on gabapentin which she is taking 3 times a day but just 100 mg.  His right hip incision looks good.  There is a hematoma but no seroma.  The staples are removed and Steri-Strips applied.  There is bruising on his knee to be expected and he does have some foot and ankle bruising from a twisting injury last evening but everything seems fine.  We will work on getting him scheduled for his left hip replacement for AVN later in June or July.  I will refill the gabapentin and if he does need other medicines he will let us know.  He can stop his baby aspirin as well.  Will see him back in 4 weeks to see how he is doing overall but no x-rays are needed.

## 2023-01-12 DIAGNOSIS — F411 Generalized anxiety disorder: Secondary | ICD-10-CM | POA: Diagnosis not present

## 2023-01-12 DIAGNOSIS — E785 Hyperlipidemia, unspecified: Secondary | ICD-10-CM | POA: Diagnosis not present

## 2023-01-12 DIAGNOSIS — Z79891 Long term (current) use of opiate analgesic: Secondary | ICD-10-CM | POA: Diagnosis not present

## 2023-01-12 DIAGNOSIS — F32A Depression, unspecified: Secondary | ICD-10-CM | POA: Diagnosis not present

## 2023-01-12 DIAGNOSIS — Z471 Aftercare following joint replacement surgery: Secondary | ICD-10-CM | POA: Diagnosis not present

## 2023-01-12 DIAGNOSIS — Z7982 Long term (current) use of aspirin: Secondary | ICD-10-CM | POA: Diagnosis not present

## 2023-01-12 DIAGNOSIS — F900 Attention-deficit hyperactivity disorder, predominantly inattentive type: Secondary | ICD-10-CM | POA: Diagnosis not present

## 2023-01-12 DIAGNOSIS — Z96641 Presence of right artificial hip joint: Secondary | ICD-10-CM | POA: Diagnosis not present

## 2023-01-12 DIAGNOSIS — K219 Gastro-esophageal reflux disease without esophagitis: Secondary | ICD-10-CM | POA: Diagnosis not present

## 2023-01-13 ENCOUNTER — Other Ambulatory Visit (HOSPITAL_BASED_OUTPATIENT_CLINIC_OR_DEPARTMENT_OTHER): Payer: Self-pay

## 2023-01-13 ENCOUNTER — Encounter: Payer: Self-pay | Admitting: Orthopaedic Surgery

## 2023-01-15 ENCOUNTER — Other Ambulatory Visit (HOSPITAL_BASED_OUTPATIENT_CLINIC_OR_DEPARTMENT_OTHER): Payer: Self-pay

## 2023-01-15 ENCOUNTER — Other Ambulatory Visit: Payer: Self-pay | Admitting: Orthopaedic Surgery

## 2023-01-15 ENCOUNTER — Encounter: Payer: Self-pay | Admitting: Orthopaedic Surgery

## 2023-01-15 DIAGNOSIS — F411 Generalized anxiety disorder: Secondary | ICD-10-CM | POA: Diagnosis not present

## 2023-01-15 DIAGNOSIS — K219 Gastro-esophageal reflux disease without esophagitis: Secondary | ICD-10-CM | POA: Diagnosis not present

## 2023-01-15 DIAGNOSIS — Z96641 Presence of right artificial hip joint: Secondary | ICD-10-CM | POA: Diagnosis not present

## 2023-01-15 DIAGNOSIS — F32A Depression, unspecified: Secondary | ICD-10-CM | POA: Diagnosis not present

## 2023-01-15 DIAGNOSIS — Z79891 Long term (current) use of opiate analgesic: Secondary | ICD-10-CM | POA: Diagnosis not present

## 2023-01-15 DIAGNOSIS — F900 Attention-deficit hyperactivity disorder, predominantly inattentive type: Secondary | ICD-10-CM | POA: Diagnosis not present

## 2023-01-15 DIAGNOSIS — E785 Hyperlipidemia, unspecified: Secondary | ICD-10-CM | POA: Diagnosis not present

## 2023-01-15 DIAGNOSIS — Z7982 Long term (current) use of aspirin: Secondary | ICD-10-CM | POA: Diagnosis not present

## 2023-01-15 DIAGNOSIS — Z471 Aftercare following joint replacement surgery: Secondary | ICD-10-CM | POA: Diagnosis not present

## 2023-01-15 MED ORDER — CYCLOBENZAPRINE HCL 10 MG PO TABS
10.0000 mg | ORAL_TABLET | Freq: Three times a day (TID) | ORAL | 0 refills | Status: DC | PRN
  Filled 2023-01-15: qty 60, 20d supply, fill #0

## 2023-01-15 MED ORDER — GABAPENTIN 100 MG PO CAPS
100.0000 mg | ORAL_CAPSULE | Freq: Three times a day (TID) | ORAL | 0 refills | Status: DC | PRN
  Filled 2023-01-15 (×2): qty 60, 20d supply, fill #0

## 2023-01-16 DIAGNOSIS — J342 Deviated nasal septum: Secondary | ICD-10-CM | POA: Diagnosis not present

## 2023-01-16 DIAGNOSIS — G4733 Obstructive sleep apnea (adult) (pediatric): Secondary | ICD-10-CM | POA: Diagnosis not present

## 2023-01-16 DIAGNOSIS — J329 Chronic sinusitis, unspecified: Secondary | ICD-10-CM | POA: Diagnosis not present

## 2023-01-17 DIAGNOSIS — Z7982 Long term (current) use of aspirin: Secondary | ICD-10-CM | POA: Diagnosis not present

## 2023-01-17 DIAGNOSIS — E785 Hyperlipidemia, unspecified: Secondary | ICD-10-CM | POA: Diagnosis not present

## 2023-01-17 DIAGNOSIS — Z96641 Presence of right artificial hip joint: Secondary | ICD-10-CM | POA: Diagnosis not present

## 2023-01-17 DIAGNOSIS — F411 Generalized anxiety disorder: Secondary | ICD-10-CM | POA: Diagnosis not present

## 2023-01-17 DIAGNOSIS — F900 Attention-deficit hyperactivity disorder, predominantly inattentive type: Secondary | ICD-10-CM | POA: Diagnosis not present

## 2023-01-17 DIAGNOSIS — F32A Depression, unspecified: Secondary | ICD-10-CM | POA: Diagnosis not present

## 2023-01-17 DIAGNOSIS — K219 Gastro-esophageal reflux disease without esophagitis: Secondary | ICD-10-CM | POA: Diagnosis not present

## 2023-01-17 DIAGNOSIS — Z79891 Long term (current) use of opiate analgesic: Secondary | ICD-10-CM | POA: Diagnosis not present

## 2023-01-17 DIAGNOSIS — Z471 Aftercare following joint replacement surgery: Secondary | ICD-10-CM | POA: Diagnosis not present

## 2023-01-19 ENCOUNTER — Other Ambulatory Visit (HOSPITAL_BASED_OUTPATIENT_CLINIC_OR_DEPARTMENT_OTHER): Payer: Self-pay

## 2023-01-19 DIAGNOSIS — F900 Attention-deficit hyperactivity disorder, predominantly inattentive type: Secondary | ICD-10-CM | POA: Diagnosis not present

## 2023-01-19 DIAGNOSIS — Z7982 Long term (current) use of aspirin: Secondary | ICD-10-CM | POA: Diagnosis not present

## 2023-01-19 DIAGNOSIS — F411 Generalized anxiety disorder: Secondary | ICD-10-CM | POA: Diagnosis not present

## 2023-01-19 DIAGNOSIS — Z79891 Long term (current) use of opiate analgesic: Secondary | ICD-10-CM | POA: Diagnosis not present

## 2023-01-19 DIAGNOSIS — Z471 Aftercare following joint replacement surgery: Secondary | ICD-10-CM | POA: Diagnosis not present

## 2023-01-19 DIAGNOSIS — E785 Hyperlipidemia, unspecified: Secondary | ICD-10-CM | POA: Diagnosis not present

## 2023-01-19 DIAGNOSIS — Z96641 Presence of right artificial hip joint: Secondary | ICD-10-CM | POA: Diagnosis not present

## 2023-01-19 DIAGNOSIS — F32A Depression, unspecified: Secondary | ICD-10-CM | POA: Diagnosis not present

## 2023-01-19 DIAGNOSIS — K219 Gastro-esophageal reflux disease without esophagitis: Secondary | ICD-10-CM | POA: Diagnosis not present

## 2023-01-22 ENCOUNTER — Other Ambulatory Visit (HOSPITAL_BASED_OUTPATIENT_CLINIC_OR_DEPARTMENT_OTHER): Payer: Self-pay

## 2023-01-22 MED ORDER — BETAMETHASONE DIPROPIONATE 0.05 % EX OINT
TOPICAL_OINTMENT | CUTANEOUS | 0 refills | Status: AC
  Filled 2023-01-22 – 2023-09-14 (×2): qty 15, 30d supply, fill #0

## 2023-01-24 ENCOUNTER — Telehealth: Payer: Self-pay

## 2023-01-24 NOTE — Telephone Encounter (Signed)
Patient had previously requested order for OP PT.  He is waiting to hear about scheduling.  He works in [employer] off of [employer].

## 2023-01-25 ENCOUNTER — Other Ambulatory Visit: Payer: Self-pay

## 2023-01-25 DIAGNOSIS — Z96641 Presence of right artificial hip joint: Secondary | ICD-10-CM

## 2023-01-25 NOTE — Telephone Encounter (Signed)
Referral sent 

## 2023-01-29 ENCOUNTER — Other Ambulatory Visit (HOSPITAL_BASED_OUTPATIENT_CLINIC_OR_DEPARTMENT_OTHER): Payer: Self-pay

## 2023-01-31 ENCOUNTER — Encounter: Payer: Self-pay | Admitting: Physical Therapy

## 2023-01-31 ENCOUNTER — Other Ambulatory Visit: Payer: Self-pay

## 2023-01-31 ENCOUNTER — Ambulatory Visit: Payer: 59 | Admitting: Physical Therapy

## 2023-01-31 DIAGNOSIS — R262 Difficulty in walking, not elsewhere classified: Secondary | ICD-10-CM | POA: Diagnosis not present

## 2023-01-31 DIAGNOSIS — M6281 Muscle weakness (generalized): Secondary | ICD-10-CM

## 2023-01-31 DIAGNOSIS — M25551 Pain in right hip: Secondary | ICD-10-CM

## 2023-01-31 NOTE — Therapy (Signed)
OUTPATIENT PHYSICAL THERAPY LOWER EXTREMITY EVALUATION   Patient Name: Connor Martin MRN: [id number] DOB:August 29, 1976, 47 y.o., male Today's Date: 01/31/2023  END OF SESSION:  PT End of Session - 01/31/23 1512     Visit Number 1    Number of Visits 8    Date for PT Re-Evaluation 02/28/23    PT Start Time 1515    PT Stop Time 1555    PT Time Calculation (min) 40 min    Activity Tolerance Patient tolerated treatment well    Behavior During Therapy [facility] for tasks assessed/performed             Past Medical History:  Diagnosis Date   Acute pancreatitis hospitalized 04/06/2016   Acute sinusitis    ADD (attention deficit disorder)    Anxiety    Blood in stool    Bronchitis    Chronic lower back pain    Depression    Dyslipidemia    Family history of colon cancer    Fatty liver    GERD (gastroesophageal reflux disease)    Headache    "@ least once/week" (04/07/2016)   Hypertriglyceridemia    Hypertriglyceridemia    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    Migraine    "q other month" (04/07/2016)   Nasal congestion    Palpitations    Pancreatitis    Panic attacks    Pneumonia ~ 1981   Sleep apnea    Snoring    Past Surgical History:  Procedure Laterality Date   TONSILLECTOMY AND ADENOIDECTOMY  ~ 1990   TOTAL HIP ARTHROPLASTY Right 12/29/2022   Procedure: RIGHT TOTAL HIP ARTHROPLASTY ANTERIOR APPROACH;  Surgeon: Kathryne Hitch, MD;  Location: [facility];  Service: Orthopedics;  Laterality: Right;   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/29/2022   Avascular necrosis of bone of right hip (HCC) 10/11/2022   Dyslipidemia 02/25/2020   ADHD, 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   Adjustment disorder with depressed mood 02/25/2020   Nutritional anemia 02/25/2020   Fatty liver 09/02/2016   History of panic attacks 08/30/2016   GAD (generalized anxiety disorder) 08/30/2016   Gastroesophageal reflux  disease without esophagitis    High blood triglycerides    Pancreatitis 04/07/2016   Abnormal LFTs 04/06/2016   Alcohol use 04/06/2016   Cough 04/06/2016   Pancreatitis, acute 04/06/2016   Panic attacks    GERD (gastroesophageal reflux disease)     PCP: Kerry Fort, MD   REFERRING PROVIDER: Kathryne Hitch, MD  REFERRING DIAG: [phone] (ICD-10-CM) - Status post total replacement of right hip  THERAPY DIAG:  Pain in right hip  Muscle weakness (generalized)  Difficulty in walking, not elsewhere classified  Rationale for Evaluation and Treatment: Rehabilitation  ONSET DATE: Rt THA 12/29/22  SUBJECTIVE:   SUBJECTIVE STATEMENT: He had HHPT after surgery. He used RW for one week then to cane and now he does not need AD for walking. He finished up with HHPT 01/19/23. He does say he will need the other hip replaced in July due to AVN.  PERTINENT HISTORY:PMH: Rt THA 12/29/22 due to AVN, chronic LBP,DEP, migraines  PAIN:  Are you having pain? Yes: NPRS scale: 1 currently /10 Pain location: Rt hip Pain description: Rt anterior, and lateral hip Aggravating factors: wakes up with it sore and stiff, stiff with first getting up, and then by end of day due to activity Relieving factors: ice  PRECAUTIONS: Anterior hip  WEIGHT BEARING RESTRICTIONS: No  FALLS:  Has patient fallen in last 6 months? No  LIVING ENVIRONMENT: Has steps, he can do these reciprocally he reports  OCCUPATION: Firefighter  PLOF: Independent  PATIENT GOALS: be able to get down in floor to play with 63 month old  NEXT MD VISIT: 02/12/23  OBJECTIVE:   DIAGNOSTIC FINDINGS:   PATIENT SURVEYS:  FOTO 70% functional intake at eval, goal is 79%  COGNITION: Overall cognitive status: Within functional limits for tasks assessed     SENSATION: WFL   POSTURE: No Significant postural limitations    LOWER EXTREMITY ROM:  Active ROM Right eval Left eval  Hip flexion 90   Hip extension     Hip abduction 40   Hip adduction    Hip internal rotation [facility]   Hip external rotation 25   Knee flexion    Knee extension    Ankle dorsiflexion    Ankle plantarflexion    Ankle inversion    Ankle eversion     (Blank rows = not tested)  LOWER EXTREMITY MMT:  MMT Right eval   Hip flexion 4+   Hip extension 4+   Hip abduction 4   Hip adduction 4+   Hip internal rotation 4+   Hip external rotation 4+   Knee flexion 5   Knee extension 5   Ankle dorsiflexion    Ankle plantarflexion    Ankle inversion    Ankle eversion     (Blank rows = not tested)  LOWER EXTREMITY SPECIAL TESTS:    FUNCTIONAL TESTS:  Eval: 5 times sit to stand test without UE support 9.22 seconds (less weight shift to Rt leg compared to left) SLS 20 sec bilat  GAIT:  Eval Distance walked: 150 feet Assistive device utilized: none Level of assistance: Complete Independence Comments: very mild limp noted on Right at times   TODAY'S TREATMENT:  Eval HEP creation and review with demonstration and trial set preformed, see below for details Recumbent bike L5 X 5 min    PATIENT EDUCATION: Education details: HEP, PT plan of care Person educated: Patient Education method: Explanation, Demonstration, Verbal cues, and Handouts Education comprehension: verbalized understanding and needs further education   HOME EXERCISE PROGRAM: Access Code: [email or web address] URL: [email or web address] Date: 01/31/2023 Prepared by: Ivery Quale  Exercises - Supine Bridge  - 2 x daily - 6 x weekly - 1-2 sets - 10 reps - 5 hold - Straight Leg Raise with External Rotation  - 2 x daily - 6 x weekly - 2 sets - 10 reps - Supine Piriformis Stretch with Foot on Ground  - 2 x daily - 6 x weekly - 1 sets - 2 reps - 30 hold - Supine Figure 4 Piriformis Stretch  - 2 x daily - 6 x weekly - 1 sets - 2 reps - 30 hold - Standing Hip Abduction with Resistance at Ankles and Unilateral Counter Support  - 2 x daily - 6 x weekly  - 2 sets - 10-15 reps - Hip Extension with Resistance Loop  - 2 x daily - 6 x weekly - 2 sets - 10-15 reps - Marching with Resistance  - 2 x daily - 6 x weekly - 2 sets - 10-15 reps - Single Leg Stance (Mirrored)  - 2 x daily - 6 x weekly - 1 sets - 3 reps - 20 sec hold - Sit to Stand Without Arm Support  - 2 x daily - 6  x weekly - 1-2 sets - 10 reps  ASSESSMENT:  CLINICAL IMPRESSION: Patient referred to PT for Eval and Treat s/p right THA anterior approach on 12/29/22 due to AVN. He is doing quite well up to this point with his strength and pain levels. Patient will benefit from skilled PT to address below impairments, limitations and improve overall function.  OBJECTIVE IMPAIRMENTS: decreased activity tolerance, difficulty walking, decreased endurance, decreased mobility, decreased ROM, decreased strength, impaired flexibility, and pain.  ACTIVITY LIMITATIONS: bending, lifting,locomotion, getting up/down from floor PERSONAL FACTORS:  PMH: Rt THA 12/29/22 due to AVN, chronic LBP,DEP, migraines are also affecting patient's functional outcome.  REHAB POTENTIAL: Excellent  CLINICAL DECISION MAKING: Stable/uncomplicated  EVALUATION COMPLEXITY: Low    GOALS: Short term PT Goals Target date: 02/14/2023  Pt will be I and compliant with HEP. Baseline:  Goal status: New   Long term PT goals Target date:02/28/2023   Pt will improve Rt hip ROM to [facility] to improve functional mobility Baseline: Goal status: New Pt will improve Rt hip strength to at least 5/5 MMT to improve functional strength Baseline: Goal status: New Pt will improve FOTO to at least 79% functional to show improved function Baseline: Goal status: New Pt will reduce pain to overall less than 2-3/10 with usual activity and community ambulation Baseline: Goal status: NeW  PLAN: PT FREQUENCY: 1-2 times per week   PT DURATION: 4 weeks  PLANNED INTERVENTIONS (unless contraindicated): aquatic PT, Canalith repositioning,  cryotherapy, Electrical stimulation, Iontophoresis with 4 mg/ml dexamethasome, Moist heat, traction, Ultrasound, gait training, Therapeutic exercise, balance training, neuromuscular re-education, patient/family education, prosthetic training, manual techniques, passive ROM, dry needling, taping, vasopnuematic device, vestibular, spinal manipulations, joint manipulations  PLAN FOR NEXT SESSION: HEP progressions as tolerated as he will likely progress quickly.     April Manson, PT,DPT 01/31/2023, 4:55 PM

## 2023-02-01 ENCOUNTER — Other Ambulatory Visit (HOSPITAL_BASED_OUTPATIENT_CLINIC_OR_DEPARTMENT_OTHER): Payer: Self-pay

## 2023-02-01 DIAGNOSIS — L409 Psoriasis, unspecified: Secondary | ICD-10-CM | POA: Diagnosis not present

## 2023-02-01 DIAGNOSIS — F419 Anxiety disorder, unspecified: Secondary | ICD-10-CM | POA: Diagnosis not present

## 2023-02-01 DIAGNOSIS — E785 Hyperlipidemia, unspecified: Secondary | ICD-10-CM | POA: Diagnosis not present

## 2023-02-01 DIAGNOSIS — M879 Osteonecrosis, unspecified: Secondary | ICD-10-CM | POA: Diagnosis not present

## 2023-02-01 DIAGNOSIS — K861 Other chronic pancreatitis: Secondary | ICD-10-CM | POA: Diagnosis not present

## 2023-02-01 DIAGNOSIS — I10 Essential (primary) hypertension: Secondary | ICD-10-CM | POA: Diagnosis not present

## 2023-02-01 DIAGNOSIS — F900 Attention-deficit hyperactivity disorder, predominantly inattentive type: Secondary | ICD-10-CM | POA: Diagnosis not present

## 2023-02-01 DIAGNOSIS — E039 Hypothyroidism, unspecified: Secondary | ICD-10-CM | POA: Diagnosis not present

## 2023-02-01 DIAGNOSIS — K219 Gastro-esophageal reflux disease without esophagitis: Secondary | ICD-10-CM | POA: Diagnosis not present

## 2023-02-01 DIAGNOSIS — F4321 Adjustment disorder with depressed mood: Secondary | ICD-10-CM | POA: Diagnosis not present

## 2023-02-01 MED ORDER — OXYCODONE-ACETAMINOPHEN 7.5-325 MG PO TABS
1.0000 | ORAL_TABLET | Freq: Two times a day (BID) | ORAL | 0 refills | Status: DC | PRN
  Filled 2023-03-22: qty 60, 30d supply, fill #0

## 2023-02-01 MED ORDER — ALPRAZOLAM 0.5 MG PO TABS
0.5000 mg | ORAL_TABLET | Freq: Two times a day (BID) | ORAL | 2 refills | Status: AC | PRN
  Filled 2023-02-01: qty 40, 20d supply, fill #0
  Filled 2023-03-22: qty 40, 20d supply, fill #1
  Filled 2023-04-22: qty 40, 20d supply, fill #2

## 2023-02-01 MED ORDER — AMPHETAMINE-DEXTROAMPHETAMINE 20 MG PO TABS
20.0000 mg | ORAL_TABLET | Freq: Three times a day (TID) | ORAL | 0 refills | Status: DC
  Filled 2023-02-01: qty 90, 30d supply, fill #0

## 2023-02-01 MED ORDER — AMPHETAMINE-DEXTROAMPHETAMINE 20 MG PO TABS
20.0000 mg | ORAL_TABLET | Freq: Three times a day (TID) | ORAL | 0 refills | Status: DC
  Filled 2023-03-22: qty 90, 30d supply, fill #0

## 2023-02-01 MED ORDER — OXYCODONE-ACETAMINOPHEN 7.5-325 MG PO TABS
1.0000 | ORAL_TABLET | Freq: Two times a day (BID) | ORAL | 0 refills | Status: DC | PRN
  Filled 2023-02-01: qty 60, 30d supply, fill #0

## 2023-02-02 ENCOUNTER — Other Ambulatory Visit (HOSPITAL_BASED_OUTPATIENT_CLINIC_OR_DEPARTMENT_OTHER): Payer: Self-pay

## 2023-02-06 ENCOUNTER — Ambulatory Visit: Payer: 59 | Admitting: Physical Therapy

## 2023-02-07 ENCOUNTER — Ambulatory Visit: Payer: 59 | Admitting: Rehabilitative and Restorative Service Providers"

## 2023-02-07 ENCOUNTER — Encounter: Payer: Self-pay | Admitting: Rehabilitative and Restorative Service Providers"

## 2023-02-07 DIAGNOSIS — M25551 Pain in right hip: Secondary | ICD-10-CM | POA: Diagnosis not present

## 2023-02-07 DIAGNOSIS — R262 Difficulty in walking, not elsewhere classified: Secondary | ICD-10-CM

## 2023-02-07 DIAGNOSIS — M6281 Muscle weakness (generalized): Secondary | ICD-10-CM | POA: Diagnosis not present

## 2023-02-07 NOTE — Therapy (Signed)
OUTPATIENT PHYSICAL THERAPY LOWER EXTREMITY EVALUATION   Patient Name: Connor Martin MRN: [id number] DOB:[birth year], 47 y.o., male Today's Date: 02/07/2023  END OF SESSION:  PT End of Session - 02/07/23 1608     Visit Number 2    Number of Visits 8    Date for PT Re-Evaluation 02/28/23    PT Start Time 1603    PT Stop Time 1641    PT Time Calculation (min) 38 min    Activity Tolerance Patient tolerated treatment well    Behavior During Therapy [facility] for tasks assessed/performed              Past Medical History:  Diagnosis Date   Acute pancreatitis hospitalized 04/06/2016   Acute sinusitis    ADD (attention deficit disorder)    Anxiety    Blood in stool    Bronchitis    Chronic lower back pain    Depression    Dyslipidemia    Family history of colon cancer    Fatty liver    GERD (gastroesophageal reflux disease)    Headache    "@ least once/week" (04/07/2016)   Hypertriglyceridemia    Hypertriglyceridemia    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    Migraine    "q other month" (04/07/2016)   Nasal congestion    Palpitations    Pancreatitis    Panic attacks    Pneumonia ~ 1981   Sleep apnea    Snoring    Past Surgical History:  Procedure Laterality Date   TONSILLECTOMY AND ADENOIDECTOMY  ~ 1990   TOTAL HIP ARTHROPLASTY Right 12/29/2022   Procedure: RIGHT TOTAL HIP ARTHROPLASTY ANTERIOR APPROACH;  Surgeon: Kathryne Hitch, MD;  Location: [facility];  Service: Orthopedics;  Laterality: Right;   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/29/2022   Avascular necrosis of bone of right hip (HCC) 10/11/2022   Dyslipidemia 02/25/2020   ADHD, 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   Adjustment disorder with depressed mood 02/25/2020   Nutritional anemia 02/25/2020   Fatty liver 09/02/2016   History of panic attacks 08/30/2016   GAD (generalized anxiety disorder) 08/30/2016   Gastroesophageal  reflux disease without esophagitis    High blood triglycerides    Pancreatitis 04/07/2016   Abnormal LFTs 04/06/2016   Alcohol use 04/06/2016   Cough 04/06/2016   Pancreatitis, acute 04/06/2016   Panic attacks    GERD (gastroesophageal reflux disease)     PCP: Kerry Fort, MD   REFERRING PROVIDER: Kathryne Hitch, MD  REFERRING DIAG: [phone] (ICD-10-CM) - Status post total replacement of right hip  THERAPY DIAG:  Pain in right hip  Muscle weakness (generalized)  Difficulty in walking, not elsewhere classified  Rationale for Evaluation and Treatment: Rehabilitation  ONSET DATE: Rt THA 12/29/22  SUBJECTIVE:   SUBJECTIVE STATEMENT: He reported having minimal pain on Rt side.  4/10 at worst on Lt side noted since last visit.   PERTINENT HISTORY:PMH: Rt THA 12/29/22 due to AVN, chronic LBP,DEP, migraines  PAIN:  NPRS scale: at worst 1/10 in last week  Pain location: Rt hip Pain description: Rt anterior, and lateral hip Aggravating factors: wakes up with it sore and stiff, stiff with first getting up, and then by end of day due to activity Relieving factors: ice  PRECAUTIONS: Anterior hip  WEIGHT BEARING RESTRICTIONS: No  FALLS:  Has patient fallen in last 6 months? No  LIVING ENVIRONMENT: Has steps,  he can do these reciprocally he reports  OCCUPATION: Firefighter  PLOF: Independent  PATIENT GOALS: be able to get down in floor to play with 66 month old  NEXT MD VISIT: 02/12/23  OBJECTIVE:   DIAGNOSTIC FINDINGS:   PATIENT SURVEYS:  FOTO 70% functional intake at eval, goal is 79%  COGNITION: Overall cognitive status: Within functional limits for tasks assessed     SENSATION: WFL   POSTURE: No Significant postural limitations    LOWER EXTREMITY ROM:  Active ROM Right eval Left eval  Hip flexion 90   Hip extension    Hip abduction 40   Hip adduction    Hip internal rotation [employer] [facility]   Hip external rotation 25   Knee flexion    Knee  extension    Ankle dorsiflexion    Ankle plantarflexion    Ankle inversion    Ankle eversion     (Blank rows = not tested)  LOWER EXTREMITY MMT:  MMT Right eval Right 02/07/2023  Hip flexion 4+ 5/5  Hip extension 4+   Hip abduction 4   Hip adduction 4+   Hip internal rotation 4+   Hip external rotation 4+   Knee flexion 5   Knee extension 5   Ankle dorsiflexion    Ankle plantarflexion    Ankle inversion    Ankle eversion     (Blank rows = not tested)  LOWER EXTREMITY SPECIAL TESTS:    FUNCTIONAL TESTS:  Eval: 5 times sit to stand test without UE support 9.22 seconds (less weight shift to Rt leg compared to left) SLS 20 sec bilat  GAIT:  Eval Distance walked: 150 feet Assistive device utilized: none Level of assistance: Complete Independence Comments: very mild limp noted on Right at times   TODAY'S TREATMENT:  02/07/2023 Therex: Recumbent bike 8.5 mins seat 10 lvl 3 Lateral step down 4 inch step WB on Rt leg x8 (stopped due to knee pain noted) Leg press Double leg  150 lbs x 15  , single leg Lt leg 75 lbs x 15 (not performed on Rt due to knee pain) Seated SLR 2 x 15 slow lowering performed bilaterally  Neuro Re-ed: SLS on airex foam with corner touching contralateral leg x 8 each bilaterally.  SLS with vector reaching forward, lateral, reverse x 8 bilaterally    Eval HEP creation and review with demonstration and trial set preformed, see below for details Recumbent bike L5 X 5 min    PATIENT EDUCATION: Education details: HEP, PT plan of care Person educated: Patient Education method: Explanation, Demonstration, Verbal cues, and Handouts Education comprehension: verbalized understanding and needs further education   HOME EXERCISE PROGRAM: Access Code: [email or web address] URL: [email or web address] Date: 01/31/2023 Prepared by: Ivery Quale  Exercises - Supine Bridge  - 2 x daily - 6 x weekly - 1-2 sets - 10 reps - 5 hold - Straight Leg Raise  with External Rotation  - 2 x daily - 6 x weekly - 2 sets - 10 reps - Supine Piriformis Stretch with Foot on Ground  - 2 x daily - 6 x weekly - 1 sets - 2 reps - 30 hold - Supine Figure 4 Piriformis Stretch  - 2 x daily - 6 x weekly - 1 sets - 2 reps - 30 hold - Standing Hip Abduction with Resistance at Ankles and Unilateral Counter Support  - 2 x daily - 6 x weekly - 2 sets - 10-15 reps - Hip Extension with Resistance Loop  -  2 x daily - 6 x weekly - 2 sets - 10-15 reps - Marching with Resistance  - 2 x daily - 6 x weekly - 2 sets - 10-15 reps - Single Leg Stance (Mirrored)  - 2 x daily - 6 x weekly - 1 sets - 3 reps - 20 sec hold - Sit to Stand Without Arm Support  - 2 x daily - 6 x weekly - 1-2 sets - 10 reps  ASSESSMENT:  CLINICAL IMPRESSION:  Good overall presentation, good knowledge of HEP.  Performed gym based activity and balance interventions to promote improved strength and coordination.  Plan to continue to encourage HEP /gym program for long term gains.   Some interventions limited by presence of history of Rt knee pain and noted in WB step.    OBJECTIVE IMPAIRMENTS: decreased activity tolerance, difficulty walking, decreased endurance, decreased mobility, decreased ROM, decreased strength, impaired flexibility, and pain.  ACTIVITY LIMITATIONS: bending, lifting,locomotion, getting up/down from floor PERSONAL FACTORS:  PMH: Rt THA 12/29/22 due to AVN, chronic LBP,DEP, migraines are also affecting patient's functional outcome.  REHAB POTENTIAL: Excellent  CLINICAL DECISION MAKING: Stable/uncomplicated  EVALUATION COMPLEXITY: Low    GOALS: Short term PT Goals Target date: 02/14/2023  Pt will be I and compliant with HEP. Baseline:  Goal status: on going 02/07/2023   Long term PT goals Target date:02/28/2023   Pt will improve Rt hip ROM to [facility] to improve functional mobility Baseline: Goal status: New Pt will improve Rt hip strength to at least 5/5 MMT to improve  functional strength Baseline: Goal status: New Pt will improve FOTO to at least 79% functional to show improved function Baseline: Goal status: New Pt will reduce pain to overall less than 2-3/10 with usual activity and community ambulation Baseline: Goal status: NeW  PLAN: PT FREQUENCY: 1-2 times per week   PT DURATION: 4 weeks  PLANNED INTERVENTIONS (unless contraindicated): aquatic PT, Canalith repositioning, cryotherapy, Electrical stimulation, Iontophoresis with 4 mg/ml dexamethasome, Moist heat, traction, Ultrasound, gait training, Therapeutic exercise, balance training, neuromuscular re-education, patient/family education, prosthetic training, manual techniques, passive ROM, dry needling, taping, vasopnuematic device, vestibular, spinal manipulations, joint manipulations  PLAN FOR NEXT SESSION:  Continue exposure to gym based activity for future HEP.   Chyrel Masson, PT, DPT, OCS, ATC 02/07/23  4:40 PM

## 2023-02-08 ENCOUNTER — Other Ambulatory Visit (HOSPITAL_BASED_OUTPATIENT_CLINIC_OR_DEPARTMENT_OTHER): Payer: Self-pay

## 2023-02-08 ENCOUNTER — Encounter: Payer: 59 | Admitting: Rehabilitative and Restorative Service Providers"

## 2023-02-10 ENCOUNTER — Encounter (HOSPITAL_BASED_OUTPATIENT_CLINIC_OR_DEPARTMENT_OTHER): Payer: Self-pay

## 2023-02-10 ENCOUNTER — Other Ambulatory Visit (HOSPITAL_BASED_OUTPATIENT_CLINIC_OR_DEPARTMENT_OTHER): Payer: Self-pay

## 2023-02-12 ENCOUNTER — Ambulatory Visit (INDEPENDENT_AMBULATORY_CARE_PROVIDER_SITE_OTHER): Payer: 59 | Admitting: Orthopaedic Surgery

## 2023-02-12 ENCOUNTER — Encounter: Payer: Self-pay | Admitting: Orthopaedic Surgery

## 2023-02-12 ENCOUNTER — Other Ambulatory Visit (HOSPITAL_BASED_OUTPATIENT_CLINIC_OR_DEPARTMENT_OTHER): Payer: Self-pay

## 2023-02-12 DIAGNOSIS — Z96641 Presence of right artificial hip joint: Secondary | ICD-10-CM

## 2023-02-12 DIAGNOSIS — M87052 Idiopathic aseptic necrosis of left femur: Secondary | ICD-10-CM

## 2023-02-12 NOTE — Progress Notes (Signed)
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  He is only 47 years old.  He has been in outpatient physical therapy as well working on his strength as well as his balance and coordination.  He is scheduled to have his right hip replaced in mid July.  That is for avascular necrosis as well.  He is walking without assistive device.  He looks great overall.  Both hips move smoothly and fluidly without issues the case with AVN.  He will continue his therapy in terms of strengthening his right hip.  We will see him at the time of surgery in July for his left hip replacement to treat AVN of the left hip.

## 2023-02-13 ENCOUNTER — Ambulatory Visit: Payer: 59 | Admitting: Physical Therapy

## 2023-02-13 ENCOUNTER — Encounter: Payer: Self-pay | Admitting: Physical Therapy

## 2023-02-13 DIAGNOSIS — M6281 Muscle weakness (generalized): Secondary | ICD-10-CM | POA: Diagnosis not present

## 2023-02-13 DIAGNOSIS — R262 Difficulty in walking, not elsewhere classified: Secondary | ICD-10-CM

## 2023-02-13 DIAGNOSIS — M25551 Pain in right hip: Secondary | ICD-10-CM

## 2023-02-13 NOTE — Therapy (Signed)
OUTPATIENT PHYSICAL THERAPY LOWER EXTREMITY TREATMENT   Patient Name: BLANCH DYKHUIZEN MRN: [id number] DOB:11/13/1975, 47 y.o., male Today's Date: 02/13/2023  END OF SESSION:  PT End of Session - 02/13/23 1606     Visit Number 3    Number of Visits 8    Date for PT Re-Evaluation 02/28/23    PT Start Time 1602    PT Stop Time 1640    PT Time Calculation (min) 38 min    Activity Tolerance Patient tolerated treatment well    Behavior During Therapy [facility] for tasks assessed/performed              Past Medical History:  Diagnosis Date   Acute pancreatitis hospitalized 04/06/2016   Acute sinusitis    ADD (attention deficit disorder)    Anxiety    Blood in stool    Bronchitis    Chronic lower back pain    Depression    Dyslipidemia    Family history of colon cancer    Fatty liver    GERD (gastroesophageal reflux disease)    Headache    "@ least once/week" (04/07/2016)   Hypertriglyceridemia    Hypertriglyceridemia    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    Migraine    "q other month" (04/07/2016)   Nasal congestion    Palpitations    Pancreatitis    Panic attacks    Pneumonia ~ 1981   Sleep apnea    Snoring    Past Surgical History:  Procedure Laterality Date   TONSILLECTOMY AND ADENOIDECTOMY  ~ 1990   TOTAL HIP ARTHROPLASTY Right 12/29/2022   Procedure: RIGHT TOTAL HIP ARTHROPLASTY ANTERIOR APPROACH;  Surgeon: Kathryne Hitch, MD;  Location: [facility];  Service: Orthopedics;  Laterality: Right;   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/29/2022   Avascular necrosis of bone of right hip (HCC) 10/11/2022   Dyslipidemia 02/25/2020   ADHD, 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   Adjustment disorder with depressed mood 02/25/2020   Nutritional anemia 02/25/2020   Fatty liver 09/02/2016   History of panic attacks 08/30/2016   GAD (generalized anxiety disorder) 08/30/2016   Gastroesophageal  reflux disease without esophagitis    High blood triglycerides    Pancreatitis 04/07/2016   Abnormal LFTs 04/06/2016   Alcohol use 04/06/2016   Cough 04/06/2016   Pancreatitis, acute 04/06/2016   Panic attacks    GERD (gastroesophageal reflux disease)     PCP: Kerry Fort, MD   REFERRING PROVIDER: Kathryne Hitch, MD  REFERRING DIAG: [phone] (ICD-10-CM) - Status post total replacement of right hip  THERAPY DIAG:  Pain in right hip  Muscle weakness (generalized)  Difficulty in walking, not elsewhere classified  Rationale for Evaluation and Treatment: Rehabilitation  ONSET DATE: Rt THA 12/29/22  SUBJECTIVE:   SUBJECTIVE STATEMENT: He reported having minimal pain on Rt side.  3/10 at worst but is having some back pain and plantar fasciitis today.  PERTINENT HISTORY:PMH: Rt THA 12/29/22 due to AVN, chronic LBP,DEP, migraines  PAIN:  NPRS scale: at worst 1/10 in last week  Pain location: Rt hip Pain description: Rt anterior, and lateral hip Aggravating factors: wakes up with it sore and stiff, stiff with first getting up, and then by end of day due to activity Relieving factors: ice  PRECAUTIONS: Anterior hip  WEIGHT BEARING RESTRICTIONS: No  FALLS:  Has patient fallen in last 6 months? No  LIVING ENVIRONMENT:  Has steps, he can do these reciprocally he reports  OCCUPATION: Firefighter  PLOF: Independent  PATIENT GOALS: be able to get down in floor to play with 71 month old  NEXT MD VISIT:   OBJECTIVE:   DIAGNOSTIC FINDINGS:   PATIENT SURVEYS:  FOTO 70% functional intake at eval, goal is 79%  COGNITION: Overall cognitive status: Within functional limits for tasks assessed     SENSATION: WFL   POSTURE: No Significant postural limitations    LOWER EXTREMITY ROM:  Active ROM Right eval Left eval  Hip flexion 90   Hip extension    Hip abduction 40   Hip adduction    Hip internal rotation [facility]   Hip external rotation 25   Knee  flexion    Knee extension    Ankle dorsiflexion    Ankle plantarflexion    Ankle inversion    Ankle eversion     (Blank rows = not tested)  LOWER EXTREMITY MMT:  MMT Right eval Right 02/07/2023  Hip flexion 4+ 5/5  Hip extension 4+   Hip abduction 4   Hip adduction 4+   Hip internal rotation 4+   Hip external rotation 4+   Knee flexion 5   Knee extension 5   Ankle dorsiflexion    Ankle plantarflexion    Ankle inversion    Ankle eversion     (Blank rows = not tested)  LOWER EXTREMITY SPECIAL TESTS:    FUNCTIONAL TESTS:  Eval: 5 times sit to stand test without UE support 9.22 seconds (less weight shift to Rt leg compared to left) SLS 20 sec bilat  GAIT:  Eval Distance walked: 150 feet Assistive device utilized: none Level of assistance: Complete Independence Comments: very mild limp noted on Right at times   TODAY'S TREATMENT:  02/13/2023 Therex: Recumbent bike 8  mins seat 10 lvl 3 Standing gastroc stretch and soleus stretch 30 sec X 2 each on slantboard Step ups fwd 6 inch step X 15 bilat Step ups lateral 6 inch step X 15 bilat Lateral walking with red band around ankles with squat each step 3 round trips in bars Monster walking with red band around ankles 3 round trips in bars Sit to stands with 10# KB and red band around knees 2X10 Seated figure 4 hip stretch into ER 10 sec hold X 5 Leg press Double leg  150 lbs 2x 15  Seated SLR 2 x 10 slow lowering performed bilaterally   02/07/2023 Therex: Recumbent bike 8.5 mins seat 10 lvl 3 Lateral step down 4 inch step WB on Rt leg x8 (stopped due to knee pain noted) Leg press Double leg  150 lbs x 15  , single leg Lt leg 75 lbs x 15 (not performed on Rt due to knee pain) Seated SLR 2 x 15 slow lowering performed bilaterally  Neuro Re-ed: SLS on airex foam with corner touching contralateral leg x 8 each bilaterally.  SLS with vector reaching forward, lateral, reverse x 8 bilaterally    Eval HEP creation and  review with demonstration and trial set preformed, see below for details Recumbent bike L5 X 5 min    PATIENT EDUCATION: Education details: HEP, PT plan of care Person educated: Patient Education method: Explanation, Demonstration, Verbal cues, and Handouts Education comprehension: verbalized understanding and needs further education   HOME EXERCISE PROGRAM: Access Code: [email or web address] URL: [email or web address] Date: 01/31/2023 Prepared by: Ivery Quale  Exercises - Supine Bridge  - 2 x daily - 6 x  weekly - 1-2 sets - 10 reps - 5 hold - Straight Leg Raise with External Rotation  - 2 x daily - 6 x weekly - 2 sets - 10 reps - Supine Piriformis Stretch with Foot on Ground  - 2 x daily - 6 x weekly - 1 sets - 2 reps - 30 hold - Supine Figure 4 Piriformis Stretch  - 2 x daily - 6 x weekly - 1 sets - 2 reps - 30 hold - Standing Hip Abduction with Resistance at Ankles and Unilateral Counter Support  - 2 x daily - 6 x weekly - 2 sets - 10-15 reps - Hip Extension with Resistance Loop  - 2 x daily - 6 x weekly - 2 sets - 10-15 reps - Marching with Resistance  - 2 x daily - 6 x weekly - 2 sets - 10-15 reps - Single Leg Stance (Mirrored)  - 2 x daily - 6 x weekly - 1 sets - 3 reps - 20 sec hold - Sit to Stand Without Arm Support  - 2 x daily - 6 x weekly - 1-2 sets - 10 reps  ASSESSMENT:  CLINICAL IMPRESSION:  He is overall progressing with strength and ROM, does still have some limitations and will continue to benefit from skilled PT.    OBJECTIVE IMPAIRMENTS: decreased activity tolerance, difficulty walking, decreased endurance, decreased mobility, decreased ROM, decreased strength, impaired flexibility, and pain.  ACTIVITY LIMITATIONS: bending, lifting,locomotion, getting up/down from floor PERSONAL FACTORS:  PMH: Rt THA 12/29/22 due to AVN, chronic LBP,DEP, migraines are also affecting patient's functional outcome.  REHAB POTENTIAL: Excellent  CLINICAL DECISION MAKING:  Stable/uncomplicated  EVALUATION COMPLEXITY: Low    GOALS: Short term PT Goals Target date: 02/14/2023  Pt will be I and compliant with HEP. Baseline:  Goal status: on going 02/07/2023   Long term PT goals Target date:02/28/2023   Pt will improve Rt hip ROM to [facility] to improve functional mobility Baseline: Goal status: New Pt will improve Rt hip strength to at least 5/5 MMT to improve functional strength Baseline: Goal status: New Pt will improve FOTO to at least 79% functional to show improved function Baseline: Goal status: New Pt will reduce pain to overall less than 2-3/10 with usual activity and community ambulation Baseline: Goal status: NeW  PLAN: PT FREQUENCY: 1-2 times per week   PT DURATION: 4 weeks  PLANNED INTERVENTIONS (unless contraindicated): aquatic PT, Canalith repositioning, cryotherapy, Electrical stimulation, Iontophoresis with 4 mg/ml dexamethasome, Moist heat, traction, Ultrasound, gait training, Therapeutic exercise, balance training, neuromuscular re-education, patient/family education, prosthetic training, manual techniques, passive ROM, dry needling, taping, vasopnuematic device, vestibular, spinal manipulations, joint manipulations  PLAN FOR NEXT SESSION:  Continue with hip strength and ROM progressions as tolerated and exposure to gym based activity for future HEP.  Ivery Quale, PT, DPT 02/13/23 4:28 PM

## 2023-02-14 ENCOUNTER — Other Ambulatory Visit (HOSPITAL_BASED_OUTPATIENT_CLINIC_OR_DEPARTMENT_OTHER): Payer: Self-pay

## 2023-02-15 ENCOUNTER — Encounter: Payer: Self-pay | Admitting: Physical Therapy

## 2023-02-15 ENCOUNTER — Ambulatory Visit: Payer: 59 | Admitting: Physical Therapy

## 2023-02-15 DIAGNOSIS — M6281 Muscle weakness (generalized): Secondary | ICD-10-CM | POA: Diagnosis not present

## 2023-02-15 DIAGNOSIS — M25551 Pain in right hip: Secondary | ICD-10-CM | POA: Diagnosis not present

## 2023-02-15 DIAGNOSIS — R262 Difficulty in walking, not elsewhere classified: Secondary | ICD-10-CM

## 2023-02-15 NOTE — Therapy (Addendum)
OUTPATIENT PHYSICAL THERAPY LOWER EXTREMITY TREATMENT  /DISCHARGE   Patient Name: Connor Martin MRN: [id number] DOB:13-Apr-1976, 47 y.o., male Today's Date: 02/15/2023  END OF SESSION:  PT End of Session - 02/15/23 1611     Visit Number 4    Number of Visits 8    Date for PT Re-Evaluation 02/28/23    PT Start Time 1602    PT Stop Time 1640    PT Time Calculation (min) 38 min    Activity Tolerance Patient tolerated treatment well    Behavior During Therapy [facility] for tasks assessed/performed              Past Medical History:  Diagnosis Date   Acute pancreatitis hospitalized 04/06/2016   Acute sinusitis    ADD (attention deficit disorder)    Anxiety    Blood in stool    Bronchitis    Chronic lower back pain    Depression    Dyslipidemia    Family history of colon cancer    Fatty liver    GERD (gastroesophageal reflux disease)    Headache    "@ least once/week" (04/07/2016)   Hypertriglyceridemia    Hypertriglyceridemia    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    Migraine    "q other month" (04/07/2016)   Nasal congestion    Palpitations    Pancreatitis    Panic attacks    Pneumonia ~ 1981   Sleep apnea    Snoring    Past Surgical History:  Procedure Laterality Date   TONSILLECTOMY AND ADENOIDECTOMY  ~ 1990   TOTAL HIP ARTHROPLASTY Right 12/29/2022   Procedure: RIGHT TOTAL HIP ARTHROPLASTY ANTERIOR APPROACH;  Surgeon: Kathryne Hitch, MD;  Location: [facility];  Service: Orthopedics;  Laterality: Right;   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/29/2022   Avascular necrosis of bone of right hip (HCC) 10/11/2022   Dyslipidemia 02/25/2020   ADHD, 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   Adjustment disorder with depressed mood 02/25/2020   Nutritional anemia 02/25/2020   Fatty liver 09/02/2016   History of panic attacks 08/30/2016   GAD (generalized anxiety disorder) 08/30/2016    Gastroesophageal reflux disease without esophagitis    High blood triglycerides    Pancreatitis 04/07/2016   Abnormal LFTs 04/06/2016   Alcohol use 04/06/2016   Cough 04/06/2016   Pancreatitis, acute 04/06/2016   Panic attacks    GERD (gastroesophageal reflux disease)     PCP: Kerry Fort, MD   REFERRING PROVIDER: Kathryne Hitch, MD  REFERRING DIAG: [phone] (ICD-10-CM) - Status post total replacement of right hip  THERAPY DIAG:  Pain in right hip  Muscle weakness (generalized)  Difficulty in walking, not elsewhere classified  Rationale for Evaluation and Treatment: Rehabilitation  ONSET DATE: Rt THA 12/29/22  SUBJECTIVE:   SUBJECTIVE STATEMENT: He reported no Rt hip pain upon arrival.   PERTINENT HISTORY:PMH: Rt THA 12/29/22 due to AVN, chronic LBP,DEP, migraines  PAIN:  NPRS scale: at worst 1/10 in last week  Pain location: Rt hip Pain description: Rt anterior, and lateral hip Aggravating factors: wakes up with it sore and stiff, stiff with first getting up, and then by end of day due to activity Relieving factors: ice  PRECAUTIONS: Anterior hip  WEIGHT BEARING RESTRICTIONS: No  FALLS:  Has patient fallen in last 6 months? No  LIVING ENVIRONMENT: Has steps, he can do these reciprocally he reports  OCCUPATION:  financial advisor  PLOF: Independent  PATIENT GOALS: be able to get down in floor to play with 39 month old  NEXT MD VISIT:   OBJECTIVE:   DIAGNOSTIC FINDINGS:   PATIENT SURVEYS:  FOTO 70% functional intake at eval, goal is 79%  COGNITION: Overall cognitive status: Within functional limits for tasks assessed     SENSATION: WFL   POSTURE: No Significant postural limitations    LOWER EXTREMITY ROM:  Active ROM Right eval Left eval  Hip flexion 90   Hip extension    Hip abduction 40   Hip adduction    Hip internal rotation [facility]   Hip external rotation 25   Knee flexion    Knee extension    Ankle dorsiflexion    Ankle  plantarflexion    Ankle inversion    Ankle eversion     (Blank rows = not tested)  LOWER EXTREMITY MMT:  MMT Right eval Right 02/07/2023  Hip flexion 4+ 5/5  Hip extension 4+   Hip abduction 4   Hip adduction 4+   Hip internal rotation 4+   Hip external rotation 4+   Knee flexion 5   Knee extension 5   Ankle dorsiflexion    Ankle plantarflexion    Ankle inversion    Ankle eversion     (Blank rows = not tested)  LOWER EXTREMITY SPECIAL TESTS:    FUNCTIONAL TESTS:  Eval: 5 times sit to stand test without UE support 9.22 seconds (less weight shift to Rt leg compared to left) SLS 20 sec bilat  GAIT:  Eval Distance walked: 150 feet Assistive device utilized: none Level of assistance: Complete Independence Comments: very mild limp noted on Right at times   TODAY'S TREATMENT:  02/15/2023 Therex: Recumbent bike 8  mins seat 10 lvl 3 Standing hip flexion stretch 30 sec X 3 Step ups fwd 8 inch step X 15 up with Rt and down in front with left Step ups lateral 6 inch step X 15 bilat over and back Lateral walking with green band around ankles with squat each step 4 round trips in bars Monster walking with green band around ankles 4 round trips in bars Sit to stands with 15# KB 2X10 Deadlift 15# DB 2X10 Seated SLR 3 x 15 up and over cone stacked on cleaning wipe container (13.5 inch) performed bilat Blaze pods SLS 3 pods 30 sec X 2 bilat Blaze pods sit to stand 4 pods 30 sec using hands to turn off Blaze pods holding squat using hands to turn off 30 sec Seated figure 4 hip stretch into ER 10 sec hold X 5  02/13/2023 Therex: Recumbent bike 8  mins seat 10 lvl 3 Standing gastroc stretch and soleus stretch 30 sec X 2 each on slantboard Step ups fwd 6 inch step X 15 bilat Step ups lateral 6 inch step X 15 bilat Lateral walking with red band around ankles with squat each step 3 round trips in bars Monster walking with red band around ankles 3 round trips in bars Sit to  stands with 10# KB and red band around knees 2X10 Seated figure 4 hip stretch into ER 10 sec hold X 5 Leg press Double leg  150 lbs 2x 15  Seated SLR 2 x 10 slow lowering performed bilaterally   02/07/2023 Therex: Recumbent bike 8.5 mins seat 10 lvl 3 Lateral step down 4 inch step WB on Rt leg x8 (stopped due to knee pain noted) Leg press Double leg  150 lbs x 15  , single leg Lt leg 75 lbs x 15 (not performed on Rt due to knee pain) Seated SLR 2 x 15 slow lowering performed bilaterally  Neuro Re-ed: SLS on airex foam with corner touching contralateral leg x 8 each bilaterally.  SLS with vector reaching forward, lateral, reverse x 8 bilaterally    Eval HEP creation and review with demonstration and trial set preformed, see below for details Recumbent bike L5 X 5 min    PATIENT EDUCATION: Education details: HEP, PT plan of care Person educated: Patient Education method: Explanation, Demonstration, Verbal cues, and Handouts Education comprehension: verbalized understanding and needs further education   HOME EXERCISE PROGRAM: Access Code: [email or web address] URL: [email or web address] Date: 01/31/2023 Prepared by: Ivery Quale  Exercises - Supine Bridge  - 2 x daily - 6 x weekly - 1-2 sets - 10 reps - 5 hold - Straight Leg Raise with External Rotation  - 2 x daily - 6 x weekly - 2 sets - 10 reps - Supine Piriformis Stretch with Foot on Ground  - 2 x daily - 6 x weekly - 1 sets - 2 reps - 30 hold - Supine Figure 4 Piriformis Stretch  - 2 x daily - 6 x weekly - 1 sets - 2 reps - 30 hold - Standing Hip Abduction with Resistance at Ankles and Unilateral Counter Support  - 2 x daily - 6 x weekly - 2 sets - 10-15 reps - Hip Extension with Resistance Loop  - 2 x daily - 6 x weekly - 2 sets - 10-15 reps - Marching with Resistance  - 2 x daily - 6 x weekly - 2 sets - 10-15 reps - Single Leg Stance (Mirrored)  - 2 x daily - 6 x weekly - 1 sets - 3 reps - 20 sec hold - Sit to Stand  Without Arm Support  - 2 x daily - 6 x weekly - 1-2 sets - 10 reps  ASSESSMENT:  CLINICAL IMPRESSION:  We again progressed his functional strength program for Rt hip/knee with good overall tolerance. We will monitor for any soreness from this and adjust intensity accordingly.     OBJECTIVE IMPAIRMENTS: decreased activity tolerance, difficulty walking, decreased endurance, decreased mobility, decreased ROM, decreased strength, impaired flexibility, and pain.  ACTIVITY LIMITATIONS: bending, lifting,locomotion, getting up/down from floor PERSONAL FACTORS:  PMH: Rt THA 12/29/22 due to AVN, chronic LBP,DEP, migraines are also affecting patient's functional outcome.  REHAB POTENTIAL: Excellent  CLINICAL DECISION MAKING: Stable/uncomplicated  EVALUATION COMPLEXITY: Low    GOALS: Short term PT Goals Target date: 02/14/2023  Pt will be I and compliant with HEP. Baseline:  Goal status: on going 02/07/2023   Long term PT goals Target date:02/28/2023   Pt will improve Rt hip ROM to [facility] to improve functional mobility Baseline: Goal status: New Pt will improve Rt hip strength to at least 5/5 MMT to improve functional strength Baseline: Goal status: New Pt will improve FOTO to at least 79% functional to show improved function Baseline: Goal status: New Pt will reduce pain to overall less than 2-3/10 with usual activity and community ambulation Baseline: Goal status: NeW  PLAN: PT FREQUENCY: 1-2 times per week   PT DURATION: 4 weeks  PLANNED INTERVENTIONS (unless contraindicated): aquatic PT, Canalith repositioning, cryotherapy, Electrical stimulation, Iontophoresis with 4 mg/ml dexamethasome, Moist heat, traction, Ultrasound, gait training, Therapeutic exercise, balance training, neuromuscular re-education, patient/family education, prosthetic training, manual techniques, passive ROM, dry needling, taping, vasopnuematic device, vestibular, spinal  manipulations, joint  manipulations  PLAN FOR NEXT SESSION:  Continue with hip strength and ROM progressions as tolerated and exposure to gym based activity for future HEP.  Ivery Quale, PT, DPT 02/15/23 4:12 PM   PHYSICAL THERAPY DISCHARGE SUMMARY  Visits from Start of Care: 4  Current functional level related to goals / functional outcomes: See note   Remaining deficits: See note   Education / Equipment: HEP  Patient goals were partially met. Patient is being discharged due to not returning since the last visit.  Chyrel Masson, PT, DPT, OCS, ATC 03/22/23  3:34 PM

## 2023-02-16 ENCOUNTER — Other Ambulatory Visit (HOSPITAL_BASED_OUTPATIENT_CLINIC_OR_DEPARTMENT_OTHER): Payer: Self-pay

## 2023-02-20 ENCOUNTER — Encounter: Payer: 59 | Admitting: Physical Therapy

## 2023-02-21 ENCOUNTER — Other Ambulatory Visit (HOSPITAL_BASED_OUTPATIENT_CLINIC_OR_DEPARTMENT_OTHER): Payer: Self-pay

## 2023-02-22 ENCOUNTER — Encounter: Payer: 59 | Admitting: Physical Therapy

## 2023-03-22 ENCOUNTER — Other Ambulatory Visit (HOSPITAL_BASED_OUTPATIENT_CLINIC_OR_DEPARTMENT_OTHER): Payer: Self-pay

## 2023-03-22 ENCOUNTER — Other Ambulatory Visit: Payer: Self-pay

## 2023-03-23 ENCOUNTER — Other Ambulatory Visit: Payer: Self-pay

## 2023-04-08 NOTE — Progress Notes (Addendum)
COVID Vaccine received:  []  No [x]  Yes Date of any COVID positive Test in last 90 days:  none  PCP - Kerry Fort, MD Cardiologist - Dietrich Pates, MD  Chest x-ray - 06-28-2022 2v   Epic EKG - 04-10-2022    Will repeat at PST  Stress Test -  ECHO -  Cardiac Cath -   PCR screen: [x]  Ordered & Completed           []   No Order but Needs PROFEND           []   N/A for this surgery  Surgery Plan:  []  Ambulatory                            [x]  Outpatient in bed                            []  Admit  Anesthesia:    []  General  [x]  Spinal                           []   Choice []   MAC  Pacemaker / ICD device [x]  No []  Yes   Spinal Cord Stimulator:[x]  No []  Yes       History of Sleep Apnea? []  No [x]  Yes   CPAP used?- [x]  No []  Yes  Does the patient monitor blood sugar?          []  No []  Yes  [x]  N/A  Patient has: [x]  NO Hx DM   []  Pre-DM                 []  DM1  []   DM2    Blood Thinner / Instructions:  none Aspirin Instructions:   none   ERAS Protocol Ordered: []  No  [x]  Yes PRE-SURGERY [x]  ENSURE  []  G2  Patient is to be NPO after: 08:45 am  Comments: Patient was given the 5 CHG shower / bath instructions for THA along with 2 bottles of the CHG soap. Patient will start this on: Monday  April 09, 2023     All questions were asked and answered, Patient voiced understanding of this process.   Activity level: Patient is able to climb a flight of stairs without difficulty; [x]  No CP  [x]  No SOB, but would have leg pain.  Patient can perform ADLs without assistance.   Anesthesia review: ADHD, avascular necrosis- current ETOH 10+ drinks q week, Fatty Liver, anemia, Hx Panic Attacks, GERD, OSA- no CPAP  Patient denies shortness of breath, fever, cough and chest pain at PAT appointment.  Patient verbalized understanding and agreement to the Pre-Surgical Instructions that were given to them at this PAT appointment. Patient was also educated of the need to review these PAT instructions again prior  to his surgery.I reviewed the appropriate phone numbers to call if they have any and questions or concerns.

## 2023-04-08 NOTE — Patient Instructions (Addendum)
SURGICAL WAITING ROOM VISITATION Patients having surgery or a procedure may have no more than 2 support people in the waiting area - these visitors may rotate in the visitor waiting room.   Due to an increase in RSV and influenza rates and associated hospitalizations, children ages 12 and under may not visit patients in [facility] hospitals. If the patient needs to stay at the hospital during part of their recovery, the visitor guidelines for inpatient rooms apply.  PRE-OP VISITATION  Pre-op nurse will coordinate an appropriate time for 1 support person to accompany the patient in pre-op.  This support person may not rotate.  This visitor will be contacted when the time is appropriate for the visitor to come back in the pre-op area.  Please refer to the [facility] website for the visitor guidelines for Inpatients (after your surgery is over and you are in a regular room).  You are not required to quarantine at this time prior to your surgery. However, you must do this: Hand Hygiene often Do NOT share personal items Notify your provider if you are in close contact with someone who has COVID or you develop fever 100.4 or greater, new onset of sneezing, cough, sore throat, shortness of breath or body aches.  If you test positive for Covid or have been in contact with anyone that has tested positive in the last 10 days please notify you surgeon.    Your procedure is scheduled on: FRIDAY  April 13, 2023   Report to [facility] Main Entrance: [facility] entrance where the [employer] is available.   Report to admitting at: 09:15    AM  Call this number if you have any questions or problems the morning of surgery [phone]  Do not eat food after Midnight the night prior to your surgery/procedure.  After Midnight you may have the following liquids until  08:45  AM DAY OF SURGERY  Clear Liquid Diet Water Black Coffee (sugar ok, NO MILK/CREAM OR CREAMERS)  Tea (sugar ok, NO  MILK/CREAM OR CREAMERS) regular and decaf                             Plain Jell-O  with no fruit (NO RED)                                           Fruit ices (not with fruit pulp, NO RED)                                     Popsicles (NO RED)                                                                  Juice: NO CITRUS JUICES: only apple, WHITE grape, WHITE cranberry Sports drinks like Gatorade or Powerade (NO RED)                   The day of surgery:  Drink ONE (1) Pre-Surgery Clear Ensure at  08:45  AM the morning of  surgery. Drink in one sitting. Do not sip.  This drink was given to you during your hospital pre-op appointment visit. Nothing else to drink after completing the Pre-Surgery Clear Ensure  : No candy, chewing gum or throat lozenges.    FOLLOW ANY ADDITIONAL PRE OP INSTRUCTIONS YOU RECEIVED FROM YOUR SURGEON'S OFFICE!!!   Oral Hygiene is also important to reduce your risk of infection.        Remember - BRUSH YOUR TEETH THE MORNING OF SURGERY WITH YOUR REGULAR TOOTHPASTE  Do NOT smoke after Midnight the night before surgery.  Take ONLY these medicines the morning of surgery with A SIP OF WATER: Fenofibrate                     You may not have any metal on your body including  jewelry, and body piercing  Do not wear  lotions, powders,cologne, or deodorant  Men may shave face and neck.  Contacts, Hearing Aids, dentures or bridgework may not be worn into surgery. DENTURES WILL BE REMOVED PRIOR TO SURGERY PLEASE DO NOT APPLY "Poly grip" OR ADHESIVES!!!  You may bring a small overnight bag with you on the day of surgery, only pack items that are not valuable. [facility] IS NOT RESPONSIBLE   FOR VALUABLES THAT ARE LOST OR STOLEN.   Do not bring your home medications to the hospital. The Pharmacy will dispense medications listed on your medication list to you during your admission in the Hospital.  Special Instructions: Bring a copy of your healthcare power of  attorney and living will documents the day of surgery, if you wish to have them scanned into your [facility] Records- EPIC  Please read over the following fact sheets you were given: IF YOU HAVE QUESTIONS ABOUT YOUR PRE-OP INSTRUCTIONS, PLEASE CALL [phone].         Pre-operative 5 CHG Bath Instructions   You can play a key role in reducing the risk of infection after surgery. Your skin needs to be as free of germs as possible. You can reduce the number of germs on your skin by washing with CHG (chlorhexidine gluconate) soap before surgery. CHG is an antiseptic soap that kills germs and continues to kill germs even after washing.   DO NOT use if you have an allergy to chlorhexidine/CHG or antibacterial soaps. If your skin becomes reddened or irritated, stop using the CHG and notify one of our RNs at [phone]  Please shower with the CHG soap starting 4 days before surgery using the following schedule: START SHOWERS ON MONDAY  April 09, 2023  Please keep in mind the following:  DO NOT shave, including legs and underarms, starting the day of your first shower.   You may shave your face at any point before/day of surgery.   Place clean sheets on your bed the day you start using CHG soap. Use a clean washcloth (not used since being washed) for each shower. DO NOT sleep with pets once you start using the CHG.   CHG Shower Instructions:  If you choose to wash your hair and private area, wash first with your normal shampoo/soap.  After you use shampoo/soap, rinse your hair and body thoroughly to remove shampoo/soap residue.  Turn the water OFF and apply about 3 tablespoons (45 ml) of CHG soap to a CLEAN washcloth.  Apply CHG soap ONLY FROM YOUR NECK DOWN TO YOUR TOES (washing for 3-5 minutes)  DO  NOT use CHG soap on face, private areas, open wounds, or sores.  Pay special attention to the area where your surgery is being performed.  If you are having back surgery, having someone wash your back for you may be helpful.  Wait 2 minutes after CHG soap is applied, then you may rinse off the CHG soap.  Pat dry with a clean towel  Put on clean clothes/pajamas   If you choose to wear lotion, please use ONLY the CHG-compatible lotions on the back of this paper.     Additional instructions for the day of surgery: DO NOT APPLY any lotions, deodorants, cologne, or perfumes.   Put on clean/comfortable clothes.  Brush your teeth.  Ask your nurse before applying any prescription medications to the skin.      CHG Compatible Lotions   Aveeno Moisturizing lotion  Cetaphil Moisturizing Cream  Cetaphil Moisturizing Lotion  Clairol Herbal Essence Moisturizing Lotion, Dry Skin  Clairol Herbal Essence Moisturizing Lotion, Extra Dry Skin  Clairol Herbal Essence Moisturizing Lotion, Normal Skin  Curel Age Defying Therapeutic Moisturizing Lotion with Alpha Hydroxy  Curel Extreme Care Body Lotion  Curel Soothing Hands Moisturizing Hand Lotion  Curel Therapeutic Moisturizing Cream, Fragrance-Free  Curel Therapeutic Moisturizing Lotion, Fragrance-Free  Curel Therapeutic Moisturizing Lotion, Original Formula  Eucerin Daily Replenishing Lotion  Eucerin Dry Skin Therapy Plus Alpha Hydroxy Crme  Eucerin Dry Skin Therapy Plus Alpha Hydroxy Lotion  Eucerin Original Crme  Eucerin Original Lotion  Eucerin Plus Crme Eucerin Plus Lotion  Eucerin TriLipid Replenishing Lotion  Keri Anti-Bacterial Hand Lotion  Keri Deep Conditioning Original Lotion Dry Skin Formula Softly Scented  Keri Deep Conditioning Original Lotion, Fragrance Free Sensitive Skin Formula  Keri Lotion Fast Absorbing Fragrance Free Sensitive Skin Formula  Keri Lotion Fast Absorbing Softly Scented Dry Skin Formula  Keri Original  Lotion  Keri Skin Renewal Lotion Keri Silky Smooth Lotion  Keri Silky Smooth Sensitive Skin Lotion  Nivea Body Creamy Conditioning Oil  Nivea Body Extra Enriched Lotion  Nivea Body Original Lotion  Nivea Body Sheer Moisturizing Lotion Nivea Crme  Nivea Skin Firming Lotion  NutraDerm 30 Skin Lotion  NutraDerm Skin Lotion  NutraDerm Therapeutic Skin Cream  NutraDerm Therapeutic Skin Lotion  ProShield Protective Hand Cream  Provon moisturizing lotion   FAILURE TO FOLLOW THESE INSTRUCTIONS MAY RESULT IN THE CANCELLATION OF YOUR SURGERY  PATIENT SIGNATURE_________________________________  NURSE SIGNATURE__________________________________  ________________________________________________________________________      Rogelia Mire    An incentive spirometer is a tool that can help keep your lungs clear and active. This tool measures how well you are filling your lungs with each breath. Taking long  deep breaths may help reverse or decrease the chance of developing breathing (pulmonary) problems (especially infection) following: A long period of time when you are unable to move or be active. BEFORE THE PROCEDURE  If the spirometer includes an indicator to show your best effort, your nurse or respiratory therapist will set it to a desired goal. If possible, sit up straight or lean slightly forward. Try not to slouch. Hold the incentive spirometer in an upright position. INSTRUCTIONS FOR USE  Sit on the edge of your bed if possible, or sit up as far as you can in bed or on a chair. Hold the incentive spirometer in an upright position. Breathe out normally. Place the mouthpiece in your mouth and seal your lips tightly around it. Breathe in slowly and as deeply as possible, raising the piston or the ball toward the top of the column. Hold your breath for 3-5 seconds or for as long as possible. Allow the piston or ball to fall to the bottom of the column. Remove the mouthpiece  from your mouth and breathe out normally. Rest for a few seconds and repeat Steps 1 through 7 at least 10 times every 1-2 hours when you are awake. Take your time and take a few normal breaths between deep breaths. The spirometer may include an indicator to show your best effort. Use the indicator as a goal to work toward during each repetition. After each set of 10 deep breaths, practice coughing to be sure your lungs are clear. If you have an incision (the cut made at the time of surgery), support your incision when coughing by placing a pillow or rolled up towels firmly against it. Once you are able to get out of bed, walk around indoors and cough well. You may stop using the incentive spirometer when instructed by your caregiver.  RISKS AND COMPLICATIONS Take your time so you do not get dizzy or light-headed. If you are in pain, you may need to take or ask for pain medication before doing incentive spirometry. It is harder to take a deep breath if you are having pain. AFTER USE Rest and breathe slowly and easily. It can be helpful to keep track of a log of your progress. Your caregiver can provide you with a simple table to help with this. If you are using the spirometer at home, follow these instructions: SEEK MEDICAL CARE IF:  You are having difficultly using the spirometer. You have trouble using the spirometer as often as instructed. Your pain medication is not giving enough relief while using the spirometer. You develop fever of 100.5 F (38.1 C) or higher.                                                                                                    SEEK IMMEDIATE MEDICAL CARE IF:  You cough up bloody sputum that had not been present before. You develop fever of 102 F (38.9 C) or greater. You develop worsening pain at or near the incision site. MAKE SURE YOU:  Understand these instructions. Will watch your condition. Will get  help right away if you are not doing well or get  worse. Document Released: 01/22/2007 Document Revised: 12/04/2011 Document Reviewed: 03/25/2007 [facility] Patient Information 2014 [place], [place].      WHAT IS A BLOOD TRANSFUSION? Blood Transfusion Information  A transfusion is the replacement of blood or some of its parts. Blood is made up of multiple cells which provide different functions. Red blood cells carry oxygen and are used for blood loss replacement. White blood cells fight against infection. Platelets control bleeding. Plasma helps clot blood. Other blood products are available for specialized needs, such as hemophilia or other clotting disorders. BEFORE THE TRANSFUSION  Who gives blood for transfusions?  Healthy volunteers who are fully evaluated to make sure their blood is safe. This is blood bank blood. Transfusion therapy is the safest it has ever been in the practice of medicine. Before blood is taken from a donor, a complete history is taken to make sure that person has no history of diseases nor engages in risky social behavior (examples are intravenous drug use or sexual activity with multiple partners). The donor's travel history is screened to minimize risk of transmitting infections, such as malaria. The donated blood is tested for signs of infectious diseases, such as HIV and hepatitis. The blood is then tested to be sure it is compatible with you in order to minimize the chance of a transfusion reaction. If you or a relative donates blood, this is often done in anticipation of surgery and is not appropriate for emergency situations. It takes many days to process the donated blood. RISKS AND COMPLICATIONS Although transfusion therapy is very safe and saves many lives, the main dangers of transfusion include:  Getting an infectious disease. Developing a transfusion reaction. This is an allergic reaction to something in the blood you were given. Every precaution is taken to prevent this. The decision to have a blood  transfusion has been considered carefully by your caregiver before blood is given. Blood is not given unless the benefits outweigh the risks. AFTER THE TRANSFUSION Right after receiving a blood transfusion, you will usually feel much better and more energetic. This is especially true if your red blood cells have gotten low (anemic). The transfusion raises the level of the red blood cells which carry oxygen, and this usually causes an energy increase. The nurse administering the transfusion will monitor you carefully for complications. HOME CARE INSTRUCTIONS  No special instructions are needed after a transfusion. You may find your energy is better. Speak with your caregiver about any limitations on activity for underlying diseases you may have. SEEK MEDICAL CARE IF:  Your condition is not improving after your transfusion. You develop redness or irritation at the intravenous (IV) site. SEEK IMMEDIATE MEDICAL CARE IF:  Any of the following symptoms occur over the next 12 hours: Shaking chills. You have a temperature by mouth above 102 F (38.9 C), not controlled by medicine. Chest, back, or muscle pain. People around you feel you are not acting correctly or are confused. Shortness of breath or difficulty breathing. Dizziness and fainting. You get a rash or develop hives. You have a decrease in urine output. Your urine turns a dark color or changes to pink, red, or brown. Any of the following symptoms occur over the next 10 days: You have a temperature by mouth above 102 F (38.9 C), not controlled by medicine. Shortness of breath. Weakness after normal activity. The white part of the eye turns yellow (jaundice). You have a decrease in  the amount of urine or are urinating less often. Your urine turns a dark color or changes to pink, red, or brown. Document Released: 09/08/2000 Document Revised: 12/04/2011 Document Reviewed: 04/27/2008 [facility] Patient Information 2014 [place],  [place].  _______________________________________________________________________

## 2023-04-09 ENCOUNTER — Other Ambulatory Visit: Payer: Self-pay

## 2023-04-09 ENCOUNTER — Encounter (HOSPITAL_COMMUNITY): Payer: Self-pay

## 2023-04-09 ENCOUNTER — Encounter (HOSPITAL_COMMUNITY)
Admission: RE | Admit: 2023-04-09 | Discharge: 2023-04-09 | Disposition: A | Discharge status: Home / Self Care | Payer: 59 | Source: Ambulatory Visit | Attending: Orthopaedic Surgery | Admitting: Orthopaedic Surgery

## 2023-04-09 VITALS — BP 132/91 | HR 83 | Temp 98.6°F | Resp 18 | Ht 73.5 in | Wt 224.0 lb

## 2023-04-09 DIAGNOSIS — Z01818 Encounter for other preprocedural examination: Secondary | ICD-10-CM | POA: Insufficient documentation

## 2023-04-09 DIAGNOSIS — K769 Liver disease, unspecified: Secondary | ICD-10-CM | POA: Insufficient documentation

## 2023-04-09 DIAGNOSIS — M87052 Idiopathic aseptic necrosis of left femur: Secondary | ICD-10-CM | POA: Insufficient documentation

## 2023-04-09 HISTORY — DX: Unspecified osteoarthritis, unspecified site: M19.90

## 2023-04-09 HISTORY — DX: Idiopathic aseptic necrosis of unspecified femur: M87.059

## 2023-04-09 LAB — CBC
HCT: 43.3 % (ref 39.0–52.0)
Hemoglobin: 14.4 g/dL (ref 13.0–17.0)
MCH: 31 pg (ref 26.0–34.0)
MCHC: 33.3 g/dL (ref 30.0–36.0)
MCV: 93.3 fL (ref 80.0–100.0)
Platelets: 132 10*3/uL — ABNORMAL LOW (ref 150–400)
RBC: 4.64 MIL/uL (ref 4.22–5.81)
RDW: 13.2 % (ref 11.5–15.5)
WBC: 5.5 10*3/uL (ref 4.0–10.5)
nRBC: 0 % (ref 0.0–0.2)

## 2023-04-09 LAB — COMPREHENSIVE METABOLIC PANEL
ALT: 34 U/L (ref 0–44)
AST: 31 U/L (ref 15–41)
Albumin: 4.3 g/dL (ref 3.5–5.0)
Alkaline Phosphatase: 118 U/L (ref 38–126)
Anion gap: 8 (ref 5–15)
BUN: 16 mg/dL (ref 6–20)
CO2: 24 mmol/L (ref 22–32)
Calcium: 9.1 mg/dL (ref 8.9–10.3)
Chloride: 104 mmol/L (ref 98–111)
Creatinine, Ser: 0.82 mg/dL (ref 0.61–1.24)
GFR, Estimated: >60 mL/min (ref >60)
Glucose, Bld: 120 mg/dL — ABNORMAL HIGH (ref 70–99)
Potassium: 3.9 mmol/L (ref 3.5–5.1)
Sodium: 136 mmol/L (ref 135–145)
Total Bilirubin: 0.9 mg/dL (ref 0.3–1.2)
Total Protein: 8.1 g/dL (ref 6.5–8.1)

## 2023-04-10 ENCOUNTER — Encounter: Payer: Self-pay | Admitting: Orthopaedic Surgery

## 2023-04-10 LAB — SURGICAL PCR SCREEN
MRSA, PCR: POSITIVE — AB
Staphylococcus aureus: POSITIVE — AB

## 2023-04-10 NOTE — Progress Notes (Signed)
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  Appropriate notes have been placed on the patient's chart. This note has been routed to Dr.Blackman and Richardean Canal, PA for review. The Patient's surgery is currently scheduled for: 04-13-2023  at [facility].  Rudean Haskell, BSN, CVRN-BC   Pre-Surgical Testing Nurse [facility]  [phone]

## 2023-04-11 ENCOUNTER — Encounter: Payer: Self-pay | Admitting: Orthopaedic Surgery

## 2023-04-12 ENCOUNTER — Telehealth: Payer: Self-pay | Admitting: *Deleted

## 2023-04-12 NOTE — Telephone Encounter (Signed)
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 Call to [facility] and made referral. Referral accepted. Spoke with [facility] and informed that referral was made and patient prefers [place]. Patient is aware this has been changed.

## 2023-04-12 NOTE — H&P (Signed)
TOTAL HIP ADMISSION H&P  Patient is admitted for left total hip arthroplasty.  Subjective:  Chief Complaint: left hip pain  HPI: Connor Martin, 47 y.o. male, has a history of pain and functional disability in the left hip(s) due to  avascular necrosis  and patient has failed non-surgical conservative treatments for greater than 12 weeks to include NSAID's and/or analgesics and activity modification.  Onset of symptoms was abrupt starting 1 years ago with rapidlly worsening course since that time.The patient noted no past surgery on the left hip(s).  Patient currently rates pain in the left hip at 10 out of 10 with activity. Patient has night pain, worsening of pain with activity and weight bearing, and pain that interfers with activities of daily living. Patient has evidence of subchondral cysts by imaging studies. This condition presents safety issues increasing the risk of falls. This patient has had avascular necrosis of the hip, acetabular fracture, hip dysplasia.  There is no current active infection.  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   Adjustment disorder with depressed mood 02/25/2020   Nutritional anemia 02/25/2020   Fatty liver 09/02/2016   History of panic attacks 08/30/2016   GAD (generalized anxiety disorder) 08/30/2016   Gastroesophageal reflux disease without esophagitis    High blood triglycerides    Pancreatitis 04/07/2016   Abnormal LFTs 04/06/2016   Alcohol use 04/06/2016   Cough 04/06/2016   Pancreatitis, acute 04/06/2016   Panic attacks    GERD (gastroesophageal reflux disease)    Past Medical History:  Diagnosis Date   Acute pancreatitis hospitalized 04/06/2016   Acute sinusitis    ADD (attention deficit disorder)    Anxiety    Arthritis    Avascular  necrosis of bone of hip (HCC)    Blood in stool    Bronchitis    Chronic lower back pain    Depression    Dyslipidemia    Family history of colon cancer    Fatty liver    GERD (gastroesophageal reflux disease)    Headache    "@ least once/week" (04/07/2016)   Hypertriglyceridemia    Hypertriglyceridemia    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    Migraine    "q other month" (04/07/2016)   Nasal congestion    Palpitations    Pancreatitis    Panic attacks    Pneumonia ~ 1981   Sleep apnea    Snoring     Past Surgical History:  Procedure Laterality Date   TONSILLECTOMY AND ADENOIDECTOMY  ~ 1990   TOTAL HIP ARTHROPLASTY Right 12/29/2022   Procedure: RIGHT TOTAL HIP ARTHROPLASTY ANTERIOR APPROACH;  Surgeon: Kathryne Hitch, MD;  Location: [facility];  Service: Orthopedics;  Laterality: Right;    No current facility-administered medications for this encounter.   Current Outpatient Medications  Medication Sig Dispense Refill Last Dose   ALPRAZolam (XANAX) 0.5 MG tablet Take 1 tablet (0.5 mg total) by mouth 2 (two) times daily as needed. 40 tablet 2    amphetamine-dextroamphetamine (ADDERALL) 20 MG tablet Take 1 tablet (20 mg total) by mouth 3 (three) times daily. 90 tablet 0    betamethasone dipropionate (DIPROLENE) 0.05 % ointment Apply a thin layer to affected area(s) twice daily. 30 g 0    Biotin 1 MG CAPS Take  1 mg by mouth daily at 12 noon.      esomeprazole (NEXIUM) 40 MG capsule take 1 capsule by oral route  every day 90 capsule 3    fenofibrate 160 MG tablet TAKE 1 TABLET BY MOUTH EVERY DAY 90 tablet 3    imiquimod (ALDARA) 5 % cream Apply topically 5 nights per week, use occlusive dressing, wash off after 8-10 hours. 24 each 2    levocetirizine (XYZAL) 5 MG tablet Take 1 tablet (5 mg total) by mouth every evening. 90 tablet 1    magnesium oxide (MAG-OX) 400 MG tablet Take 400 mg by mouth daily.      Milk Thistle 200 MG CAPS Take 200 mg by mouth daily.      Multiple  Vitamin (MULTIVITAMIN WITH MINERALS) TABS tablet Take 1 tablet by mouth daily.      omega-3 acid ethyl esters (LOVAZA) 1 g capsule take 2 capsule by oral route 2 times every day 360 capsule 1    oxyCODONE-acetaminophen (PERCOCET) 7.5-325 MG tablet Take 1 tablet by mouth every 12 (twelve) hours as needed. 60 tablet 0    Rimegepant Sulfate (NURTEC) 75 MG TBDP Take 75 mg by mouth as needed. 8 tablet 6    vitamin E 1000 UNIT capsule Take 1,000 Units by mouth daily.      Zinc Gluconate 100 MG TABS Take 100 mg by mouth daily.      albuterol (VENTOLIN HFA) 108 (90 Base) MCG/ACT inhaler Inhale 2 puffs into the lungs every 4 (four) hours as needed for difficulty breath. (Patient not taking: Reported on 12/13/2022) 6.7 g 0 Not Taking   aspirin 81 MG chewable tablet Chew 1 tablet (81 mg total) by mouth 2 (two) times daily. (Patient not taking: Reported on 04/09/2023) 90 tablet 0 Not Taking   cyclobenzaprine (FLEXERIL) 10 MG tablet Take 1 tablet (10 mg total) by mouth 3 (three) times daily as needed for muscle spasms. (Patient not taking: Reported on 04/09/2023) 60 tablet 0 Not Taking   fluticasone (FLONASE) 50 MCG/ACT nasal spray Place 2 sprays into both nostrils daily. (Patient not taking: Reported on 12/13/2022) 16 g 2 Not Taking   fluticasone (FLOVENT HFA) 110 MCG/ACT inhaler Inhale 2 puffs into the lungs in the morning and at bedtime. (Patient not taking: Reported on 12/13/2022) 1 each 12 Not Taking   gabapentin (NEURONTIN) 100 MG capsule Take 1 capsule (100 mg total) by mouth 3 (three) times daily as needed (for burning pain). (Patient not taking: Reported on 04/09/2023) 60 capsule 0 Not Taking   HYDROmorphone (DILAUDID) 4 MG tablet Take 1 tablet (4 mg total) by mouth every 4 (four) hours as needed for severe pain (pain score 7-10). (Patient not taking: Reported on 04/09/2023) 30 tablet 0 Not Taking   Allergies  Allergen Reactions   Diphenhydramine Hives and Palpitations    Unsure, SOB  Other Reaction(s):  Other (See Comments)  Unknown    Unsure, SOB    Unknown "increased heart rate"    unsure   Latex Other (See Comments)    Does not prefer  Does not prefer Does not prefer    Does not prefer   Sumatriptan Swelling    Brain  Brain Brain    Brain    Social History   Tobacco Use   Smoking status: Never   Smokeless tobacco: Never  Substance Use Topics   Alcohol use: Yes    Alcohol/week: 10.0 standard drinks of alcohol    Types: 10 Shots of  liquor per week    Comment: social    Family History  Problem Relation Age of Onset   Hypothyroidism Mother    Heart disease Mother    Diabetes Mother    ALS Father      Review of Systems  All other systems reviewed and are negative.   Objective:  Physical Exam Vitals reviewed.  Constitutional:      Appearance: Normal appearance. He is normal weight.  HENT:     Head: Normocephalic and atraumatic.  Eyes:     Extraocular Movements: Extraocular movements intact.     Pupils: Pupils are equal, round, and reactive to light.  Cardiovascular:     Rate and Rhythm: Normal rate and regular rhythm.     Pulses: Normal pulses.  Pulmonary:     Effort: Pulmonary effort is normal.     Breath sounds: Normal breath sounds.  Abdominal:     Palpations: Abdomen is soft.  Musculoskeletal:     Cervical back: Normal range of motion and neck supple.     Left hip: Tenderness and bony tenderness present.  Neurological:     Mental Status: He is alert and oriented to person, place, and time.  Psychiatric:        Behavior: Behavior normal.     Vital signs in last 24 hours:    Labs:   Estimated body mass index is 29.15 kg/m as calculated from the following:   Height as of 04/09/23: 6' 1.5" (1.867 m).   Weight as of 04/09/23: 101.6 kg.   Imaging Review Plain radiographs demonstrate severe AVN of the left hip(s). The bone quality appears to be excellent for age and reported activity level.      Assessment/Plan:  Avascular  necrosis, left hip(s)  The patient history, physical examination, clinical judgement of the provider and imaging studies are consistent with AVN of the left hip(s) and total hip arthroplasty is deemed medically necessary. The treatment options including medical management, injection therapy, arthroscopy and arthroplasty were discussed at length. The risks and benefits of total hip arthroplasty were presented and reviewed. The risks due to aseptic loosening, infection, stiffness, dislocation/subluxation,  thromboembolic complications and other imponderables were discussed.  The patient acknowledged the explanation, agreed to proceed with the plan and consent was signed. Patient is being admitted for inpatient treatment for surgery, pain control, PT, OT, prophylactic antibiotics, VTE prophylaxis, progressive ambulation and ADL's and discharge planning.The patient is planning to be discharged home with home health services

## 2023-04-13 DIAGNOSIS — M87052 Idiopathic aseptic necrosis of left femur: Secondary | ICD-10-CM

## 2023-04-13 LAB — TYPE AND SCREEN
ABO/RH(D): A POS
Antibody Screen: NEGATIVE

## 2023-04-13 NOTE — Telephone Encounter (Signed)
Spoke with patient and also sent another MyChart message today.

## 2023-04-16 ENCOUNTER — Encounter: Payer: Self-pay | Admitting: Orthopaedic Surgery

## 2023-04-19 ENCOUNTER — Ambulatory Visit: Payer: 59 | Admitting: Physician Assistant

## 2023-04-19 ENCOUNTER — Ambulatory Visit (INDEPENDENT_AMBULATORY_CARE_PROVIDER_SITE_OTHER): Payer: 59 | Admitting: Physician Assistant

## 2023-04-19 ENCOUNTER — Encounter: Payer: Self-pay | Admitting: Physician Assistant

## 2023-04-19 ENCOUNTER — Other Ambulatory Visit: Payer: Self-pay

## 2023-04-19 DIAGNOSIS — M25532 Pain in left wrist: Secondary | ICD-10-CM | POA: Diagnosis not present

## 2023-04-19 NOTE — Progress Notes (Signed)
Office Visit Note   Patient: Connor Martin           Date of Birth: 1976-01-10           MRN: [id number] Visit Date: 04/19/2023              Requested by: Kerry Fort, MD [address] [place],  [place] [id number] PCP: Kerry Fort, MD  Chief Complaint  Patient presents with   Left Wrist - Pain      HPI: Connor Martin is a pleasant 47 year old gentleman with a 1 year history of chronic left wrist pain.  He is right-hand dominant.  He does not recall any specific injury.  He does have a history of avascular necrosis in both of his hips and is due for his second hip replacement next week with Dr. Magnus Ivan.  He denies any fever or chills.  Points to most of his pain in the distal radius.  He has been able to play a little bit more golf which is aggravated somewhat more.  Assessment & Plan: Visit Diagnoses:  1. Pain in left wrist     Plan: 1 year history of chronic wrist pain.  He does have a few cysts at the distal radius as well as a linear line without a history of any kind of fracture that he knows of.  Given his history and the chronicity of this problem we will I think it could very well be de Quervain's I would like to confirm there is no evidence of any other pathology with an MRI.  Can follow-up with Dr. Magnus Ivan or Delane Ginger  Follow-Up Instructions: Return with gil or Dr. Magnus Ivan after MRI.   Ortho Exam  Patient is alert, oriented, no adenopathy, well-dressed, normal affect, normal respiratory effort. Examination of his left wrist he is neurovascular intact strong radial pulse has good opposition no tenderness at the [facility] joints grip strength is intact no paresthesias.  He does have some tenderness over the first dorsal compartment with just very mild if any increase in pain with Finkelstein's testing.  No cellulitis  Imaging: No results found. No images are attached to the encounter.  Labs: Lab Results  Component Value Date   HGBA1C 5.5 04/17/2022   HGBA1C 5.4  04/07/2016     Lab Results  Component Value Date   ALBUMIN 4.3 04/09/2023   ALBUMIN 4.4 12/18/2022   ALBUMIN 4.2 04/07/2022    Lab Results  Component Value Date   MG 2.1 04/10/2016   No results found for: "VD25OH"  No results found for: "PREALBUMIN"    Latest Ref Rng & Units 04/09/2023    2:41 PM 12/30/2022    4:05 AM 12/29/2022    7:15 AM  CBC EXTENDED  WBC 4.0 - 10.5 K/uL 5.5  9.8  5.0   RBC 4.22 - 5.81 MIL/uL 4.64  3.45  4.03   Hemoglobin 13.0 - 17.0 g/dL 78.2  95.6  21.3   HCT 39.0 - 52.0 % 43.3  33.1  38.1   Platelets 150 - 400 K/uL 132  113  116      There is no height or weight on file to calculate BMI.  Orders:  Orders Placed This Encounter  Procedures   XR Wrist Complete Left   MR Wrist Left w/o contrast   No orders of the defined types were placed in this encounter.    Procedures: No procedures performed  Clinical Data: No additional findings.  ROS:  All other systems negative,  except as noted in the HPI. Review of Systems  Objective: Vital Signs: There were no vitals taken for this visit.  Specialty Comments:  No specialty comments available.  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   Adjustment disorder with depressed mood 02/25/2020   Nutritional anemia 02/25/2020   Fatty liver 09/02/2016   History of panic attacks 08/30/2016   GAD (generalized anxiety disorder) 08/30/2016   Gastroesophageal reflux disease without esophagitis    High blood triglycerides    Pancreatitis 04/07/2016   Abnormal LFTs 04/06/2016   Alcohol use 04/06/2016   Cough 04/06/2016   Pancreatitis, acute 04/06/2016   Panic attacks    GERD (gastroesophageal reflux disease)    Past Medical History:  Diagnosis Date   Acute pancreatitis hospitalized  04/06/2016   Acute sinusitis    ADD (attention deficit disorder)    Anxiety    Arthritis    Avascular necrosis of bone of hip (HCC)    Blood in stool    Bronchitis    Chronic lower back pain    Depression    Dyslipidemia    Family history of colon cancer    Fatty liver    GERD (gastroesophageal reflux disease)    Headache    "@ least once/week" (04/07/2016)   Hypertriglyceridemia    Hypertriglyceridemia    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    Migraine    "q other month" (04/07/2016)   Nasal congestion    Palpitations    Pancreatitis    Panic attacks    Pneumonia ~ 1981   Sleep apnea    Snoring     Family History  Problem Relation Age of Onset   Hypothyroidism Mother    Heart disease Mother    Diabetes Mother    ALS Father     Past Surgical History:  Procedure Laterality Date   TONSILLECTOMY AND ADENOIDECTOMY  ~ 1990   TOTAL HIP ARTHROPLASTY Right 12/29/2022   Procedure: RIGHT TOTAL HIP ARTHROPLASTY ANTERIOR APPROACH;  Surgeon: Kathryne Hitch, MD;  Location: [facility];  Service: Orthopedics;  Laterality: Right;   Social History   Occupational History    Comment: Merchandiser, retail  Tobacco Use   Smoking status: Never   Smokeless tobacco: Never  Vaping Use   Vaping status: Never Used  Substance and Sexual Activity   Alcohol use: Yes    Alcohol/week: 10.0 standard drinks of alcohol    Types: 10 Shots of liquor per week    Comment: social   Drug use: No   Sexual activity: Yes

## 2023-04-20 ENCOUNTER — Ambulatory Visit: Payer: 59 | Admitting: Physician Assistant

## 2023-04-22 ENCOUNTER — Other Ambulatory Visit (HOSPITAL_BASED_OUTPATIENT_CLINIC_OR_DEPARTMENT_OTHER): Payer: Self-pay

## 2023-04-22 ENCOUNTER — Other Ambulatory Visit: Payer: Self-pay | Admitting: Psychiatry

## 2023-04-23 ENCOUNTER — Other Ambulatory Visit: Payer: Self-pay

## 2023-04-23 ENCOUNTER — Other Ambulatory Visit (HOSPITAL_BASED_OUTPATIENT_CLINIC_OR_DEPARTMENT_OTHER): Payer: Self-pay

## 2023-04-23 NOTE — Progress Notes (Signed)
Patient phoned to give updated information on surgery. Surgery was rescheduled due to computer outage.    Date of Surgery - 05-19-23  Arrival Time - 12:00 PM and check in at admitting.    NPO Status - patient reminded to not eat solid food after midnight.  From midnight until 11:30 may have clear liquids  Medications morning of surgery - Fenofibrate. Nexium, Oxycodone, Alprazolam if needed.    No change in medical history, allergies per patient.  Transportation home - patient is an overnight stay  All questions answered and patient stated understanding

## 2023-04-23 NOTE — Patient Instructions (Addendum)
SURGICAL WAITING ROOM VISITATION Patients having surgery or a procedure may have no more than 2 support people in the waiting area - these visitors may rotate.    Children under the age of 100 must have an adult with them who is not the patient.  If the patient needs to stay at the hospital during part of their recovery, the visitor guidelines for inpatient rooms apply. Pre-op nurse will coordinate an appropriate time for 1 support person to accompany patient in pre-op.  This support person may not rotate.    Please refer to the [facility] website for the visitor guidelines for Inpatients (after your surgery is over and you are in a regular room).       Your procedure is scheduled on: 04-27-23   Report to [facility] Main Entrance    Report to admitting at 12:00 PM   Call this number if you have problems the morning of surgery [phone]   Do not eat food :After Midnight.   After Midnight you may have the following liquids until 11:30 AM DAY OF SURGERY  Water Non-Citrus Juices (without pulp, NO RED-Apple, White grape, White cranberry) Black Coffee (NO MILK/CREAM OR CREAMERS, sugar ok)  Clear Tea (NO MILK/CREAM OR CREAMERS, sugar ok) regular and decaf                             Plain Jell-O (NO RED)                                           Fruit ices (not with fruit pulp, NO RED)                                     Popsicles (NO RED)                                                               Sports drinks like Gatorade (NO RED)                   The day of surgery:  Drink ONE (1) Pre-Surgery Clear Ensure at 11:30 AM the morning of surgery. Drink in one sitting. Do not sip.  This drink was given to you during your hospital  pre-op appointment visit. Nothing else to drink after completing the Pre-Surgery Clear Ensure.          If you have questions, please contact your surgeon's office.   FOLLOW  ANY ADDITIONAL PRE OP INSTRUCTIONS YOU RECEIVED FROM YOUR  SURGEON'S OFFICE!!!     Oral Hygiene is also important to reduce your risk of infection.                                    Remember - BRUSH YOUR TEETH THE MORNING OF SURGERY WITH YOUR REGULAR TOOTHPASTE   Do NOT smoke after Midnight   Take these medicines the morning of surgery with A SIP OF WATER:   Fenofibrate  Nexium  Alprazolam  if needed  Oxycodone if needed  Stop all vitamins and herbal supplements 7 days before surgery                              You may not have any metal on your body including  jewelry, and body piercing             Do not wear  lotions, powders, cologne, or deodorant              Men may shave face and neck.   Do not bring valuables to the hospital. [place] IS NOT RESPONSIBLE   FOR VALUABLES.   Contacts, dentures or bridgework may not be worn into surgery.   Bring small overnight bag day of surgery.   DO NOT BRING YOUR HOME MEDICATIONS TO THE HOSPITAL. PHARMACY WILL DISPENSE MEDICATIONS LISTED ON YOUR MEDICATION LIST TO YOU DURING YOUR ADMISSION IN THE HOSPITAL!   Special Instructions: Bring a copy of your healthcare power of attorney and living will documents the day of surgery if you haven't scanned them before.              Please read over the following fact sheets you were given: IF YOU HAVE QUESTIONS ABOUT YOUR PRE-OP INSTRUCTIONS PLEASE CALL [phone]  If you received a COVID test during your pre-op visit  it is requested that you wear a mask when out in public, stay away from anyone that may not be feeling well and notify your surgeon if you develop symptoms. If you test positive for Covid or have been in contact with anyone that has tested positive in the last 10 days please notify you surgeon.    Pre-operative 5 CHG Bath Instructions   You can play a key role in reducing the risk of infection after surgery. Your skin needs to be as free of germs as possible. You can reduce the number of germs on your skin by washing with CHG  (chlorhexidine gluconate) soap before surgery. CHG is an antiseptic soap that kills germs and continues to kill germs even after washing.   DO NOT use if you have an allergy to chlorhexidine/CHG or antibacterial soaps. If your skin becomes reddened or irritated, stop using the CHG and notify one of our RNs at  [phone] .   Please shower with the CHG soap starting 4 days before surgery using the following schedule:     Please keep in mind the following:  DO NOT shave, including legs and underarms, starting the day of your first shower.   You may shave your face at any point before/day of surgery.  Place clean sheets on your bed the day you start using CHG soap. Use a clean washcloth (not used since being washed) for each shower. DO NOT sleep with pets once you start using the CHG.   CHG Shower Instructions:  If you choose to wash your hair and private area, wash first with your normal shampoo/soap.  After you use shampoo/soap, rinse your hair and body thoroughly to remove shampoo/soap residue.  Turn the water OFF and apply about 3 tablespoons (45 ml) of CHG soap to a CLEAN washcloth.  Apply CHG soap ONLY FROM YOUR NECK DOWN TO YOUR TOES (washing for 3-5 minutes)  DO NOT use CHG soap on face, private areas, open wounds, or sores.  Pay special attention to the area where your surgery is being performed.  If you are having  back surgery, having someone wash your back for you may be helpful. Wait 2 minutes after CHG soap is applied, then you may rinse off the CHG soap.  Pat dry with a clean towel  Put on clean clothes/pajamas   If you choose to wear lotion, please use ONLY the CHG-compatible lotions on the back of this paper.     Additional instructions for the day of surgery: DO NOT APPLY any lotions, deodorants, cologne, or perfumes.   Put on clean/comfortable clothes.  Brush your teeth.  Ask your nurse before applying any prescription medications to the skin.      CHG  Compatible Lotions   Aveeno Moisturizing lotion  Cetaphil Moisturizing Cream  Cetaphil Moisturizing Lotion  Clairol Herbal Essence Moisturizing Lotion, Dry Skin  Clairol Herbal Essence Moisturizing Lotion, Extra Dry Skin  Clairol Herbal Essence Moisturizing Lotion, Normal Skin  Curel Age Defying Therapeutic Moisturizing Lotion with Alpha Hydroxy  Curel Extreme Care Body Lotion  Curel Soothing Hands Moisturizing Hand Lotion  Curel Therapeutic Moisturizing Cream, Fragrance-Free  Curel Therapeutic Moisturizing Lotion, Fragrance-Free  Curel Therapeutic Moisturizing Lotion, Original Formula  Eucerin Daily Replenishing Lotion  Eucerin Dry Skin Therapy Plus Alpha Hydroxy Crme  Eucerin Dry Skin Therapy Plus Alpha Hydroxy Lotion  Eucerin Original Crme  Eucerin Original Lotion  Eucerin Plus Crme Eucerin Plus Lotion  Eucerin TriLipid Replenishing Lotion  Keri Anti-Bacterial Hand Lotion  Keri Deep Conditioning Original Lotion Dry Skin Formula Softly Scented  Keri Deep Conditioning Original Lotion, Fragrance Free Sensitive Skin Formula  Keri Lotion Fast Absorbing Fragrance Free Sensitive Skin Formula  Keri Lotion Fast Absorbing Softly Scented Dry Skin Formula  Keri Original Lotion  Keri Skin Renewal Lotion Keri Silky Smooth Lotion  Keri Silky Smooth Sensitive Skin Lotion  Nivea Body Creamy Conditioning Oil  Nivea Body Extra Enriched Lotion  Nivea Body Original Lotion  Nivea Body Sheer Moisturizing Lotion Nivea Crme  Nivea Skin Firming Lotion  NutraDerm 30 Skin Lotion  NutraDerm Skin Lotion  NutraDerm Therapeutic Skin Cream  NutraDerm Therapeutic Skin Lotion  ProShield Protective Hand Cream  Provon moisturizing lotion   ________________________________________________________________________    Incentive Spirometer  An incentive spirometer is a tool that can help keep your lungs clear and active. This tool measures how well you are filling your lungs with each breath. Taking long  deep breaths may help reverse or decrease the chance of developing breathing (pulmonary) problems (especially infection) following: A long period of time when you are unable to move or be active. BEFORE THE PROCEDURE  If the spirometer includes an indicator to show your best effort, your nurse or respiratory therapist will set it to a desired goal. If possible, sit up straight or lean slightly forward. Try not to slouch. Hold the incentive spirometer in an upright position. INSTRUCTIONS FOR USE  Sit on the edge of your bed if possible, or sit up as far as you can in bed or on a chair. Hold the incentive spirometer in an upright position. Breathe out normally. Place the mouthpiece in your mouth and seal your lips tightly around it. Breathe in slowly and as deeply as possible, raising the piston or the ball toward the top of the column. Hold your breath for 3-5 seconds or for as long as possible. Allow the piston or ball to fall to the bottom of the column. Remove the mouthpiece from your mouth and breathe out normally. Rest for a few seconds and repeat Steps 1 through 7 at least 10 times every  1-2 hours when you are awake. Take your time and take a few normal breaths between deep breaths. The spirometer may include an indicator to show your best effort. Use the indicator as a goal to work toward during each repetition. After each set of 10 deep breaths, practice coughing to be sure your lungs are clear. If you have an incision (the cut made at the time of surgery), support your incision when coughing by placing a pillow or rolled up towels firmly against it. Once you are able to get out of bed, walk around indoors and cough well. You may stop using the incentive spirometer when instructed by your caregiver.  RISKS AND COMPLICATIONS Take your time so you do not get dizzy or light-headed. If you are in pain, you may need to take or ask for pain medication before doing incentive spirometry. It is  harder to take a deep breath if you are having pain. AFTER USE Rest and breathe slowly and easily. It can be helpful to keep track of a log of your progress. Your caregiver can provide you with a simple table to help with this. If you are using the spirometer at home, follow these instructions: SEEK MEDICAL CARE IF:  You are having difficultly using the spirometer. You have trouble using the spirometer as often as instructed. Your pain medication is not giving enough relief while using the spirometer. You develop fever of 100.5 F (38.1 C) or higher. SEEK IMMEDIATE MEDICAL CARE IF:  You cough up bloody sputum that had not been present before. You develop fever of 102 F (38.9 C) or greater. You develop worsening pain at or near the incision site. MAKE SURE YOU:  Understand these instructions. Will watch your condition. Will get help right away if you are not doing well or get worse. Document Released: 01/22/2007 Document Revised: 12/04/2011 Document Reviewed: 03/25/2007 Exit[employer] Patient Information 2014 Exit[employer], [place].   ________________________________________________________________________ WHAT IS A BLOOD TRANSFUSION? Blood Transfusion Information  A transfusion is the replacement of blood or some of its parts. Blood is made up of multiple cells which provide different functions. Red blood cells carry oxygen and are used for blood loss replacement. White blood cells fight against infection. Platelets control bleeding. Plasma helps clot blood. Other blood products are available for specialized needs, such as hemophilia or other clotting disorders. BEFORE THE TRANSFUSION  Who gives blood for transfusions?  Healthy volunteers who are fully evaluated to make sure their blood is safe. This is blood bank blood. Transfusion therapy is the safest it has ever been in the practice of medicine. Before blood is taken from a donor, a complete history is taken to make sure that person has  no history of diseases nor engages in risky social behavior (examples are intravenous drug use or sexual activity with multiple partners). The donor's travel history is screened to minimize risk of transmitting infections, such as malaria. The donated blood is tested for signs of infectious diseases, such as HIV and hepatitis. The blood is then tested to be sure it is compatible with you in order to minimize the chance of a transfusion reaction. If you or a relative donates blood, this is often done in anticipation of surgery and is not appropriate for emergency situations. It takes many days to process the donated blood. RISKS AND COMPLICATIONS Although transfusion therapy is very safe and saves many lives, the main dangers of transfusion include:  Getting an infectious disease. Developing a transfusion reaction. This is an allergic  reaction to something in the blood you were given. Every precaution is taken to prevent this. The decision to have a blood transfusion has been considered carefully by your caregiver before blood is given. Blood is not given unless the benefits outweigh the risks. AFTER THE TRANSFUSION Right after receiving a blood transfusion, you will usually feel much better and more energetic. This is especially true if your red blood cells have gotten low (anemic). The transfusion raises the level of the red blood cells which carry oxygen, and this usually causes an energy increase. The nurse administering the transfusion will monitor you carefully for complications. HOME CARE INSTRUCTIONS  No special instructions are needed after a transfusion. You may find your energy is better. Speak with your caregiver about any limitations on activity for underlying diseases you may have. SEEK MEDICAL CARE IF:  Your condition is not improving after your transfusion. You develop redness or irritation at the intravenous (IV) site. SEEK IMMEDIATE MEDICAL CARE IF:  Any of the following symptoms  occur over the next 12 hours: Shaking chills. You have a temperature by mouth above 102 F (38.9 C), not controlled by medicine. Chest, back, or muscle pain. People around you feel you are not acting correctly or are confused. Shortness of breath or difficulty breathing. Dizziness and fainting. You get a rash or develop hives. You have a decrease in urine output. Your urine turns a dark color or changes to pink, red, or brown. Any of the following symptoms occur over the next 10 days: You have a temperature by mouth above 102 F (38.9 C), not controlled by medicine. Shortness of breath. Weakness after normal activity. The white part of the eye turns yellow (jaundice). You have a decrease in the amount of urine or are urinating less often. Your urine turns a dark color or changes to pink, red, or brown. Document Released: 09/08/2000 Document Revised: 12/04/2011 Document Reviewed: 04/27/2008 [facility] Patient Information 2014 [place], [place].  _______________________________________________________________________

## 2023-04-23 NOTE — Telephone Encounter (Signed)
Pt last seen on 04/17/22 No follow up scheduled

## 2023-04-24 ENCOUNTER — Other Ambulatory Visit (HOSPITAL_BASED_OUTPATIENT_CLINIC_OR_DEPARTMENT_OTHER): Payer: Self-pay

## 2023-04-24 MED ORDER — FENOFIBRATE 160 MG PO TABS
160.0000 mg | ORAL_TABLET | Freq: Every day | ORAL | 1 refills | Status: AC
  Filled 2023-04-24: qty 90, 90d supply, fill #0

## 2023-04-24 MED ORDER — ESOMEPRAZOLE MAGNESIUM 40 MG PO CPDR
40.0000 mg | DELAYED_RELEASE_CAPSULE | Freq: Every day | ORAL | 1 refills | Status: AC
  Filled 2023-04-24: qty 90, 90d supply, fill #0

## 2023-04-25 ENCOUNTER — Ambulatory Visit (HOSPITAL_COMMUNITY)
Admission: RE | Admit: 2023-04-25 | Discharge: 2023-04-25 | Disposition: A | Discharge status: Home / Self Care | Payer: 59 | Source: Ambulatory Visit | Attending: Physician Assistant | Admitting: Physician Assistant

## 2023-04-25 DIAGNOSIS — R936 Abnormal findings on diagnostic imaging of limbs: Secondary | ICD-10-CM | POA: Diagnosis not present

## 2023-04-25 DIAGNOSIS — M25532 Pain in left wrist: Secondary | ICD-10-CM | POA: Diagnosis not present

## 2023-04-26 ENCOUNTER — Encounter: Payer: 59 | Admitting: Orthopaedic Surgery

## 2023-04-27 ENCOUNTER — Observation Stay (HOSPITAL_COMMUNITY)
Admission: RE | Admit: 2023-04-27 | Discharge: 2023-04-28 | Disposition: A | Discharge status: Home / Self Care | Payer: 59 | Attending: Orthopaedic Surgery | Admitting: Orthopaedic Surgery

## 2023-04-27 ENCOUNTER — Other Ambulatory Visit: Payer: Self-pay

## 2023-04-27 ENCOUNTER — Encounter (HOSPITAL_COMMUNITY)
Admission: RE | Disposition: A | Discharge status: Home / Self Care | Payer: Self-pay | Source: Home / Self Care | Attending: Orthopaedic Surgery

## 2023-04-27 ENCOUNTER — Observation Stay (HOSPITAL_COMMUNITY): Payer: 59

## 2023-04-27 ENCOUNTER — Other Ambulatory Visit (HOSPITAL_BASED_OUTPATIENT_CLINIC_OR_DEPARTMENT_OTHER): Payer: Self-pay

## 2023-04-27 ENCOUNTER — Ambulatory Visit (HOSPITAL_COMMUNITY): Payer: 59 | Admitting: Anesthesiology

## 2023-04-27 ENCOUNTER — Ambulatory Visit (HOSPITAL_BASED_OUTPATIENT_CLINIC_OR_DEPARTMENT_OTHER): Payer: 59 | Admitting: Anesthesiology

## 2023-04-27 ENCOUNTER — Ambulatory Visit (HOSPITAL_COMMUNITY): Payer: 59

## 2023-04-27 ENCOUNTER — Encounter (HOSPITAL_COMMUNITY): Payer: Self-pay | Admitting: Orthopaedic Surgery

## 2023-04-27 DIAGNOSIS — M87852 Other osteonecrosis, left femur: Secondary | ICD-10-CM

## 2023-04-27 DIAGNOSIS — M87052 Idiopathic aseptic necrosis of left femur: Secondary | ICD-10-CM | POA: Diagnosis not present

## 2023-04-27 DIAGNOSIS — Z01818 Encounter for other preprocedural examination: Secondary | ICD-10-CM

## 2023-04-27 DIAGNOSIS — Z96642 Presence of left artificial hip joint: Secondary | ICD-10-CM | POA: Diagnosis not present

## 2023-04-27 DIAGNOSIS — Z91040 Latex allergy status: Secondary | ICD-10-CM | POA: Insufficient documentation

## 2023-04-27 DIAGNOSIS — Z471 Aftercare following joint replacement surgery: Secondary | ICD-10-CM | POA: Diagnosis not present

## 2023-04-27 DIAGNOSIS — Z96641 Presence of right artificial hip joint: Secondary | ICD-10-CM | POA: Diagnosis not present

## 2023-04-27 DIAGNOSIS — Z96643 Presence of artificial hip joint, bilateral: Secondary | ICD-10-CM | POA: Diagnosis not present

## 2023-04-27 HISTORY — PX: TOTAL HIP ARTHROPLASTY: SHX124

## 2023-04-27 LAB — TYPE AND SCREEN
ABO/RH(D): A POS
Antibody Screen: NEGATIVE

## 2023-04-27 LAB — TSH: TSH: 1.869 u[IU]/mL (ref 0.350–4.500)

## 2023-04-27 SURGERY — ARTHROPLASTY, HIP, TOTAL, ANTERIOR APPROACH
Anesthesia: Spinal | Site: Hip | Laterality: Left

## 2023-04-27 MED ORDER — OXYCODONE HCL 5 MG PO TABS
10.0000 mg | ORAL_TABLET | ORAL | Status: DC | PRN
  Administered 2023-04-28 (×3): 15 mg via ORAL
  Filled 2023-04-27 (×2): qty 3
  Filled 2023-04-27: qty 2
  Filled 2023-04-27: qty 3

## 2023-04-27 MED ORDER — AMPHETAMINE-DEXTROAMPHETAMINE 10 MG PO TABS
20.0000 mg | ORAL_TABLET | Freq: Three times a day (TID) | ORAL | Status: DC
  Administered 2023-04-28 (×2): 10 mg via ORAL
  Filled 2023-04-27 (×2): qty 2

## 2023-04-27 MED ORDER — LIDOCAINE HCL (PF) 2 % IJ SOLN
INTRAMUSCULAR | Status: AC
  Filled 2023-04-27: qty 5

## 2023-04-27 MED ORDER — PROPOFOL 1000 MG/100ML IV EMUL
INTRAVENOUS | Status: AC
  Filled 2023-04-27: qty 100

## 2023-04-27 MED ORDER — FENOFIBRATE 160 MG PO TABS
160.0000 mg | ORAL_TABLET | Freq: Every day | ORAL | Status: DC
  Administered 2023-04-28: 160 mg via ORAL
  Filled 2023-04-27: qty 1

## 2023-04-27 MED ORDER — ONDANSETRON HCL 4 MG PO TABS: 4.0000 mg | ORAL_TABLET | Freq: Four times a day (QID) | ORAL | Status: DC | PRN

## 2023-04-27 MED ORDER — HYDROMORPHONE HCL 1 MG/ML IJ SOLN
0.5000 mg | INTRAMUSCULAR | Status: DC | PRN
  Administered 2023-04-27: 1 mg via INTRAVENOUS
  Filled 2023-04-27 (×2): qty 1

## 2023-04-27 MED ORDER — CHLORHEXIDINE GLUCONATE 4 % EX SOLN
1.0000 | CUTANEOUS | 1 refills | Status: DC
  Filled 2023-04-27 – 2023-04-28 (×2): qty 946, 30d supply, fill #0

## 2023-04-27 MED ORDER — ONDANSETRON HCL 4 MG/2ML IJ SOLN
INTRAMUSCULAR | Status: DC | PRN
  Administered 2023-04-27: 4 mg via INTRAVENOUS

## 2023-04-27 MED ORDER — ACETAMINOPHEN 500 MG PO TABS
1000.0000 mg | ORAL_TABLET | Freq: Once | ORAL | Status: AC
  Administered 2023-04-27: 1000 mg via ORAL
  Filled 2023-04-27: qty 2

## 2023-04-27 MED ORDER — FENTANYL CITRATE (PF) 100 MCG/2ML IJ SOLN
INTRAMUSCULAR | Status: DC | PRN
  Administered 2023-04-27: 25 ug via INTRAVENOUS
  Administered 2023-04-27: 50 ug via INTRAVENOUS
  Administered 2023-04-27: 25 ug via INTRAVENOUS

## 2023-04-27 MED ORDER — PROPOFOL 10 MG/ML IV BOLUS
INTRAVENOUS | Status: DC | PRN
Start: 2023-04-27 — End: 2023-04-27
  Administered 2023-04-27 (×2): 20 mg via INTRAVENOUS
  Administered 2023-04-27: 100 mg via INTRAVENOUS
  Administered 2023-04-27: 20 mg via INTRAVENOUS

## 2023-04-27 MED ORDER — VANCOMYCIN HCL 1500 MG/300ML IV SOLN
1500.0000 mg | INTRAVENOUS | Status: AC
  Administered 2023-04-27: 1500 mg via INTRAVENOUS
  Filled 2023-04-27: qty 300

## 2023-04-27 MED ORDER — BUPIVACAINE IN DEXTROSE 0.75-8.25 % IT SOLN
INTRATHECAL | Status: DC | PRN
  Administered 2023-04-27: 1.8 mL via INTRATHECAL

## 2023-04-27 MED ORDER — PHENOL 1.4 % MT LIQD: 1.0000 | OROMUCOSAL | Status: DC | PRN

## 2023-04-27 MED ORDER — FENTANYL CITRATE (PF) 100 MCG/2ML IJ SOLN
INTRAMUSCULAR | Status: AC
  Filled 2023-04-27: qty 2

## 2023-04-27 MED ORDER — ZINC GLUCONATE 100 MG PO TABS: 100.0000 mg | ORAL_TABLET | Freq: Every day | ORAL | Status: DC

## 2023-04-27 MED ORDER — DOCUSATE SODIUM 100 MG PO CAPS
100.0000 mg | ORAL_CAPSULE | Freq: Two times a day (BID) | ORAL | Status: DC
  Administered 2023-04-27 – 2023-04-28 (×2): 100 mg via ORAL
  Filled 2023-04-27 (×2): qty 1

## 2023-04-27 MED ORDER — BIOTIN 1 MG PO CAPS: 1.0000 mg | ORAL_CAPSULE | Freq: Every day | ORAL | Status: DC

## 2023-04-27 MED ORDER — CHLORHEXIDINE GLUCONATE 0.12 % MT SOLN
15.0000 mL | Freq: Once | OROMUCOSAL | Status: AC
  Administered 2023-04-27: 15 mL via OROMUCOSAL

## 2023-04-27 MED ORDER — AMPHETAMINE-DEXTROAMPHETAMINE 20 MG PO TABS: 20.0000 mg | ORAL_TABLET | Freq: Three times a day (TID) | ORAL | Status: DC

## 2023-04-27 MED ORDER — CEFAZOLIN SODIUM-DEXTROSE 2-4 GM/100ML-% IV SOLN
2.0000 g | INTRAVENOUS | Status: AC
  Administered 2023-04-27: 2 g via INTRAVENOUS
  Filled 2023-04-27: qty 100

## 2023-04-27 MED ORDER — ASPIRIN 81 MG PO CHEW
81.0000 mg | CHEWABLE_TABLET | Freq: Two times a day (BID) | ORAL | Status: DC
  Administered 2023-04-27 – 2023-04-28 (×2): 81 mg via ORAL
  Filled 2023-04-27 (×2): qty 1

## 2023-04-27 MED ORDER — MIDAZOLAM HCL 2 MG/2ML IJ SOLN
INTRAMUSCULAR | Status: AC
  Filled 2023-04-27: qty 2

## 2023-04-27 MED ORDER — ALPRAZOLAM 0.5 MG PO TABS: 0.5000 mg | ORAL_TABLET | Freq: Two times a day (BID) | ORAL | Status: DC | PRN

## 2023-04-27 MED ORDER — ONDANSETRON HCL 4 MG/2ML IJ SOLN: 4.0000 mg | Freq: Four times a day (QID) | INTRAMUSCULAR | Status: DC | PRN

## 2023-04-27 MED ORDER — ZINC SULFATE 220 (50 ZN) MG PO CAPS
220.0000 mg | ORAL_CAPSULE | Freq: Every day | ORAL | Status: DC
  Administered 2023-04-28: 220 mg via ORAL
  Filled 2023-04-27: qty 1

## 2023-04-27 MED ORDER — DEXAMETHASONE SODIUM PHOSPHATE 10 MG/ML IJ SOLN
INTRAMUSCULAR | Status: AC
  Filled 2023-04-27: qty 1

## 2023-04-27 MED ORDER — SODIUM CHLORIDE 0.9 % IV SOLN: INTRAVENOUS | Status: DC

## 2023-04-27 MED ORDER — MEPERIDINE HCL 50 MG/ML IJ SOLN: 6.2500 mg | INTRAMUSCULAR | Status: DC | PRN

## 2023-04-27 MED ORDER — HYDROMORPHONE HCL 1 MG/ML IJ SOLN: 0.2500 mg | INTRAMUSCULAR | Status: DC | PRN

## 2023-04-27 MED ORDER — AMPHETAMINE-DEXTROAMPHETAMINE 20 MG PO TABS
20.0000 mg | ORAL_TABLET | Freq: Three times a day (TID) | ORAL | 0 refills | Status: DC
  Filled 2023-04-27: qty 90, 30d supply, fill #0

## 2023-04-27 MED ORDER — CYCLOBENZAPRINE HCL 5 MG PO TABS
5.0000 mg | ORAL_TABLET | Freq: Three times a day (TID) | ORAL | Status: DC | PRN
  Administered 2023-04-27: 5 mg via ORAL
  Filled 2023-04-27: qty 1

## 2023-04-27 MED ORDER — SODIUM CHLORIDE 0.9 % IR SOLN
Status: DC | PRN
  Administered 2023-04-27: 1000 mL

## 2023-04-27 MED ORDER — VITAMIN E 45 MG (100 UNIT) PO CAPS
1000.0000 [IU] | ORAL_CAPSULE | Freq: Every day | ORAL | Status: DC
  Administered 2023-04-28: 1000 [IU] via ORAL
  Filled 2023-04-27: qty 10

## 2023-04-27 MED ORDER — POLYETHYLENE GLYCOL 3350 17 G PO PACK: 17.0000 g | PACK | Freq: Every day | ORAL | Status: DC | PRN

## 2023-04-27 MED ORDER — MIDAZOLAM HCL 2 MG/2ML IJ SOLN: 0.5000 mg | Freq: Once | INTRAMUSCULAR | Status: DC | PRN

## 2023-04-27 MED ORDER — OXYCODONE-ACETAMINOPHEN 7.5-325 MG PO TABS
1.0000 | ORAL_TABLET | Freq: Two times a day (BID) | ORAL | 0 refills | Status: DC | PRN
  Filled 2023-04-27: qty 60, 30d supply, fill #0

## 2023-04-27 MED ORDER — ALUM & MAG HYDROXIDE-SIMETH 200-200-20 MG/5ML PO SUSP: 30.0000 mL | ORAL | Status: DC | PRN

## 2023-04-27 MED ORDER — LACTATED RINGERS IV SOLN: INTRAVENOUS | Status: DC

## 2023-04-27 MED ORDER — PROPOFOL 500 MG/50ML IV EMUL
INTRAVENOUS | Status: DC | PRN
  Administered 2023-04-27: 100 ug/kg/min via INTRAVENOUS

## 2023-04-27 MED ORDER — PROMETHAZINE HCL 25 MG/ML IJ SOLN: 6.2500 mg | INTRAMUSCULAR | Status: DC | PRN

## 2023-04-27 MED ORDER — ONDANSETRON HCL 4 MG/2ML IJ SOLN
INTRAMUSCULAR | Status: AC
  Filled 2023-04-27: qty 2

## 2023-04-27 MED ORDER — PANTOPRAZOLE SODIUM 40 MG PO TBEC
40.0000 mg | DELAYED_RELEASE_TABLET | Freq: Every day | ORAL | Status: DC
  Administered 2023-04-28: 40 mg via ORAL
  Filled 2023-04-27: qty 1

## 2023-04-27 MED ORDER — POVIDONE-IODINE 10 % EX SWAB
2.0000 | Freq: Once | CUTANEOUS | Status: AC
  Administered 2023-04-27: 2 via TOPICAL

## 2023-04-27 MED ORDER — TRANEXAMIC ACID-NACL 1000-0.7 MG/100ML-% IV SOLN
1000.0000 mg | INTRAVENOUS | Status: AC
  Administered 2023-04-27: 1000 mg via INTRAVENOUS
  Filled 2023-04-27: qty 100

## 2023-04-27 MED ORDER — VANCOMYCIN HCL IN DEXTROSE 1-5 GM/200ML-% IV SOLN
1000.0000 mg | Freq: Two times a day (BID) | INTRAVENOUS | Status: AC
  Administered 2023-04-28: 1000 mg via INTRAVENOUS
  Filled 2023-04-27: qty 200

## 2023-04-27 MED ORDER — 0.9 % SODIUM CHLORIDE (POUR BTL) OPTIME
TOPICAL | Status: DC | PRN
  Administered 2023-04-27: 1000 mL

## 2023-04-27 MED ORDER — ORAL CARE MOUTH RINSE: 15.0000 mL | Freq: Once | OROMUCOSAL | Status: AC

## 2023-04-27 MED ORDER — METOCLOPRAMIDE HCL 5 MG/ML IJ SOLN: 5.0000 mg | Freq: Three times a day (TID) | INTRAMUSCULAR | Status: DC | PRN

## 2023-04-27 MED ORDER — DEXAMETHASONE SODIUM PHOSPHATE 10 MG/ML IJ SOLN
INTRAMUSCULAR | Status: DC | PRN
  Administered 2023-04-27: 10 mg via INTRAVENOUS

## 2023-04-27 MED ORDER — STERILE WATER FOR IRRIGATION IR SOLN
Status: DC | PRN
Start: 2023-04-27 — End: 2023-04-27
  Administered 2023-04-27: 2000 mL

## 2023-04-27 MED ORDER — OXYCODONE HCL 5 MG PO TABS: 5.0000 mg | ORAL_TABLET | ORAL | Status: DC | PRN

## 2023-04-27 MED ORDER — MUPIROCIN 2 % EX OINT
1.0000 | TOPICAL_OINTMENT | Freq: Two times a day (BID) | CUTANEOUS | 0 refills | Status: AC
  Filled 2023-04-27: qty 66, 42d supply, fill #0

## 2023-04-27 MED ORDER — OXYCODONE HCL 5 MG PO TABS
5.0000 mg | ORAL_TABLET | Freq: Once | ORAL | Status: AC | PRN
  Administered 2023-04-27: 5 mg via ORAL

## 2023-04-27 MED ORDER — MENTHOL 3 MG MT LOZG: 1.0000 | LOZENGE | OROMUCOSAL | Status: DC | PRN

## 2023-04-27 MED ORDER — OXYCODONE HCL 5 MG/5ML PO SOLN: 5.0000 mg | Freq: Once | ORAL | Status: AC | PRN

## 2023-04-27 MED ORDER — ACETAMINOPHEN 325 MG PO TABS: 325.0000 mg | ORAL_TABLET | Freq: Four times a day (QID) | ORAL | Status: DC | PRN

## 2023-04-27 MED ORDER — METOCLOPRAMIDE HCL 5 MG PO TABS: 5.0000 mg | ORAL_TABLET | Freq: Three times a day (TID) | ORAL | Status: DC | PRN

## 2023-04-27 MED ORDER — MIDAZOLAM HCL 2 MG/2ML IJ SOLN
INTRAMUSCULAR | Status: DC | PRN
  Administered 2023-04-27: 2 mg via INTRAVENOUS

## 2023-04-27 SURGICAL SUPPLY — 43 items
APL SKNCLS STERI-STRIP NONHPOA (GAUZE/BANDAGES/DRESSINGS)
BAG COUNTER SPONGE SURGICOUNT (BAG) ×2 IMPLANT
BAG SPEC THK2 15X12 ZIP CLS (MISCELLANEOUS)
BAG SPNG CNTER NS LX DISP (BAG) ×1
BAG ZIPLOCK 12X15 (MISCELLANEOUS) IMPLANT
BENZOIN TINCTURE PRP APPL 2/3 (GAUZE/BANDAGES/DRESSINGS) IMPLANT
BLADE SAW SGTL 18X1.27X75 (BLADE) ×2 IMPLANT
COVER PERINEAL POST (MISCELLANEOUS) ×2 IMPLANT
COVER SURGICAL LIGHT HANDLE (MISCELLANEOUS) ×2 IMPLANT
CUP SECTOR GRIPTON 58MM (Orthopedic Implant) IMPLANT
DRAPE FOOT SWITCH (DRAPES) ×2 IMPLANT
DRAPE STERI IOBAN 125X83 (DRAPES) ×2 IMPLANT
DRAPE U-SHAPE 47X51 STRL (DRAPES) ×4 IMPLANT
DRESSING AQUACEL AG SP 3.5X10 (GAUZE/BANDAGES/DRESSINGS) IMPLANT
DRSG AQUACEL AG ADV 3.5X10 (GAUZE/BANDAGES/DRESSINGS) ×2 IMPLANT
DRSG AQUACEL AG SP 3.5X10 (GAUZE/BANDAGES/DRESSINGS) ×1
DURAPREP 26ML APPLICATOR (WOUND CARE) ×2 IMPLANT
ELECT REM PT RETURN 15FT ADLT (MISCELLANEOUS) ×2 IMPLANT
GAUZE XEROFORM 1X8 LF (GAUZE/BANDAGES/DRESSINGS) IMPLANT
GLOVE BIO SURGEON STRL SZ7.5 (GLOVE) ×2 IMPLANT
GLOVE BIOGEL PI IND STRL 8 (GLOVE) ×4 IMPLANT
GLOVE ECLIPSE 8.0 STRL XLNG CF (GLOVE) ×2 IMPLANT
GOWN STRL REUS W/ TWL XL LVL3 (GOWN DISPOSABLE) ×4 IMPLANT
GOWN STRL REUS W/TWL XL LVL3 (GOWN DISPOSABLE) ×2
HANDPIECE INTERPULSE COAX TIP (DISPOSABLE) ×1
HEAD CERAMIC 36 PLUS5 (Hips) IMPLANT
HOLDER FOLEY CATH W/STRAP (MISCELLANEOUS) ×2 IMPLANT
KIT TURNOVER KIT A (KITS) IMPLANT
LINER NEUTRAL 36X58 PLUS4 IMPLANT
PACK ANTERIOR HIP CUSTOM (KITS) ×2 IMPLANT
SET HNDPC FAN SPRY TIP SCT (DISPOSABLE) ×2 IMPLANT
STAPLER VISISTAT 35W (STAPLE) IMPLANT
STEM FEMORAL SZ5 HIGH ACTIS (Stem) IMPLANT
STRIP CLOSURE SKIN 1/2X4 (GAUZE/BANDAGES/DRESSINGS) IMPLANT
SUT ETHIBOND NAB CT1 #1 30IN (SUTURE) ×2 IMPLANT
SUT ETHILON 2 0 PS N (SUTURE) IMPLANT
SUT MNCRL AB 4-0 PS2 18 (SUTURE) IMPLANT
SUT VIC AB 0 CT1 36 (SUTURE) ×2 IMPLANT
SUT VIC AB 1 CT1 36 (SUTURE) ×2 IMPLANT
SUT VIC AB 2-0 CT1 27 (SUTURE) ×2
SUT VIC AB 2-0 CT1 TAPERPNT 27 (SUTURE) ×4 IMPLANT
TRAY FOLEY MTR SLVR 16FR STAT (SET/KITS/TRAYS/PACK) IMPLANT
YANKAUER SUCT BULB TIP NO VENT (SUCTIONS) ×2 IMPLANT

## 2023-04-27 NOTE — Interval H&P Note (Signed)
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  There has been no acute or interval changes to medical status.  The risks and benefits of surgery have been discussed in detail and informed consent has been obtained.  The left operative hip has been marked.  04/27/2023 2:39 PM  Connor Martin  has presented today for surgery, with the diagnosis of Avascular necrosis left hip.  The various methods of treatment have been discussed with the patient and family. After consideration of risks, benefits and other options for treatment, the patient has consented to  Procedure(s): LEFT TOTAL HIP ARTHROPLASTY ANTERIOR APPROACH (Left) as a surgical intervention.  The patient's history has been reviewed, patient examined, no change in status, stable for surgery.  I have reviewed the patient's chart and labs.  Questions were answered to the patient's satisfaction.     Kathryne Hitch

## 2023-04-27 NOTE — Op Note (Signed)
Operative Note  Date of operation: 04/27/2023 Preoperative diagnosis: Left hip avascular necrosis Postoperative diagnosis: Same  Procedure: Left direct anterior total hip arthroplasty  Implants: Implant Name Type Inv. Item Serial No. Manufacturer Lot No. LRB No. Used Action  CUP SECTOR GRIPTON - [id number] Orthopedic Implant CUP SECTOR GRIPTON  DEPUY [employer]TH[employer] [id number] Left 1 Implanted  LINER NEUTRAL 36X58 PLUS4 - [id number]  LINER NEUTRAL 36X58 PLUS4  DEPUY ORTHOPAEDICS [id number] Left 1 Implanted  STEM FEMORAL SZ5 HIGH ACTIS - [id number] Stem STEM FEMORAL SZ5 HIGH ACTIS  DEPUY ORTHOPAEDICS [id number] Left 1 Implanted  HEAD CERAMIC 36 PLUS5 - [id number] Hips HEAD CERAMIC 36 PLUS5  DEPUY ORTHOPAEDICS [id number] Left 1 Implanted   Surgeon: Vanita Panda. 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  That is done well.  At this point he wished to proceed with a left total hip arthroplasty given his continued left hip pain and AVN has been well diagnosed with MRI of his left hip.  Having had this before he is fully aware of the risk of acute blood loss anemia, nerve or vessel injury, fracture, infection, DVT, implant failure, dislocation, leg length differences and skin and soft tissue issues.  He understands her goals are hopefully decrease pain, improve mobility, and improve quality of life.  Procedure description: After informed consent was obtained and the appropriate left hip was marked, the patient was brought to the operating room and set up on the stretcher where spinal anesthesia was obtained.  He was then laid in the supine position on the stretcher and a Foley catheter was placed.  Traction boots were placed on both his  feet and he was next placed supine on the Hana fracture table with a perineal post in place in both legs and inline skeletal traction devices but no traction applied.  We then assess his left hip and pelvis radiographically with C arm guidance.  The left hip was then prepped and draped with DuraPrep and sterile drapes.  Timeout was called and he was benefits correct patient correct left hip.  An incision was then made just inferior and posterior to the ASIS and carried slightly obliquely down the leg.  Dissection was carried down to the tensor fascia lata muscle and the tensor fascia was divided longitudinally to proceed with a direct anterior approach to the hip.  Circumflex vessels were identified and cauterized.  The hip capsule was identified and opened up in L-type format finding a moderate joint effusion consistent with AVN.  Cobra retractors were placed around the medial and lateral femoral neck and a femoral neck cut was made with oscillating saw just proximal to the lesser trochanter.  This was completed with an osteotome and a corkscrew guide was placed in the femoral head and the femoral head was removed in its entirety.  There was definitely evidence of AVN.  A bent Hohmann was then placed over the acetabular rim medially and remnants of the acetabular labrum and other debris removed.  Reaming was then initiated from a size 43 reamer and stepwise increments going up to a size 57 reamer with all reamers placed under direct visualization and the last reamer was also placed on direct fluoroscopy in order to obtain the depth of reaming, the  inclination and the anteversion.  The real DePuy sector GRIPTION acetabular component size 58 was then placed under direct visualization and fluoroscopy without difficulty.  A 36+4 polyethylene liner was then placed within that acetabular component.  Attention was then turned to the femur.  With the right leg externally rotated to 120 degrees, extended and adducted, a  Mueller retractor was placed medially and a Hohmann retractor was placed behind the greater trochanter.  The lateral joint capsule was released and a box cutting osteotome was used in the femoral canal.  Broaching was then started from a size 0 broach going up to a size 5 broach.  With a size 5 broach in place we trialed a high offset femoral neck based off his anatomy and a 36+1.5 trial hip ball.  The leg was brought over and up and with traction and internal rotation reduced in the pelvis.  Based on radiographic and clinical assessment we needed just some more leg length.  The hip was dislocated and the trial components were removed.  We then placed the real Actis femoral component size 5 with high offset and a 36+5 ceramic head ball.  This reduced and acetabulum and we are pleased with leg length, offset, range of motion and stability.  The soft tissue was then irrigated with normal saline solution.  The joint capsule was closed with interrupted #1 Ethibond suture followed by #1 Vicryl close the tensor fascia.  0 Vicryl was used to close deep tissue and 2-0 Vicryl was used to close subcutaneous tissue.  The skin was closed with staples.  An Aquacel dressing was applied.  The patient was taken off of the Hana table and taken to the recovery room in stable condition.  All final counts were correct and there were no complications noted.  Rexene Edison, PA-C did assist during the entire case and beginning to end and his assistance was crucial and medically necessary for soft tissue management and retraction, helping guide implant placement and a layered closure of the wound.

## 2023-04-27 NOTE — Transfer of Care (Signed)
Immediate Anesthesia Transfer of Care Note  Patient: Connor Martin  Procedure(s) Performed: LEFT TOTAL HIP ARTHROPLASTY ANTERIOR APPROACH (Left: Hip)  Patient Location: PACU  Anesthesia Type:Spinal and MAC combined with regional for post-op pain  Level of Consciousness: awake, alert , oriented, and patient cooperative  Airway & Oxygen Therapy: Patient Spontanous Breathing and Patient connected to face mask oxygen  Post-op Assessment: Report given to RN and Post -op Vital signs reviewed and stable  Post vital signs: Reviewed and stable  Last Vitals:  Vitals Value Taken Time  BP 110/70 04/27/23 1827  Temp    Pulse 79 04/27/23 1829  Resp 15 04/27/23 1829  SpO2 98 % 04/27/23 1829  Vitals shown include unfiled device data.  Last Pain:  Vitals:   04/27/23 1522  TempSrc: Oral  PainSc:          Complications: No notable events documented.

## 2023-04-27 NOTE — Anesthesia Preprocedure Evaluation (Addendum)
Anesthesia Evaluation  Patient identified by MRN, date of birth, ID band Patient awake    Reviewed: Allergy & Precautions, NPO status , Patient's Chart, lab work & pertinent test results  History of Anesthesia Complications Negative for: history of anesthetic complications  Airway Mallampati: III  TM Distance: >3 FB Neck ROM: Full    Dental  (+) Dental Advisory Given   Pulmonary neg shortness of breath, sleep apnea , neg COPD, neg recent URI   Pulmonary exam normal breath sounds clear to auscultation       Cardiovascular (-) hypertension(-) angina (-) Past MI, (-) Cardiac Stents and (-) CABG (-) dysrhythmias  Rhythm:Regular Rate:Normal  HLD   Neuro/Psych  Headaches PSYCHIATRIC DISORDERS (ADHD, panic attacks) Anxiety Depression     Neuromuscular disease (low back pain L4-5)    GI/Hepatic ,GERD  Medicated,,(+)       alcohol useH/o pancreatitis in 2017, fatty liver H/o pancreatitis in 2017   Endo/Other    Renal/GU      Musculoskeletal  (+) Arthritis ,    Abdominal   Peds  Hematology  (+) Blood dyscrasia, anemia Lab Results      Component                Value               Date                      WBC                      5.5                 04/09/2023                HGB                      14.4                04/09/2023                HCT                      43.3                04/09/2023                MCV                      93.3                04/09/2023                PLT                      132 (L)             04/09/2023              Anesthesia Other Findings Patient says he drinks ~2 drinks/week depending on the week  Reproductive/Obstetrics                              Anesthesia Physical Anesthesia Plan  ASA: 2  Anesthesia Plan: Spinal   Post-op Pain Management: Tylenol PO (pre-op)*   Induction:   PONV Risk Score and Plan: 1 and Ondansetron and Treatment may vary  due to age or medical condition  Airway Management Planned: Natural Airway and Simple Face Mask  Additional Equipment: None  Intra-op Plan:   Post-operative Plan:   Informed Consent: I have reviewed the patients History and Physical, chart, labs and discussed the procedure including the risks, benefits and alternatives for the proposed anesthesia with the patient or authorized representative who has indicated his/her understanding and acceptance.       Plan Discussed with: Anesthesiologist and CRNA  Anesthesia Plan Comments: (I have discussed risks of neuraxial anesthesia including but not limited to infection, bleeding, nerve injury, back pain, headache, seizures, and failure of block. Patient denies bleeding disorders and is not currently anticoagulated. Labs have been reviewed. Risks and benefits discussed. All patient's questions answered.   Discussed with patient risks of MAC including, but not limited to, minor pain or discomfort, hearing people in the room, and possible need for backup general anesthesia. Risks for general anesthesia also discussed including, but not limited to, sore throat, hoarse voice, chipped/damaged teeth, injury to vocal cords, nausea and vomiting, allergic reactions, lung infection, heart attack, stroke, and death. All questions answered. )         Anesthesia Quick Evaluation

## 2023-04-27 NOTE — Anesthesia Procedure Notes (Signed)
Spinal  Patient location during procedure: OR Start time: 04/27/2023 5:00 PM End time: 04/27/2023 5:03 PM Reason for block: surgical anesthesia Staffing Performed: anesthesiologist  Anesthesiologist: Linton Rump, MD Performed by: Linton Rump, MD Authorized by: Linton Rump, MD   Preanesthetic Checklist Completed: patient identified, IV checked, site marked, risks and benefits discussed, surgical consent, monitors and equipment checked, pre-op evaluation and timeout performed Spinal Block Patient position: sitting Prep: DuraPrep Patient monitoring: blood pressure and continuous pulse ox Approach: midline Location: L3-4 Injection technique: single-shot Needle Needle type: Pencan  Needle gauge: 24 G Needle length: 9 cm Additional Notes Risks and benefits of neuraxial anesthesia including, but not limited to, infection, bleeding, local anesthetic toxicity, headache, hypotension, back pain, block failure, etc. were discussed with the patient. The patient expressed understanding and consented to the procedure. I confirmed that the patient has no bleeding disorders and is not taking blood thinners. I confirmed the patient's last platelet count with the nurse. Monitors were applied. A time-out was performed immediately prior to the procedure. Sterile technique was used throughout the whole procedure.   1 attempt(s)

## 2023-04-27 NOTE — Anesthesia Postprocedure Evaluation (Signed)
Anesthesia Post Note  Patient: CAMDYN BESKE  Procedure(s) Performed: LEFT TOTAL HIP ARTHROPLASTY ANTERIOR APPROACH (Left: Hip)     Patient location during evaluation: PACU Anesthesia Type: Spinal and MAC Level of consciousness: awake Pain management: pain level controlled Vital Signs Assessment: post-procedure vital signs reviewed and stable Respiratory status: spontaneous breathing, respiratory function stable and nonlabored ventilation Cardiovascular status: blood pressure returned to baseline and stable Postop Assessment: no headache, no backache and no apparent nausea or vomiting Anesthetic complications: no   No notable events documented.  Last Vitals:  Vitals:   04/27/23 1900 04/27/23 1915  BP: 118/75 113/79  Pulse: (!) 57 74  Resp: 16 19  Temp:    SpO2: 99% 98%    Last Pain:  Vitals:   04/27/23 1900  TempSrc:   PainSc: 0-No pain                 Linton Rump

## 2023-04-28 ENCOUNTER — Other Ambulatory Visit (HOSPITAL_BASED_OUTPATIENT_CLINIC_OR_DEPARTMENT_OTHER): Payer: Self-pay

## 2023-04-28 ENCOUNTER — Other Ambulatory Visit: Payer: Self-pay

## 2023-04-28 ENCOUNTER — Encounter: Payer: Self-pay | Admitting: Orthopaedic Surgery

## 2023-04-28 DIAGNOSIS — M87852 Other osteonecrosis, left femur: Secondary | ICD-10-CM | POA: Diagnosis not present

## 2023-04-28 DIAGNOSIS — M87052 Idiopathic aseptic necrosis of left femur: Secondary | ICD-10-CM | POA: Diagnosis not present

## 2023-04-28 DIAGNOSIS — Z96641 Presence of right artificial hip joint: Secondary | ICD-10-CM | POA: Diagnosis not present

## 2023-04-28 DIAGNOSIS — Z91040 Latex allergy status: Secondary | ICD-10-CM | POA: Diagnosis not present

## 2023-04-28 DIAGNOSIS — R531 Weakness: Secondary | ICD-10-CM | POA: Diagnosis not present

## 2023-04-28 MED ORDER — HYDROMORPHONE HCL 4 MG PO TABS
4.0000 mg | ORAL_TABLET | ORAL | 0 refills | Status: DC | PRN
  Filled 2023-04-28: qty 30, 5d supply, fill #0

## 2023-04-28 MED ORDER — ASPIRIN 81 MG PO CHEW
81.0000 mg | CHEWABLE_TABLET | Freq: Two times a day (BID) | ORAL | 0 refills | Status: DC
  Filled 2023-04-28: qty 90, 45d supply, fill #0

## 2023-04-28 MED ORDER — KETOROLAC TROMETHAMINE 15 MG/ML IJ SOLN
7.5000 mg | Freq: Four times a day (QID) | INTRAMUSCULAR | Status: DC
  Administered 2023-04-28: 7.5 mg via INTRAVENOUS
  Filled 2023-04-28: qty 1

## 2023-04-28 MED ORDER — GABAPENTIN 100 MG PO CAPS
100.0000 mg | ORAL_CAPSULE | Freq: Three times a day (TID) | ORAL | 1 refills | Status: DC | PRN
  Filled 2023-04-28: qty 60, 20d supply, fill #0

## 2023-04-28 MED ORDER — TIZANIDINE HCL 4 MG PO TABS
4.0000 mg | ORAL_TABLET | Freq: Four times a day (QID) | ORAL | 0 refills | Status: DC | PRN
  Filled 2023-04-28: qty 30, 8d supply, fill #0

## 2023-04-28 NOTE — Discharge Instructions (Signed)

## 2023-04-28 NOTE — Progress Notes (Signed)
Physical Therapy Treatment Patient Details Name: Connor Martin MRN: [id number] DOB: April 20, 1976 Today's Date: 04/28/2023   History of Present Illness Pt is a 47 year old male s/p L THA, direct anterior approach on 04/27/23.  PMHx significant for but not limited to: R THA 12/29/22, anxiety, chronic LBP, HLD, OSA and recent left wrist pain (has sleeve/brace present at hospital)    PT Comments  Pt ambulated with platform RW and reports improvement in pain (especially in left wrist).  Pt provided with HEP handout and had no further questions.  Pt feels ready for d/c home to his mother's today.    If plan is discharge home, recommend the following:     Can travel by private vehicle        Equipment Recommendations  Other (comment) (left platform attachment for his RW)    Recommendations for Other Services       Precautions / Restrictions Precautions Precautions: Fall Restrictions Weight Bearing Restrictions: Yes LLE Weight Bearing: Weight bearing as tolerated     Mobility  Bed Mobility Overal bed mobility: Needs Assistance Bed Mobility: Supine to Sit     Supine to sit: Supervision, HOB elevated     General bed mobility comments: pt in recliner    Transfers Overall transfer level: Needs assistance Equipment used: Bilateral platform walker Transfers: Sit to/from Stand Sit to Stand: Supervision           General transfer comment: cues for UE and LE positioning for pain control    Ambulation/Gait Ambulation/Gait assistance: Supervision Gait Distance (Feet): 400 Feet Assistive device: Bilateral platform walker Gait Pattern/deviations: Decreased stance time - left, Antalgic, Step-through pattern       General Gait Details: verbal cues for RW positioning, posture, improved reciprocal gait with bil forearm support from platforms (likely on needs left platform to assist with his wrist pain/issues)   Stairs             Wheelchair Mobility     Tilt Bed     Modified Rankin (Stroke Patients Only)       Balance                                            Cognition Arousal/Alertness: Awake/alert Behavior During Therapy: WFL for tasks assessed/performed Overall Cognitive Status: Within Functional Limits for tasks assessed                                          Exercises Total Joint Exercises Ankle Circles/Pumps: AROM, Both, 10 reps Quad Sets: AROM, Both, 10 reps Heel Slides: AAROM, Left, 10 reps, Supine Hip ABduction/ADduction: AAROM, Left, Supine, 10 reps Long Arc Quad: AROM, Left, Seated, 10 reps Knee Flexion: AROM, Standing, Left, 10 reps Standing Hip Extension: AROM, 10 reps, Left, Standing    General Comments        Pertinent Vitals/Pain Pain Assessment Pain Assessment: 0-10 Pain Score: 6  Pain Location: left hip Pain Descriptors / Indicators: Sore, Burning Pain Intervention(s): Monitored during session, Repositioned    Home Living Family/patient expects to be discharged to:: Private residence   Available Help at Discharge: Family Type of Home: House Home Access: Ramped entrance       Home Layout: Able to live on main level with bedroom/bathroom Home Equipment: Rolling  Walker (2 wheels);Cane - single point Additional Comments: plans to stay at his mother's home as above upon d/c    Prior Function            PT Goals (current goals can now be found in the care plan section) Acute Rehab PT Goals PT Goal Formulation: With patient Time For Goal Achievement: 05/05/23 Potential to Achieve Goals: Good Progress towards PT goals: Progressing toward goals    Frequency    7X/week      PT Plan      Co-evaluation              AM-PAC PT "6 Clicks" Mobility   Outcome Measure  Help needed turning from your back to your side while in a flat bed without using bedrails?: A Little Help needed moving from lying on your back to sitting on the side of a flat bed without  using bedrails?: A Little Help needed moving to and from a bed to a chair (including a wheelchair)?: A Little Help needed standing up from a chair using your arms (e.g., wheelchair or bedside chair)?: A Little Help needed to walk in hospital room?: A Little Help needed climbing 3-5 steps with a railing? : A Little 6 Click Score: 18    End of Session Equipment Utilized During Treatment: Gait belt Activity Tolerance: Patient tolerated treatment well Patient left: in chair;with call bell/phone within reach Nurse Communication: Mobility status PT Visit Diagnosis: Difficulty in walking, not elsewhere classified (R26.2)     Time: 5621-3086 PT Time Calculation (min) (ACUTE ONLY): 10 min  Charges:    $Gait Training: 8-22 mins  PT General Charges $$ ACUTE PT VISIT: 1 Visit                    Paulino Door, DPT Physical Therapist Acute Rehabilitation Services Office: [phone]    Janan Halter P[place] 04/28/2023, 12:53 PM

## 2023-04-28 NOTE — Discharge Summary (Signed)
Patient ID: Connor Martin MRN: [id number] DOB/AGE: 46/30/1977 45 y.o.  Admit date: 04/27/2023 Discharge date: 04/28/2023  Admission Diagnoses:  Principal Problem:   Avascular necrosis of bone of left hip [facility]) Active Problems:   Status post left hip replacement   Discharge Diagnoses:  Same  Past Medical History:  Diagnosis Date   Acute pancreatitis hospitalized 04/06/2016   Acute sinusitis    ADD (attention deficit disorder)    Anxiety    Arthritis    Avascular necrosis of bone of hip (HCC)    Blood in stool    Bronchitis    Chronic lower back pain    Depression    Dyslipidemia    Family history of colon cancer    Fatty liver    GERD (gastroesophageal reflux disease)    Headache    "@ least once/week" (04/07/2016)   Hypertriglyceridemia    Hypertriglyceridemia    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 male who was admitted 04/27/2023 for operative treatment ofAvascular necrosis of bone of left hip (HCC). Patient has severe unremitting pain that affects sleep, daily activities, and work/hobbies. After pre-op clearance the patient was taken to the operating room on 04/27/2023 and underwent  Procedure(s): LEFT TOTAL HIP ARTHROPLASTY ANTERIOR APPROACH.    Patient was given perioperative antibiotics:  Anti-infectives (From admission, onward)    Start     Dose/Rate Route Frequency Ordered Stop   04/28/23 0400  vancomycin (VANCOCIN) IVPB 1000 mg/200 mL premix        1,000 mg 200 mL/hr over 60 Minutes Intravenous Every 12 hours 04/27/23 2007 04/28/23 0552   04/27/23 1345  ceFAZolin (ANCEF) IVPB 2g/100 mL premix        2 g 200  mL/hr over 30 Minutes Intravenous On call to O.R. 04/27/23 1335 04/27/23 1656   04/27/23 1335  vancomycin (VANCOREADY) IVPB 1500 mg/300 mL        1,500 mg 150 mL/hr over 120 Minutes Intravenous 60 min pre-op 04/27/23 1335 04/27/23 1746        Patient was given sequential compression devices, early ambulation, and chemoprophylaxis to prevent DVT.  Patient benefited maximally from hospital stay and there were no complications.    Recent vital signs: Patient Vitals for the past 24 hrs:  BP Temp Temp src Pulse Resp SpO2 Height Weight  04/28/23 0919 (!) 145/99 98.2 F (36.8 C) -- 98 18 100 % -- --  04/28/23 0140 (!) 144/90 98.1 F (36.7 C) Oral (!) 103 16 94 % -- --  04/27/23 2223 (!) 145/90 98.5 F (36.9 C) Oral 99 18 98 % -- --  04/27/23 2005 (!) 137/93 98 F (36.7 C) Oral (!) 59 18 100 % -- --  04/27/23 2005 -- -- -- -- -- -- 6' 1.5" (1.867 m) 101.6 kg  04/27/23 1945 125/85 -- -- 66 19 100 % -- --  04/27/23 1934 127/77 -- -- 75 18 100 % -- --  04/27/23 1915 113/79 -- -- 74 19 98 % -- --  04/27/23 1900 118/75 -- -- (!) 57 16 99 % -- --  04/27/23 1845 108/76 -- -- 76 17 99 % -- --  04/27/23 1827 110/70 (!) 97.5 F (36.4 C) -- 86 (!) 9 98 % -- --  04/27/23 1522 127/87 98 F (36.7 C) Oral 72 18 98 % -- --  04/27/23 1420 -- -- -- -- -- -- -- 101.6 kg     Recent laboratory studies:  Recent Labs    04/28/23 0413  WBC 11.9*  HGB 12.6*  HCT 38.0*  PLT 149*  NA 135  K 4.0  CL 104  CO2 20*  BUN 13  CREATININE 0.90  GLUCOSE 149*  CALCIUM 9.0     Discharge Medications:   Allergies as of 04/28/2023       Reactions   Diphenhydramine Hives, Palpitations   Unsure, SOB Other Reaction(s): Other (See Comments) Unknown    Unsure, SOB    Unknown "increased heart rate"    unsure   Latex Other (See Comments)   Does not prefer Does not prefer Does not prefer    Does not prefer   Sumatriptan Swelling   Brain Brain Brain    Brain        Medication List     STOP  taking these medications    oxyCODONE-acetaminophen 7.5-325 MG tablet Commonly known as: PERCOCET       TAKE these medications    ALPRAZolam 0.5 MG tablet Commonly known as: Xanax Take 1 tablet (0.5 mg total) by mouth 2 (two) times daily as needed.   amphetamine-dextroamphetamine 20 MG tablet Commonly known as: Adderall Take 1 tablet (20 mg total) by mouth 3 (three) times daily.   aspirin 81 MG chewable tablet Chew 1 tablet (81 mg total) by mouth 2 (two) times daily.   betamethasone dipropionate 0.05 % ointment Commonly known as: DIPROLENE Apply a thin layer to affected area(s) twice daily.   Biotin 1 MG Caps Take 1 mg by mouth daily at 12 noon.   chlorhexidine 4 % external liquid Commonly known as: HIBICLENS Apply 15 mLs (1 Application total) topically as directed for 30 doses. Use as directed daily for 5 days every other week for 6 weeks.   esomeprazole 40 MG capsule Commonly known as: NexIUM Take 1 capsule (40 mg total) by mouth daily.   fenofibrate 160 MG tablet Take 1 tablet (160 mg total) by mouth daily.   gabapentin 100 MG capsule Commonly known as: Neurontin Take 1 capsule (100 mg total) by mouth 3 (three) times daily as needed.   HYDROmorphone 4 MG tablet Commonly known as: DILAUDID Take 1 tablet (4 mg total) by mouth every 4 (four) hours as needed for severe pain.   imiquimod 5 % cream Commonly known as: ALDARA Apply topically 5 nights per week, use occlusive dressing, wash off after 8-10 hours.   levocetirizine 5 MG tablet Commonly known as: XYZAL Take 1 tablet (5 mg total) by mouth every evening.   magnesium oxide 400 MG tablet Commonly known as: MAG-OX Take 400 mg by mouth daily.   Milk Thistle 200 MG Caps Take 200 mg by mouth daily.   multivitamin with minerals Tabs tablet Take 1 tablet by mouth daily.   mupirocin ointment 2 % Commonly known as: BACTROBAN Place 1 Application into the nose 2 (two) times daily for 60 doses. Use as  directed 2 times daily for 5 days every other week for 6 weeks.   Nurtec 75 MG Tbdp Generic drug: Rimegepant Sulfate Take 1 tablet by mouth as needed. (Take 75 mg by mouth  as needed.)   omega-3 acid ethyl esters 1 g capsule Commonly known as: Lovaza Take 2 capsule by mouth 2 times daily (take 2 capsule by oral route 2 times every day)   tiZANidine 4 MG tablet Commonly known as: ZANAFLEX Take 1 tablet (4 mg total) by mouth every 6 (six) hours as needed for muscle spasms.   vitamin E 1000 UNIT capsule Take 1,000 Units by mouth daily.   Zinc Gluconate 100 MG Tabs Take 100 mg by mouth daily.               Durable Medical Equipment  (From admission, onward)           Start     Ordered   04/27/23 2008  DME 3 n 1  Once        04/27/23 2007   04/27/23 2008  DME Walker rolling  Once       Question Answer Comment  Walker: With 5 Inch Wheels   Patient needs a walker to treat with the following condition Status post total replacement of left hip      04/27/23 2007            Diagnostic Studies: MR Wrist Left w/o contrast  Result Date: 04/27/2023 CLINICAL DATA:  Persistent left wrist pain for 1 year. EXAM: MR OF THE LEFT WRIST WITHOUT CONTRAST TECHNIQUE: Multiplanar, multisequence MR imaging of the left wrist was performed. No intravenous contrast was administered. COMPARISON:  Left wrist radiographs 04/19/2023 FINDINGS: Despite efforts by the technologist and patient, motion artifact is present on today's exam and could not be eliminated. This reduces exam sensitivity and specificity. Ligaments: No definite scapholunate or lunotriquetral ligament tear is seen. Triangular fibrocartilage: The central radial attachment of the triangular fibrocartilage complex is intact. The foveal attachment of the peripheral triangular fibrocartilage complex is intact. There appears to be a mildly complex mildly decreased T1 increased T2 signal ganglion extending in the volar direction from  just distal to the ulnar styloid attachment of the peripheral triangular fibrocartilage complex (coronal images 9 and 10, sagittal images 6 and 7, axial images 6 through 8). The triangular fibrocartilage complex may be intact, and this ganglion may extend through a tiny defect within the peripheral medial volar joint capsule at the distal aspect of the ulnar styloid. The ganglion does appear to be contiguous with joint fluid within the ulnar-proximal carpal row joint on sagittal series 9 images 7 through 9. Tendons: The dorsal extensor tendon compartments are intact. The flexor tendons and flexor retinaculum are intact. Carpal tunnel/median nerve: Normal carpal tunnel. Normal median nerve. Guyon's canal: Normal. Joint/cartilage: There is partial-thickness cartilage loss seen at the triscaphe joint, thumb carpometacarpal joint, scapholunate, and lunotriquetral joint. There is increased T2 signal at the distal dorsal aspect of the lunate which may represent a chronic subchondral cyst. Bones/carpal alignment: Normal alignment. Other: None. IMPRESSION: 1. There appears to be a mildly complex ganglion extending in the volar direction from the medial wrist joint capsule just distal to the ulnar styloid attachment of the peripheral triangular fibrocartilage complex. The ganglion appears to be contiguous with joint fluid within the ulnar-proximal carpal row joint space. 2. Partial-thickness cartilage loss at the triscaphe joint, thumb carpometacarpal joint, scapholunate, and lunotriquetral joints. Increased T2 signal that may represent chronic cystic change within the distal dorsal aspect of the lunate. Electronically Signed   By: Neita Garnet M.D.   On: 04/27/2023 19:48   DG Pelvis Portable  Result Date: 04/27/2023 CLINICAL DATA:  Status post total left hip arthroplasty. EXAM: PORTABLE PELVIS 1-2 VIEWS COMPARISON:  AP pelvis 12/29/2022 FINDINGS: New total left hip arthroplasty. Expected postoperative changes including  lateral left hip subcutaneous air and surgical skin staples. Redemonstration of total right hip arthroplasty. No perihardware lucency is seen to indicate hardware failure or loosening. No acute fracture or dislocation. IMPRESSION: Status post total left hip arthroplasty with expected postoperative changes. Electronically Signed   By: Neita Garnet M.D.   On: 04/27/2023 19:36   DG HIP UNILAT WITH PELVIS 1V LEFT  Result Date: 04/27/2023 CLINICAL DATA:  Elective surgery. EXAM: DG HIP (WITH OR WITHOUT PELVIS) 1V*L* COMPARISON:  None Available. FINDINGS: Two fluoroscopic spot views of the pelvis and left hip obtained in the operating room. Images during hip arthroplasty. Fluoroscopy time 12 seconds. Dose 2.0152 mGy. IMPRESSION: Intraoperative fluoroscopy during left hip arthroplasty. Electronically Signed   By: Narda Rutherford M.D.   On: 04/27/2023 18:29   DG C-Arm 1-60 Min-No Report  Result Date: 04/27/2023 Fluoroscopy was utilized by the requesting physician.  No radiographic interpretation.    Disposition: Discharge disposition: 01-Home or Self Care          Follow-up Information     Kathryne Hitch, MD Follow up in 2 week(s).   Specialty: Orthopedic Surgery Contact information: [address] [place] [address] [phone]                  Signed: Kathryne Hitch 04/28/2023, 11:56 AM

## 2023-04-28 NOTE — TOC Transition Note (Signed)
Transition of Care [facility]) - CM/SW Discharge Note   Patient Details  Name: Connor Martin MRN: [id number] Date of Birth: [birth year]  [facility] CM/SW Contact:  Georgie Chard, LCSW Phone Number: 04/28/2023, 12:29 PM   Clinical Narrative:    CSW contacted [facility] patient is active with [facility] agency. CSW ordered platform for patient's left W which will be brought to to the patient's room. At this time there are no further TOC needs and the patient will DC home.          Patient Goals and CMS Choice      Discharge Placement                         Discharge Plan and Services Additional resources added to the After Visit Summary for                                       Social Determinants of Health (SDOH) Interventions SDOH Screenings   Food Insecurity: No Food Insecurity (04/27/2023)  Housing: Low Risk  (04/27/2023)  Transportation Needs: No Transportation Needs (04/27/2023)  Utilities: Not At Risk (04/27/2023)  Tobacco Use: Low Risk  (04/27/2023)     Readmission Risk Interventions     No data to display

## 2023-04-28 NOTE — Evaluation (Signed)
Physical Therapy Evaluation Patient Details Name: Connor Martin MRN: [id number] DOB: 04/13/1976 Today's Date: 04/28/2023  History of Present Illness  Pt is a 47 year old male s/p L THA, direct anterior approach on 04/27/23.  PMHx significant for but not limited to: R THA 12/29/22, anxiety, chronic LBP, HLD, OSA and recent left wrist pain (has sleeve/brace present at hospital)  Clinical Impression  Pt is s/p THA resulting in the deficits listed below (see PT Problem List).  Pt will benefit from acute skilled PT to increase their independence and safety with mobility to facilitate discharge.  Pt mobilizing well and performed LE exercises.  Pt with recent left wrist pain and reports pending work up but has sleeve/brace in place today.  Pt asking questions about forearm weight bearing attachment.  Will return for pt to trial platform RW however pt appears ready for d/c home today.          If plan is discharge home, recommend the following:     Can travel by private vehicle        Equipment Recommendations Other (comment) (pt may benefit from left platform attachment for his RW upon d/c due to left wrist pain currently wearing sleeve/brace)  Recommendations for Other Services       Functional Status Assessment Patient has had a recent decline in their functional status and demonstrates the ability to make significant improvements in function in a reasonable and predictable amount of time.     Precautions / Restrictions Precautions Precautions: Fall Restrictions Weight Bearing Restrictions: Yes LLE Weight Bearing: Weight bearing as tolerated      Mobility  Bed Mobility Overal bed mobility: Needs Assistance Bed Mobility: Supine to Sit     Supine to sit: Supervision, HOB elevated          Transfers Overall transfer level: Needs assistance Equipment used: Rolling walker (2 wheels) Transfers: Sit to/from Stand Sit to Stand: Min guard, Supervision           General  transfer comment: cues for UE and LE positioning for pain control    Ambulation/Gait Ambulation/Gait assistance: Min guard Gait Distance (Feet): 240 Feet Assistive device: Rolling walker (2 wheels) Gait Pattern/deviations: Step-to pattern, Decreased stance time - left, Antalgic       General Gait Details: verbal cues for sequence, RW positioning, posture  Stairs            Wheelchair Mobility     Tilt Bed    Modified Rankin (Stroke Patients Only)       Balance                                             Pertinent Vitals/Pain Pain Assessment Pain Assessment: 0-10 Pain Score: 5  Pain Location: left hip Pain Descriptors / Indicators: Sore, Burning Pain Intervention(s): Repositioned, Monitored during session    Home Living Family/patient expects to be discharged to:: Private residence   Available Help at Discharge: Family Type of Home: House Home Access: Ramped entrance       Home Layout: Able to live on main level with bedroom/bathroom Home Equipment: Agricultural consultant (2 wheels);Cane - single point Additional Comments: plans to stay at his mother's home as above upon d/c    Prior Function Prior Level of Function : Independent/Modified Independent;Working/employed;Driving  Hand Dominance        Extremity/Trunk Assessment        Lower Extremity Assessment Lower Extremity Assessment: LLE deficits/detail LLE Deficits / Details: anticipated post op hip weakness, able to perform ankle pumps       Communication   Communication: No difficulties  Cognition Arousal/Alertness: Awake/alert Behavior During Therapy: WFL for tasks assessed/performed Overall Cognitive Status: Within Functional Limits for tasks assessed                                          General Comments      Exercises Total Joint Exercises Ankle Circles/Pumps: AROM, Both, 10 reps Quad Sets: AROM, Both, 10 reps Heel  Slides: AAROM, Left, 10 reps, Supine Hip ABduction/ADduction: AAROM, Left, Supine, 10 reps Long Arc Quad: AROM, Left, Seated, 10 reps Knee Flexion: AROM, Standing, Left, 10 reps Standing Hip Extension: AROM, 10 reps, Left, Standing   Assessment/Plan    PT Assessment Patient needs continued PT services  PT Problem List Decreased strength;Decreased activity tolerance;Decreased balance;Decreased mobility;Pain;Decreased knowledge of use of DME       PT Treatment Interventions Stair training;Gait training;DME instruction;Therapeutic exercise;Functional mobility training;Therapeutic activities;Patient/family education    PT Goals (Current goals can be found in the Care Plan section)  Acute Rehab PT Goals PT Goal Formulation: With patient Time For Goal Achievement: 05/05/23 Potential to Achieve Goals: Good    Frequency 7X/week     Co-evaluation               AM-PAC PT "6 Clicks" Mobility  Outcome Measure Help needed turning from your back to your side while in a flat bed without using bedrails?: A Little Help needed moving from lying on your back to sitting on the side of a flat bed without using bedrails?: A Little Help needed moving to and from a bed to a chair (including a wheelchair)?: A Little Help needed standing up from a chair using your arms (e.g., wheelchair or bedside chair)?: A Little Help needed to walk in hospital room?: A Little Help needed climbing 3-5 steps with a railing? : A Little 6 Click Score: 18    End of Session Equipment Utilized During Treatment: Gait belt Activity Tolerance: Patient tolerated treatment well Patient left: in chair;with call bell/phone within reach Nurse Communication: Mobility status PT Visit Diagnosis: Difficulty in walking, not elsewhere classified (R26.2)    Time: 1610-9604 PT Time Calculation (min) (ACUTE ONLY): 23 min   Charges:   PT Evaluation $PT Eval Low Complexity: 1 Low PT Treatments $Therapeutic Exercise: 8-22  mins PT General Charges $$ ACUTE PT VISIT: 1 Visit        Paulino Door, DPT Physical Therapist Acute Rehabilitation Services Office: [phone]   Janan Halter Payson 04/28/2023, 12:43 PM

## 2023-04-28 NOTE — Plan of Care (Signed)

## 2023-04-30 ENCOUNTER — Encounter (HOSPITAL_COMMUNITY): Payer: Self-pay | Admitting: Orthopaedic Surgery

## 2023-04-30 ENCOUNTER — Other Ambulatory Visit (HOSPITAL_BASED_OUTPATIENT_CLINIC_OR_DEPARTMENT_OTHER): Payer: Self-pay

## 2023-05-01 ENCOUNTER — Other Ambulatory Visit (HOSPITAL_BASED_OUTPATIENT_CLINIC_OR_DEPARTMENT_OTHER): Payer: Self-pay

## 2023-05-01 DIAGNOSIS — F419 Anxiety disorder, unspecified: Secondary | ICD-10-CM | POA: Diagnosis not present

## 2023-05-01 DIAGNOSIS — K760 Fatty (change of) liver, not elsewhere classified: Secondary | ICD-10-CM | POA: Diagnosis not present

## 2023-05-01 DIAGNOSIS — F988 Other specified behavioral and emotional disorders with onset usually occurring in childhood and adolescence: Secondary | ICD-10-CM | POA: Diagnosis not present

## 2023-05-01 DIAGNOSIS — Z96642 Presence of left artificial hip joint: Secondary | ICD-10-CM | POA: Diagnosis not present

## 2023-05-01 DIAGNOSIS — G43909 Migraine, unspecified, not intractable, without status migrainosus: Secondary | ICD-10-CM | POA: Diagnosis not present

## 2023-05-01 DIAGNOSIS — G4730 Sleep apnea, unspecified: Secondary | ICD-10-CM | POA: Diagnosis not present

## 2023-05-01 DIAGNOSIS — Z471 Aftercare following joint replacement surgery: Secondary | ICD-10-CM | POA: Diagnosis not present

## 2023-05-01 DIAGNOSIS — Z96641 Presence of right artificial hip joint: Secondary | ICD-10-CM | POA: Diagnosis not present

## 2023-05-01 DIAGNOSIS — K219 Gastro-esophageal reflux disease without esophagitis: Secondary | ICD-10-CM | POA: Diagnosis not present

## 2023-05-01 DIAGNOSIS — E785 Hyperlipidemia, unspecified: Secondary | ICD-10-CM | POA: Diagnosis not present

## 2023-05-02 ENCOUNTER — Other Ambulatory Visit (HOSPITAL_BASED_OUTPATIENT_CLINIC_OR_DEPARTMENT_OTHER): Payer: Self-pay

## 2023-05-02 MED ORDER — OXYCODONE-ACETAMINOPHEN 7.5-325 MG PO TABS
1.0000 | ORAL_TABLET | Freq: Two times a day (BID) | ORAL | 0 refills | Status: DC
  Filled 2023-05-02: qty 60, 30d supply, fill #0

## 2023-05-03 ENCOUNTER — Encounter: Payer: Self-pay | Admitting: Orthopaedic Surgery

## 2023-05-04 ENCOUNTER — Telehealth: Payer: Self-pay | Admitting: Internal Medicine

## 2023-05-04 DIAGNOSIS — F419 Anxiety disorder, unspecified: Secondary | ICD-10-CM | POA: Diagnosis not present

## 2023-05-04 DIAGNOSIS — K219 Gastro-esophageal reflux disease without esophagitis: Secondary | ICD-10-CM | POA: Diagnosis not present

## 2023-05-04 DIAGNOSIS — K760 Fatty (change of) liver, not elsewhere classified: Secondary | ICD-10-CM | POA: Diagnosis not present

## 2023-05-04 DIAGNOSIS — G4730 Sleep apnea, unspecified: Secondary | ICD-10-CM | POA: Diagnosis not present

## 2023-05-04 DIAGNOSIS — Z471 Aftercare following joint replacement surgery: Secondary | ICD-10-CM | POA: Diagnosis not present

## 2023-05-04 DIAGNOSIS — Z96642 Presence of left artificial hip joint: Secondary | ICD-10-CM | POA: Diagnosis not present

## 2023-05-04 DIAGNOSIS — G43909 Migraine, unspecified, not intractable, without status migrainosus: Secondary | ICD-10-CM | POA: Diagnosis not present

## 2023-05-04 DIAGNOSIS — Z96641 Presence of right artificial hip joint: Secondary | ICD-10-CM | POA: Diagnosis not present

## 2023-05-04 DIAGNOSIS — E785 Hyperlipidemia, unspecified: Secondary | ICD-10-CM | POA: Diagnosis not present

## 2023-05-04 DIAGNOSIS — F988 Other specified behavioral and emotional disorders with onset usually occurring in childhood and adolescence: Secondary | ICD-10-CM | POA: Diagnosis not present

## 2023-05-04 NOTE — Telephone Encounter (Signed)
Pt calling in for sleep results

## 2023-05-07 DIAGNOSIS — K760 Fatty (change of) liver, not elsewhere classified: Secondary | ICD-10-CM | POA: Diagnosis not present

## 2023-05-07 DIAGNOSIS — Z96642 Presence of left artificial hip joint: Secondary | ICD-10-CM | POA: Diagnosis not present

## 2023-05-07 DIAGNOSIS — Z471 Aftercare following joint replacement surgery: Secondary | ICD-10-CM | POA: Diagnosis not present

## 2023-05-07 DIAGNOSIS — Z96641 Presence of right artificial hip joint: Secondary | ICD-10-CM | POA: Diagnosis not present

## 2023-05-07 DIAGNOSIS — F988 Other specified behavioral and emotional disorders with onset usually occurring in childhood and adolescence: Secondary | ICD-10-CM | POA: Diagnosis not present

## 2023-05-07 DIAGNOSIS — E785 Hyperlipidemia, unspecified: Secondary | ICD-10-CM | POA: Diagnosis not present

## 2023-05-07 DIAGNOSIS — F419 Anxiety disorder, unspecified: Secondary | ICD-10-CM | POA: Diagnosis not present

## 2023-05-07 DIAGNOSIS — K219 Gastro-esophageal reflux disease without esophagitis: Secondary | ICD-10-CM | POA: Diagnosis not present

## 2023-05-07 DIAGNOSIS — G43909 Migraine, unspecified, not intractable, without status migrainosus: Secondary | ICD-10-CM | POA: Diagnosis not present

## 2023-05-07 DIAGNOSIS — G4730 Sleep apnea, unspecified: Secondary | ICD-10-CM | POA: Diagnosis not present

## 2023-05-08 ENCOUNTER — Ambulatory Visit (INDEPENDENT_AMBULATORY_CARE_PROVIDER_SITE_OTHER): Payer: 59 | Admitting: Orthopedic Surgery

## 2023-05-08 DIAGNOSIS — M67432 Ganglion, left wrist: Secondary | ICD-10-CM | POA: Diagnosis not present

## 2023-05-08 DIAGNOSIS — G5622 Lesion of ulnar nerve, left upper limb: Secondary | ICD-10-CM

## 2023-05-08 NOTE — Progress Notes (Signed)
Connor Martin - 47 y.o. male MRN [id number]  Date of birth: 09-08-1976  Office Visit Note: Visit Date: 05/08/2023 PCP: Kerry Fort, MD Referred by: Kerry Fort, MD  Subjective: Chief Complaint  Patient presents with   Left Wrist - Pain   HPI: Connor Martin is a pleasant 47 y.o. male who presents today for evaluation of ongoing left ulnar-sided wrist pain as well as numbness and tingling in the ulnar sided digits over the past multiple years.  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 Magnus Ivan over this past year.  He states that the pain can be activity related, particularly with loading activities or impact to the left wrist.  The numbness and tingling is intermittent.   Pertinent ROS were reviewed with the patient and found to be negative unless otherwise specified above in HPI.   Assessment & Plan: Visit Diagnoses: No diagnosis found.  Plan: Extensive discussion was had the patient today regarding his left wrist and hand complaints.  I reviewed the results of his prior workup including his prior x-ray and MRI study.  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  He is significantly tender in this region as well.  We discussed treatment options ranging from conservative to surgical.  From a conservative standpoint, he could continue with bracing and activity modification as needed.  From a surgical standpoint, he would be indicated for left wrist ganglion cyst excision as well as Guyon's canal decompression given his ongoing symptoms in this region.  I would recommend that he recover from his recent hip replacement prior to upper extremity surgery which he is in agreement with.  He will return to me in approximate 6 weeks for repeat discussion and potential surgical planning.    Follow-up: Return in about 6 weeks  (around 06/19/2023) for left wrist.   Meds & Orders: No orders of the defined types were placed in this encounter.  No orders of the defined types were placed in this encounter.    Procedures: No procedures performed      Clinical History: No specialty comments available.  He reports that he has never smoked. He has never used smokeless tobacco.  Recent Labs    04/28/23 0413  HGBA1C 6.0*    Objective:   Vital Signs: There were no vitals taken for this visit.  Physical Exam  Gen: Well-appearing, in no acute distress; non-toxic CV: Regular Rate. Well-perfused. Warm.  Resp: Breathing unlabored on room air; no wheezing. Psych: Fluid speech in conversation; appropriate affect; normal thought process Neuro: Sensation intact throughout. No gross coordination deficits.   Ortho Exam PHYSICAL EXAM:  General: Patient is well appearing and in no distress. Cervical spine mobility is full in all directions:  Skin and Muscle: No skin changes are apparent to upper extremities.  Muscle bulk and contour normal, no signs of atrophy.     Range of Motion and Palpation Tests: Mobility is full about the elbows with flexion and extension.  Forearm supination and pronation are 85/85 bilaterally.  Wrist flexion/extension is 75/65 bilaterally.  Digital flexion and extension are full.  Thumb opposition is full to the base of the small fingers bilaterally.    Tenderness elicited over the TFCC region with deep palpation particular at the volar aspect  No cords or nodules are palpated.  No triggering is observed.    No tenderness over the thumb CMC articulations is observed.  Scaphoid shift test is negative.  Finklestein test is negative.  Ulnar impingement test is negative bilaterally.  No evidence of radiocarpal, midcarpal or intercarpal joint instability with provocation.  Neurologic, Vascular, Motor: Sensation is intact to light touch in the median/radial distributions, slightly diminished in  distribution left hand.  Tinel's testing positive at left Guyon's canal.  Phalen's negative, Derkan's compression negative Fingers pink and well perfused.  Capillary refill is brisk.      Lab Results  Component Value Date   HGBA1C 6.0 (H) 04/28/2023     Imaging: No results found.  Past Medical/Family/Surgical/Social History: Medications & Allergies reviewed per EMR, new medications updated. 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/29/2022   Avascular necrosis of bone of left hip (HCC) 10/11/2022   Dyslipidemia 02/25/2020   ADHD, 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   Adjustment disorder with depressed mood 02/25/2020   Nutritional anemia 02/25/2020   Fatty liver 09/02/2016   History of panic attacks 08/30/2016   GAD (generalized anxiety disorder) 08/30/2016   Gastroesophageal reflux disease without esophagitis    High blood triglycerides    Pancreatitis 04/07/2016   Abnormal LFTs 04/06/2016   Alcohol use 04/06/2016   Cough 04/06/2016   Pancreatitis, acute 04/06/2016   Panic attacks    GERD (gastroesophageal reflux disease)    Past Medical History:  Diagnosis Date   Acute pancreatitis hospitalized 04/06/2016   Acute sinusitis    ADD (attention deficit disorder)    Anxiety    Arthritis    Avascular necrosis of bone of hip (HCC)    Blood in stool    Bronchitis    Chronic lower back pain    Depression    Dyslipidemia    Family history of colon cancer    Fatty liver    GERD (gastroesophageal reflux disease)    Headache    "@ least once/week" (04/07/2016)   Hypertriglyceridemia    Hypertriglyceridemia    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    Migraine    "q other month" (04/07/2016)   Nasal congestion    Palpitations    Pancreatitis    Panic attacks    Pneumonia ~ 1981   Sleep apnea    Snoring    Family History  Problem Relation Age of Onset    Hypothyroidism Mother    Heart disease Mother    Diabetes Mother    ALS Father    Past Surgical History:  Procedure Laterality Date   TONSILLECTOMY AND ADENOIDECTOMY  ~ 1990   TOTAL HIP ARTHROPLASTY Right 12/29/2022   Procedure: RIGHT TOTAL HIP ARTHROPLASTY ANTERIOR APPROACH;  Surgeon: Kathryne Hitch, MD;  Location: [facility];  Service: Orthopedics;  Laterality: Right;   TOTAL HIP ARTHROPLASTY Left 04/27/2023   Procedure: LEFT TOTAL HIP ARTHROPLASTY ANTERIOR APPROACH;  Surgeon: Kathryne Hitch, MD;  Location: [facility];  Service: Orthopedics;  Laterality: Left;   Social History   Occupational History    Comment: Merchandiser, retail  Tobacco Use   Smoking status: Never   Smokeless tobacco: Never  Vaping Use   Vaping status: Never Used  Substance and Sexual Activity   Alcohol use: Yes    Alcohol/week: 10.0 standard drinks of alcohol    Types: 10 Shots of liquor per week    Comment:  social   Drug use: No   Sexual activity: Yes    Denise Bramblett Fara Boros) Shantavia Jha, M.D. [facility] 10:33 AM

## 2023-05-09 DIAGNOSIS — F419 Anxiety disorder, unspecified: Secondary | ICD-10-CM | POA: Diagnosis not present

## 2023-05-09 DIAGNOSIS — E785 Hyperlipidemia, unspecified: Secondary | ICD-10-CM | POA: Diagnosis not present

## 2023-05-09 DIAGNOSIS — K760 Fatty (change of) liver, not elsewhere classified: Secondary | ICD-10-CM | POA: Diagnosis not present

## 2023-05-09 DIAGNOSIS — Z96642 Presence of left artificial hip joint: Secondary | ICD-10-CM | POA: Diagnosis not present

## 2023-05-09 DIAGNOSIS — F988 Other specified behavioral and emotional disorders with onset usually occurring in childhood and adolescence: Secondary | ICD-10-CM | POA: Diagnosis not present

## 2023-05-09 DIAGNOSIS — G4730 Sleep apnea, unspecified: Secondary | ICD-10-CM | POA: Diagnosis not present

## 2023-05-09 DIAGNOSIS — G43909 Migraine, unspecified, not intractable, without status migrainosus: Secondary | ICD-10-CM | POA: Diagnosis not present

## 2023-05-09 DIAGNOSIS — Z96641 Presence of right artificial hip joint: Secondary | ICD-10-CM | POA: Diagnosis not present

## 2023-05-09 DIAGNOSIS — K219 Gastro-esophageal reflux disease without esophagitis: Secondary | ICD-10-CM | POA: Diagnosis not present

## 2023-05-09 DIAGNOSIS — Z471 Aftercare following joint replacement surgery: Secondary | ICD-10-CM | POA: Diagnosis not present

## 2023-05-10 ENCOUNTER — Encounter: Payer: Self-pay | Admitting: Physician Assistant

## 2023-05-10 ENCOUNTER — Other Ambulatory Visit (HOSPITAL_BASED_OUTPATIENT_CLINIC_OR_DEPARTMENT_OTHER): Payer: Self-pay

## 2023-05-10 ENCOUNTER — Ambulatory Visit (INDEPENDENT_AMBULATORY_CARE_PROVIDER_SITE_OTHER): Payer: 59 | Admitting: Physician Assistant

## 2023-05-10 ENCOUNTER — Other Ambulatory Visit: Payer: Self-pay | Admitting: Orthopaedic Surgery

## 2023-05-10 ENCOUNTER — Other Ambulatory Visit (INDEPENDENT_AMBULATORY_CARE_PROVIDER_SITE_OTHER): Payer: 59

## 2023-05-10 DIAGNOSIS — Z96642 Presence of left artificial hip joint: Secondary | ICD-10-CM

## 2023-05-10 MED ORDER — TIZANIDINE HCL 4 MG PO TABS
4.0000 mg | ORAL_TABLET | Freq: Four times a day (QID) | ORAL | 0 refills | Status: DC | PRN
  Filled 2023-05-10: qty 30, 8d supply, fill #0

## 2023-05-10 MED ORDER — HYDROMORPHONE HCL 4 MG PO TABS
4.0000 mg | ORAL_TABLET | ORAL | 0 refills | Status: DC | PRN
  Filled 2023-05-10: qty 30, 5d supply, fill #0

## 2023-05-10 NOTE — Progress Notes (Addendum)
Office Visit Note   Patient: Connor Martin           Date of Birth: Jul 01, 1976           MRN: [id number] Visit Date: 05/10/2023              Requested by: Kerry Fort, MD [address] [place],  [place] [address] PCP: Kerry Fort, MD   Assessment & Plan: Visit Diagnoses:  1. History of total left hip arthroplasty     Plan: Staples were removed Steri-Strips applied.  He is able to shower and get the incision wet no submerging of the wound.  Refills on pain medication and his muscle relaxant were given.  He will take an 81 mg aspirin once daily for another week and then discontinue as he was on no aspirin prior to surgery.  Will see him back in 2 weeks sooner if there is any questions or concerns.  Return back early due to his sensation of the hip popping out.  Again reviewed radiographs with him today there was no sign of dislocation.  Will have him to anterior hip precautions and these were reviewed with the patient.  Follow-Up Instructions: Return in about 2 weeks (around 05/24/2023).   Orders:  Orders Placed This Encounter  Procedures   XR HIP UNILAT W OR W/O PELVIS 2-3 VIEWS LEFT   Meds ordered this encounter  Medications   HYDROmorphone (DILAUDID) 4 MG tablet    Sig: Take 1 tablet (4 mg total) by mouth every 4 (four) hours as needed for severe pain.    Dispense:  30 tablet    Refill:  0      Procedures: No procedures performed   Clinical Data: No additional findings.   Subjective: Chief Complaint  Patient presents with   Left Hip - Routine Post Op    04/27/23 left THA    HPI Connor Martin comes in today status post left total hip arthroplasty 8-24.  He states that this past Saturday he thought he may have dislocated his hip.  He states that he turned abruptly and felt something pop in and out.  He feels something might be loose in his hip and moving.  He is having 10 out of 10 pain at worst.  No fevers or chills. Review of Systems See HPI otherwise  negative  Objective: Vital Signs: There were no vitals taken for this visit.  Physical Exam General: No acute distress mood and affect appropriate Ortho Exam Left hip fluid range of motion without pain.  Left calf supple nontender.  Dorsiflexion plantarflexion left ankle intact.  Left hip Staples well-approximated the incision there is no signs of infection. Specialty Comments:  No specialty comments available.  Imaging: XR HIP UNILAT W OR W/O PELVIS 2-3 VIEWS LEFT  Result Date: 05/10/2023 AP pelvis and lateral view left hip: Status post bilateral total hip arthroplasties.  Right and left hip components appear well-seated.  No acute fractures or acute findings.  No change in overall position alignment particularly the left hip from the postoperative films.    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   Adjustment disorder with depressed mood 02/25/2020   Nutritional anemia 02/25/2020   Fatty liver 09/02/2016  History of panic attacks 08/30/2016   GAD (generalized anxiety disorder) 08/30/2016   Gastroesophageal reflux disease without esophagitis    High blood triglycerides    Pancreatitis 04/07/2016   Abnormal LFTs 04/06/2016   Alcohol use 04/06/2016   Cough 04/06/2016   Pancreatitis, acute 04/06/2016   Panic attacks    GERD (gastroesophageal reflux disease)    Past Medical History:  Diagnosis Date   Acute pancreatitis hospitalized 04/06/2016   Acute sinusitis    ADD (attention deficit disorder)    Anxiety    Arthritis    Avascular necrosis of bone of hip (HCC)    Blood in stool    Bronchitis    Chronic lower back pain    Depression    Dyslipidemia    Family history of colon cancer    Fatty liver    GERD  (gastroesophageal reflux disease)    Headache    "@ least once/week" (04/07/2016)   Hypertriglyceridemia    Hypertriglyceridemia    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    Migraine    "q other month" (04/07/2016)   Nasal congestion    Palpitations    Pancreatitis    Panic attacks    Pneumonia ~ 1981   Sleep apnea    Snoring     Family History  Problem Relation Age of Onset   Hypothyroidism Mother    Heart disease Mother    Diabetes Mother    ALS Father     Past Surgical History:  Procedure Laterality Date   TONSILLECTOMY AND ADENOIDECTOMY  ~ 1990   TOTAL HIP ARTHROPLASTY Right 12/29/2022   Procedure: RIGHT TOTAL HIP ARTHROPLASTY ANTERIOR APPROACH;  Surgeon: Kathryne Hitch, MD;  Location: [facility];  Service: Orthopedics;  Laterality: Right;   TOTAL HIP ARTHROPLASTY Left 04/27/2023   Procedure: LEFT TOTAL HIP ARTHROPLASTY ANTERIOR APPROACH;  Surgeon: Kathryne Hitch, MD;  Location: [facility];  Service: Orthopedics;  Laterality: Left;   Social History   Occupational History    Comment: Merchandiser, retail  Tobacco Use   Smoking status: Never   Smokeless tobacco: Never  Vaping Use   Vaping status: Never Used  Substance and Sexual Activity   Alcohol use: Yes    Alcohol/week: 10.0 standard drinks of alcohol    Types: 10 Shots of liquor per week    Comment: social   Drug use: No   Sexual activity: Yes

## 2023-05-11 ENCOUNTER — Encounter (HOSPITAL_BASED_OUTPATIENT_CLINIC_OR_DEPARTMENT_OTHER): Payer: Self-pay

## 2023-05-11 NOTE — Telephone Encounter (Signed)
Results sent via my chart 

## 2023-05-11 NOTE — Telephone Encounter (Signed)
Sleep study showed severe obstructive sleep apnea, averaging 54 apneas/ hour, with drops in blood oxygen level. Results were sent to ordering physician. Recommended treatment would be CPAP. I had seen this gentleman many years ago. I can certainly see him back to manage his treatment if he wishes.

## 2023-05-12 ENCOUNTER — Other Ambulatory Visit (HOSPITAL_BASED_OUTPATIENT_CLINIC_OR_DEPARTMENT_OTHER): Payer: Self-pay

## 2023-05-14 DIAGNOSIS — F419 Anxiety disorder, unspecified: Secondary | ICD-10-CM | POA: Diagnosis not present

## 2023-05-14 DIAGNOSIS — E785 Hyperlipidemia, unspecified: Secondary | ICD-10-CM | POA: Diagnosis not present

## 2023-05-14 DIAGNOSIS — F988 Other specified behavioral and emotional disorders with onset usually occurring in childhood and adolescence: Secondary | ICD-10-CM | POA: Diagnosis not present

## 2023-05-14 DIAGNOSIS — G4730 Sleep apnea, unspecified: Secondary | ICD-10-CM | POA: Diagnosis not present

## 2023-05-14 DIAGNOSIS — G43909 Migraine, unspecified, not intractable, without status migrainosus: Secondary | ICD-10-CM | POA: Diagnosis not present

## 2023-05-14 DIAGNOSIS — K219 Gastro-esophageal reflux disease without esophagitis: Secondary | ICD-10-CM | POA: Diagnosis not present

## 2023-05-14 DIAGNOSIS — Z471 Aftercare following joint replacement surgery: Secondary | ICD-10-CM | POA: Diagnosis not present

## 2023-05-14 DIAGNOSIS — K760 Fatty (change of) liver, not elsewhere classified: Secondary | ICD-10-CM | POA: Diagnosis not present

## 2023-05-14 DIAGNOSIS — Z96642 Presence of left artificial hip joint: Secondary | ICD-10-CM | POA: Diagnosis not present

## 2023-05-14 DIAGNOSIS — Z96641 Presence of right artificial hip joint: Secondary | ICD-10-CM | POA: Diagnosis not present

## 2023-05-19 DIAGNOSIS — Z471 Aftercare following joint replacement surgery: Secondary | ICD-10-CM | POA: Diagnosis not present

## 2023-05-19 DIAGNOSIS — G43909 Migraine, unspecified, not intractable, without status migrainosus: Secondary | ICD-10-CM | POA: Diagnosis not present

## 2023-05-19 DIAGNOSIS — K219 Gastro-esophageal reflux disease without esophagitis: Secondary | ICD-10-CM | POA: Diagnosis not present

## 2023-05-19 DIAGNOSIS — Z96642 Presence of left artificial hip joint: Secondary | ICD-10-CM | POA: Diagnosis not present

## 2023-05-19 DIAGNOSIS — K760 Fatty (change of) liver, not elsewhere classified: Secondary | ICD-10-CM | POA: Diagnosis not present

## 2023-05-19 DIAGNOSIS — E785 Hyperlipidemia, unspecified: Secondary | ICD-10-CM | POA: Diagnosis not present

## 2023-05-19 DIAGNOSIS — F419 Anxiety disorder, unspecified: Secondary | ICD-10-CM | POA: Diagnosis not present

## 2023-05-19 DIAGNOSIS — F988 Other specified behavioral and emotional disorders with onset usually occurring in childhood and adolescence: Secondary | ICD-10-CM | POA: Diagnosis not present

## 2023-05-19 DIAGNOSIS — G4730 Sleep apnea, unspecified: Secondary | ICD-10-CM | POA: Diagnosis not present

## 2023-05-19 DIAGNOSIS — Z96641 Presence of right artificial hip joint: Secondary | ICD-10-CM | POA: Diagnosis not present

## 2023-05-24 ENCOUNTER — Ambulatory Visit (INDEPENDENT_AMBULATORY_CARE_PROVIDER_SITE_OTHER): Payer: 59 | Admitting: Physician Assistant

## 2023-05-24 ENCOUNTER — Encounter: Payer: Self-pay | Admitting: Physician Assistant

## 2023-05-24 DIAGNOSIS — Z96642 Presence of left artificial hip joint: Secondary | ICD-10-CM

## 2023-05-24 NOTE — Progress Notes (Signed)
HPI: Connor Martin comes in today for follow-up of his left hip.  He states that he has had no sensation of the hip dislocating again.  But he does have a popping sensation at times.  He notes his pain is from 1-5 out of 10 most of the time.  However he did have 1 incident where he stumbled but did not fall and had 10 out of 10 pain in the left hip.  He also notes his right hip which has been replaced is starting to bother him some.  He is taking no pain medications at this point in time.  He is taking Neurontin and using his muscle relaxant occasionally.  He is going to the beach today.  Preemptively took an aspirin yesterday and an aspirin today due to the fact that he be driving he is wearing compression stockings.  Physical exam: General well-developed well-nourished male who ambulates without any assistive device.  Bilateral hips full range of motion.  He still unable to cross his legs bilaterally.  Dorsiflexion plantarflexion bilateral ankles intact.  Left hip incisions healing well no signs of infection.  Some mild serosanguineous drainage from the mid incision but there is no dehiscence.  Impression: Status post left total hip arthroplasty 04-27-23  Plan: Will have him apply Bactroban after washing the incision daily.  He is to apply just a thin amount.  Reminded him that he should not submerge the incision until it is completely healed.  Continue to work on range of motion and strengthening.  Recommend that he take Aleve twice daily given the pain that he is having in both hips.  Will have him follow-up with Dr. Magnus Ivan in 1 month sooner if there is any questions concerns.

## 2023-06-08 ENCOUNTER — Other Ambulatory Visit (HOSPITAL_BASED_OUTPATIENT_CLINIC_OR_DEPARTMENT_OTHER): Payer: Self-pay

## 2023-06-11 ENCOUNTER — Other Ambulatory Visit (HOSPITAL_BASED_OUTPATIENT_CLINIC_OR_DEPARTMENT_OTHER): Payer: Self-pay

## 2023-06-11 ENCOUNTER — Other Ambulatory Visit: Payer: Self-pay

## 2023-06-11 MED ORDER — AMPHETAMINE-DEXTROAMPHETAMINE 20 MG PO TABS
20.0000 mg | ORAL_TABLET | Freq: Three times a day (TID) | ORAL | 0 refills | Status: AC
  Filled 2023-06-11 – 2023-06-27 (×2): qty 90, 30d supply, fill #0

## 2023-06-12 ENCOUNTER — Other Ambulatory Visit: Payer: Self-pay

## 2023-06-13 ENCOUNTER — Other Ambulatory Visit (HOSPITAL_BASED_OUTPATIENT_CLINIC_OR_DEPARTMENT_OTHER): Payer: Self-pay

## 2023-06-13 ENCOUNTER — Other Ambulatory Visit: Payer: Self-pay

## 2023-06-14 ENCOUNTER — Other Ambulatory Visit: Payer: Self-pay

## 2023-06-15 ENCOUNTER — Other Ambulatory Visit: Payer: Self-pay

## 2023-06-15 ENCOUNTER — Other Ambulatory Visit (HOSPITAL_BASED_OUTPATIENT_CLINIC_OR_DEPARTMENT_OTHER): Payer: Self-pay

## 2023-06-18 ENCOUNTER — Other Ambulatory Visit: Payer: Self-pay

## 2023-06-18 ENCOUNTER — Other Ambulatory Visit (HOSPITAL_BASED_OUTPATIENT_CLINIC_OR_DEPARTMENT_OTHER): Payer: Self-pay

## 2023-06-19 ENCOUNTER — Other Ambulatory Visit: Payer: Self-pay

## 2023-06-20 ENCOUNTER — Encounter (HOSPITAL_BASED_OUTPATIENT_CLINIC_OR_DEPARTMENT_OTHER): Payer: Self-pay

## 2023-06-20 ENCOUNTER — Other Ambulatory Visit (HOSPITAL_BASED_OUTPATIENT_CLINIC_OR_DEPARTMENT_OTHER): Payer: Self-pay

## 2023-06-22 ENCOUNTER — Other Ambulatory Visit (HOSPITAL_BASED_OUTPATIENT_CLINIC_OR_DEPARTMENT_OTHER): Payer: Self-pay

## 2023-06-22 ENCOUNTER — Other Ambulatory Visit: Payer: Self-pay

## 2023-06-22 ENCOUNTER — Ambulatory Visit: Payer: 59 | Admitting: Orthopedic Surgery

## 2023-06-25 ENCOUNTER — Other Ambulatory Visit (HOSPITAL_BASED_OUTPATIENT_CLINIC_OR_DEPARTMENT_OTHER): Payer: Self-pay

## 2023-06-25 NOTE — Progress Notes (Deleted)
06/27/23- Virtual Visit via Video Note  I connected with Connor Martin on 06/25/23 at 10:00 AM EDT by a video enabled telemedicine application and verified that I am speaking with the correct person using two identifiers.  Location: Patient: *** Provider: ***   I discussed the limitations of evaluation and management by telemedicine and the availability of in person appointments. The patient expressed understanding and agreed to proceed.  History of Present Illness:47 yoM with concern of OSA, last seen in 2007 and currently referred by Dr Kerry Fort.  Medical problem list includes Migraine, Acute Pancreatitis, GERD, Low back pain/ sciatica, Avascular Necrosis/ L Hip Replacement, Panic Attacks, Hyperlipidemia,ETOH, Depression, ADHD, TIA,     Observations/Objective: HST 10/18/22 AHI 50.4/hr, desaturation to a nadir of 78%, body weight 218 lbs  Assessment and Plan:   Follow Up Instructions:    I discussed the assessment and treatment plan with the patient. The patient was provided an opportunity to ask questions and all were answered. The patient agreed with the plan and demonstrated an understanding of the instructions.   The patient was advised to call back or seek an in-person evaluation if the symptoms worsen or if the condition fails to improve as anticipated.  I provided *** minutes of non-face-to-face time during this encounter.   Jetty Duhamel, MD

## 2023-06-26 ENCOUNTER — Telehealth: Payer: 59 | Admitting: Internal Medicine

## 2023-06-26 ENCOUNTER — Other Ambulatory Visit: Payer: Self-pay

## 2023-06-26 ENCOUNTER — Ambulatory Visit: Payer: 59 | Admitting: Internal Medicine

## 2023-06-26 ENCOUNTER — Encounter: Payer: Self-pay | Admitting: Internal Medicine

## 2023-06-26 VITALS — BP 124/84 | HR 95 | Ht 74.0 in | Wt 226.0 lb

## 2023-06-26 DIAGNOSIS — G4733 Obstructive sleep apnea (adult) (pediatric): Secondary | ICD-10-CM

## 2023-06-26 DIAGNOSIS — F410 Panic disorder [episodic paroxysmal anxiety] without agoraphobia: Secondary | ICD-10-CM | POA: Diagnosis not present

## 2023-06-26 NOTE — Progress Notes (Unsigned)
06/26/23- 47 yoM never smoker for sleep evaluation for OSA, courtesy of Dr Duanne Limerick who was his previous physician while living in [place]. Epworth score 1 HST 10/18/22- AHI 50.4/hr, Desat to 78%, body weight 218 lbs Hx loud snoring, unrested. 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 ENT surgery for T&A. Hx nasal fx. Remote viral pancreatitis. Remote sleep study around 2007- results unavailable, not treated then.  Prior to Admission medications   Medication Sig Start Date End Date Taking? Authorizing Provider  ALPRAZolam Prudy Feeler) 0.5 MG tablet Take 1 tablet (0.5 mg total) by mouth 2 (two) times daily as needed. 02/01/23  Yes   amphetamine-dextroamphetamine (ADDERALL) 20 MG tablet Take 1 tablet (20 mg total) by mouth 3 (three) times daily. 06/11/23  Yes   betamethasone dipropionate (DIPROLENE) 0.05 % ointment Apply a thin layer to affected area(s) twice daily. 01/22/23  Yes   Biotin 1 MG CAPS Take 1 mg by mouth daily at 12 noon.   Yes [provider]  esomeprazole (NEXIUM) 40 MG capsule Take 1 capsule (40 mg total) by mouth daily. 04/24/23  Yes   fenofibrate 160 MG tablet Take 1 tablet (160 mg total) by mouth daily. 04/24/23  Yes   gabapentin (NEURONTIN) 100 MG capsule Take 1 capsule (100 mg total) by mouth 3 (three) times daily as needed. 04/28/23 04/27/24 Yes Kathryne Hitch, MD  imiquimod Mathis Dad) 5 % cream Apply topically 5 nights per week, use occlusive dressing, wash off after 8-10 hours. 08/21/22  Yes   levocetirizine (XYZAL) 5 MG tablet Take 1 tablet (5 mg total) by mouth every evening. 12/06/22  Yes   magnesium oxide (MAG-OX) 400 MG tablet Take 400 mg by mouth daily.   Yes [provider]  Milk Thistle 200 MG CAPS Take 200 mg by mouth daily.   Yes [provider]  Multiple Vitamin (MULTIVITAMIN WITH MINERALS) TABS tablet Take 1 tablet by mouth daily. 04/10/16  Yes Vassie Loll, MD  omega-3 acid ethyl esters  (LOVAZA) 1 g capsule take 2 capsule by oral route 2 times every day 02/28/22  Yes   Rimegepant Sulfate (NURTEC) 75 MG TBDP Take 75 mg by mouth as needed. 04/17/22  Yes Ocie Doyne, MD  tiZANidine (ZANAFLEX) 4 MG tablet Take 1 tablet (4 mg total) by mouth every 6 (six) hours as needed for muscle spasms. 05/10/23  Yes Kirtland Bouchard, PA-C  vitamin E 1000 UNIT capsule Take 1,000 Units by mouth daily.   Yes [provider]  Zinc Gluconate 100 MG TABS Take 100 mg by mouth daily.   Yes [provider]  ALPRAZolam (XANAX) 0.5 MG tablet Take 1 tablet (0.5 mg total) by mouth 2 (two) times daily as needed. 06/27/23     amphetamine-dextroamphetamine (ADDERALL) 20 MG tablet Take 1 tablet (20 mg total) by mouth 3 (three) times daily. 06/27/23     esomeprazole (NEXIUM) 40 MG capsule Take 1 capsule (40 mg total) by mouth daily. 06/27/23     fenofibrate 160 MG tablet TAKE 1 TABLET BY MOUTH EVERY DAY 06/27/23     levocetirizine (XYZAL) 5 MG tablet Take 1 tablet (5 mg total) by mouth every evening. 06/27/23   Kerry Fort, MD  omega-3 acid ethyl esters (LOVAZA) 1 g capsule Take 2 capsules by mouth 2 times daily. 06/27/23     oxyCODONE-acetaminophen (PERCOCET) 7.5-325 MG tablet Take 1 tablet by mouth every 12 (twelve) hours as needed. 06/27/23      Past  Medical History:  Diagnosis Date   Acute pancreatitis hospitalized 04/06/2016   Acute sinusitis    ADD (attention deficit disorder)    Anxiety    Arthritis    Avascular necrosis of bone of hip (HCC)    Blood in stool    Bronchitis    Chronic lower back pain    Depression    Dyslipidemia    Family history of colon cancer    Fatty liver    GERD (gastroesophageal reflux disease)    Headache    "@ least once/week" (04/07/2016)   Hypertriglyceridemia    Hypertriglyceridemia    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    Migraine    "q other month" (04/07/2016)   Nasal congestion    Palpitations    Pancreatitis    Panic attacks    Pneumonia ~  1981   Sleep apnea    Snoring    Past Surgical History:  Procedure Laterality Date   TONSILLECTOMY AND ADENOIDECTOMY  ~ 1990   TOTAL HIP ARTHROPLASTY Right 12/29/2022   Procedure: RIGHT TOTAL HIP ARTHROPLASTY ANTERIOR APPROACH;  Surgeon: Kathryne Hitch, MD;  Location: [facility];  Service: Orthopedics;  Laterality: Right;   TOTAL HIP ARTHROPLASTY Left 04/27/2023   Procedure: LEFT TOTAL HIP ARTHROPLASTY ANTERIOR APPROACH;  Surgeon: Kathryne Hitch, MD;  Location: [facility];  Service: Orthopedics;  Laterality: Left;   Family History  Problem Relation Age of Onset   Hypothyroidism Mother    Heart disease Mother    Diabetes Mother    ALS Father    Social History   Socioeconomic History   Marital status: Legally Separated    Spouse name: Miranda   Number of children: 1   Years of education: Not on file   Highest education level: Bachelor's degree (e.g., BA, AB, BS)  Occupational History    Comment: Merchandiser, retail  Tobacco Use   Smoking status: Never   Smokeless tobacco: Never  Vaping Use   Vaping status: Never Used  Substance and Sexual Activity   Alcohol use: Yes    Alcohol/week: 10.0 standard drinks of alcohol    Types: 10 Shots of liquor per week    Comment: social   Drug use: No   Sexual activity: Yes  Other Topics Concern   Not on file  Social History Narrative   Lives with wife   Caffeine- very little   Social Determinants of Health   Financial Resource Strain: Not on file  Food Insecurity: No Food Insecurity (04/27/2023)   Hunger Vital Sign    Worried About Running Out of Food in the Last Year: Never true    Ran Out of Food in the Last Year: Never true  Transportation Needs: No Transportation Needs (04/27/2023)   PRAPARE - Administrator, Civil Service (Medical): No    Lack of Transportation (Non-Medical): No  Physical Activity: Not on file  Stress: Not on file  Social Connections: Not on file  Intimate Partner Violence: Not At Risk (04/27/2023)    Humiliation, Afraid, Rape, and Kick questionnaire    Fear of Current or Ex-Partner: No    Emotionally Abused: No    Physically Abused: No    Sexually Abused: No   ROS-see HPI   + = positive Constitutional:    weight loss, night sweats, fevers, chills, fatigue, lassitude. HEENT:    headaches, difficulty swallowing, tooth/dental problems, sore throat, sneezing, itching, ear ache, nasal congestion, post nasal drip, snoring CV:  chest pain, orthopnea, PND, swelling in lower extremities, anasarca,  dizziness, palpitations Resp:   shortness of breath with exertion or at rest.                productive cough,   non-productive cough, coughing up of blood.              change in color of mucus.  wheezing.   Skin:    rash or lesions. GI:  No-   heartburn, indigestion, abdominal pain, nausea, vomiting, diarrhea,                 change in bowel habits, loss of appetite GU: dysuria, change in color of urine, no urgency or frequency.   flank pain. MS:   joint pain, stiffness, decreased range of motion, back pain. Neuro-     nothing unusual Psych:  change in mood or affect.  depression or anxiety.   memory loss.   OBJ- Physical Exam General- Alert, Oriented, Affect-appropriate, Distress- none acute, +not obese Skin- rash-none, lesions- none, excoriation- none Lymphadenopathy- none Head- atraumatic            Eyes- Gross vision intact, PERRLA, conjunctivae and secretions clear            Ears- Hearing, canals-normal            Nose- Clear, no-Septal dev, mucus, polyps, erosion, perforation             Throat- Mallampati II/ thin posterior , mucosa clear , drainage- none, tonsils- atrophic, +teeth, Neck- flexible , trachea midline, no stridor , thyroid nl, carotid no bruit Chest - symmetrical excursion , unlabored           Heart/CV- RRR , no murmur , no gallop  , no rub, nl s1 s2                           - JVD- none , edema- none, stasis changes- none, varices- none           Lung- clear to  P&A, wheeze- none, cough- none , dullness-none, rub- none           Chest wall-  Abd-  Br/ Gen/ Rectal- Not done, not indicated Extrem- cyanosis- none, clubbing, none, atrophy- none, strength- nl Neuro- grossly intact to observation   .

## 2023-06-26 NOTE — Patient Instructions (Signed)
Order- new DME, new CPAP auto 5-20, mask of choice, humidifier, supplies, AirView/ card

## 2023-06-27 ENCOUNTER — Other Ambulatory Visit (HOSPITAL_BASED_OUTPATIENT_CLINIC_OR_DEPARTMENT_OTHER): Payer: Self-pay

## 2023-06-27 ENCOUNTER — Other Ambulatory Visit: Payer: Self-pay

## 2023-06-27 MED ORDER — OMEGA-3-ACID ETHYL ESTERS 1 G PO CAPS
2.0000 g | ORAL_CAPSULE | Freq: Two times a day (BID) | ORAL | 1 refills | Status: DC
  Filled 2023-06-27: qty 360, 90d supply, fill #0
  Filled 2023-09-14: qty 360, 90d supply, fill #1

## 2023-06-27 MED ORDER — ALPRAZOLAM 0.5 MG PO TABS
0.5000 mg | ORAL_TABLET | Freq: Two times a day (BID) | ORAL | 2 refills | Status: AC
  Filled 2023-06-27: qty 40, 20d supply, fill #0
  Filled 2023-09-14: qty 40, 20d supply, fill #1

## 2023-06-27 MED ORDER — OXYCODONE-ACETAMINOPHEN 7.5-325 MG PO TABS
1.0000 | ORAL_TABLET | Freq: Two times a day (BID) | ORAL | 0 refills | Status: AC
  Filled 2023-06-27: qty 60, 30d supply, fill #0

## 2023-06-27 MED ORDER — LEVOCETIRIZINE DIHYDROCHLORIDE 5 MG PO TABS
5.0000 mg | ORAL_TABLET | Freq: Every evening | ORAL | 1 refills | Status: AC
  Filled 2023-06-27: qty 90, 90d supply, fill #0
  Filled 2023-09-14: qty 90, 90d supply, fill #1

## 2023-06-27 MED ORDER — ESOMEPRAZOLE MAGNESIUM 40 MG PO CPDR
40.0000 mg | DELAYED_RELEASE_CAPSULE | Freq: Every day | ORAL | 1 refills | Status: AC
  Filled 2023-06-27 – 2023-09-14 (×2): qty 90, 90d supply, fill #0

## 2023-06-27 MED ORDER — FENOFIBRATE 160 MG PO TABS
160.0000 mg | ORAL_TABLET | Freq: Every day | ORAL | 1 refills | Status: AC
  Filled 2023-06-27 – 2023-09-14 (×2): qty 90, 90d supply, fill #0

## 2023-06-27 MED ORDER — AMPHETAMINE-DEXTROAMPHETAMINE 20 MG PO TABS
20.0000 mg | ORAL_TABLET | Freq: Three times a day (TID) | ORAL | 0 refills | Status: AC
  Filled 2023-06-27 – 2023-07-27 (×2): qty 90, 30d supply, fill #0

## 2023-06-28 ENCOUNTER — Other Ambulatory Visit (HOSPITAL_BASED_OUTPATIENT_CLINIC_OR_DEPARTMENT_OTHER): Payer: Self-pay

## 2023-06-28 ENCOUNTER — Encounter: Payer: Self-pay | Admitting: Physician Assistant

## 2023-06-28 ENCOUNTER — Encounter: Payer: Self-pay | Admitting: Internal Medicine

## 2023-06-28 ENCOUNTER — Ambulatory Visit: Payer: 59 | Admitting: Physician Assistant

## 2023-06-28 DIAGNOSIS — Z96642 Presence of left artificial hip joint: Secondary | ICD-10-CM

## 2023-06-28 DIAGNOSIS — G4733 Obstructive sleep apnea (adult) (pediatric): Secondary | ICD-10-CM | POA: Insufficient documentation

## 2023-06-28 NOTE — Assessment & Plan Note (Signed)
Appropriate discussion, reviewed physiology, medical concerns safe driving, treatment options. Questions addressed. He is not sure if he will be comfortable with CPAP- father was on ventilator. Plan- CPAP

## 2023-06-28 NOTE — Assessment & Plan Note (Signed)
History noted. Hopefully introduction of CPAP can be smooth enough to avoid triggering intolerance.

## 2023-06-28 NOTE — Progress Notes (Signed)
HPI: Connor Martin returns today 9 weeks status post left total hip arthroplasty.  He states that he continues to have popping in the hip but it is becoming less frequent.  He describes no pain in the hip but is some stiffness and achiness particularly in the mornings or after being seated for a long period of time.  Review of systems: See HPI otherwise negative or noncontributory.  Physical exam: General Well-developed well-nourished male in no acute distress ambulates without any assistive device. Bilateral hips: Good range of motion of both hips without pain.  Impression: Status post left total hip arthroplasty 04-27-23 Status post right total hip 12-29-22  Plan: He will return to activities as tolerated.  Follow-up with [place] in 3 months sooner if there is any questions concerns.  And return in 3 months with a obtain an AP pelvis and lateral view of both hips.  Questions were encouraged and answered by Dr. Magnus Martin and myself.

## 2023-07-02 ENCOUNTER — Other Ambulatory Visit (HOSPITAL_BASED_OUTPATIENT_CLINIC_OR_DEPARTMENT_OTHER): Payer: Self-pay

## 2023-07-02 MED ORDER — AMPHETAMINE-DEXTROAMPHETAMINE 20 MG PO TABS
20.0000 mg | ORAL_TABLET | Freq: Three times a day (TID) | ORAL | 0 refills | Status: DC
  Filled 2023-10-22: qty 90, 30d supply, fill #0

## 2023-07-02 MED ORDER — OXYCODONE-ACETAMINOPHEN 7.5-325 MG PO TABS
1.0000 | ORAL_TABLET | Freq: Two times a day (BID) | ORAL | 0 refills | Status: DC | PRN
  Filled 2023-07-02 – 2023-09-14 (×2): qty 60, 30d supply, fill #0

## 2023-07-02 MED ORDER — OXYCODONE-ACETAMINOPHEN 7.5-325 MG PO TABS
1.0000 | ORAL_TABLET | Freq: Two times a day (BID) | ORAL | 0 refills | Status: AC | PRN
  Filled 2023-07-27: qty 60, 30d supply, fill #0

## 2023-07-02 MED ORDER — AMPHETAMINE-DEXTROAMPHETAMINE 20 MG PO TABS
20.0000 mg | ORAL_TABLET | Freq: Three times a day (TID) | ORAL | 0 refills | Status: AC
  Filled 2023-09-14: qty 90, 30d supply, fill #0

## 2023-07-03 ENCOUNTER — Encounter: Payer: Self-pay | Admitting: Orthopaedic Surgery

## 2023-07-23 ENCOUNTER — Ambulatory Visit (INDEPENDENT_AMBULATORY_CARE_PROVIDER_SITE_OTHER): Payer: 59 | Admitting: Orthopedic Surgery

## 2023-07-23 ENCOUNTER — Other Ambulatory Visit (INDEPENDENT_AMBULATORY_CARE_PROVIDER_SITE_OTHER): Payer: 59

## 2023-07-23 DIAGNOSIS — M25531 Pain in right wrist: Secondary | ICD-10-CM

## 2023-07-23 DIAGNOSIS — M25532 Pain in left wrist: Secondary | ICD-10-CM

## 2023-07-23 NOTE — Progress Notes (Signed)
Connor Martin - 47 y.o. male MRN [id number]  Date of birth: Apr 17, 1976  Office Visit Note: Visit Date: 07/23/2023 PCP: Kerry Fort, MD Referred by: Kerry Fort, MD  Subjective: No chief complaint on file.  HPI: Connor Martin is a pleasant 47 y.o. male who presents today for follow-up of ongoing left ulnar-sided wrist pain as well as numbness and tingling in the ulnar sided digits over the past multiple years.  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 Magnus Ivan over this past year.  He states that the pain can be activity related, particularly with loading activities or impact to the left wrist.  The numbness and tingling is intermittent, on today's examination, it is significantly improved.  He has had some progressive pain in the right wrist which is newer.  No significant injury.   Pertinent ROS were reviewed with the patient and found to be negative unless otherwise specified above in HPI.   Assessment & Plan: Visit Diagnoses:  1. Pain in right wrist     Plan: Extensive discussion was had the patient today regarding his bilateral wrist and hand complaints.  As discussed previously, I had reviewed the results of his prior workup including his prior x-ray and MRI study.  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  However, the symptoms have improved significantly with conservative measures.  We once again discussed treatment options ranging from conservative to surgical.  From a conservative standpoint, he could continue with bracing and activity modification as needed.  From a surgical standpoint, he would be indicated for left wrist ganglion cyst excision as well as Guyon's canal decompression should his symptoms recur.  As for the right wrist, x-rays were reviewed today which do not show any significant pathology.   Activities as tolerated.    Follow-up: No follow-ups on file.   Meds & Orders: No orders of the defined types were placed in this encounter.   Orders Placed This Encounter  Procedures   XR Wrist Complete Right     Procedures: No procedures performed      Clinical History: No specialty comments available.  He reports that he has never smoked. He has never used smokeless tobacco.  Recent Labs    04/28/23 0413  HGBA1C 6.0*    Objective:   Vital Signs: There were no vitals taken for this visit.  Physical Exam  Gen: Well-appearing, in no acute distress; non-toxic CV: Regular Rate. Well-perfused. Warm.  Resp: Breathing unlabored on room air; no wheezing. Psych: Fluid speech in conversation; appropriate affect; normal thought process Neuro: Sensation intact throughout. No gross coordination deficits.   Ortho Exam PHYSICAL EXAM:  General: Patient is well appearing and in no distress. Cervical spine mobility is full in all directions:  Skin and Muscle: No skin changes are apparent to upper extremities.  Muscle bulk and contour normal, no signs of atrophy.     Range of Motion and Palpation Tests: Mobility is full about the elbows with flexion and extension.  Forearm supination and pronation are 85/85 bilaterally.  Wrist flexion/extension is 75/65 bilaterally.  Digital flexion and extension are full.  Thumb opposition is full to the base of the small fingers bilaterally.    No cords or nodules are palpated.  No triggering is observed.    No tenderness over the thumb CMC  articulations is observed.  Scaphoid shift test is negative.  Finklestein test is negative.  Ulnar impingement test is negative bilaterally.  No evidence of radiocarpal, midcarpal or intercarpal joint instability with provocation.  Neurologic, Vascular, Motor: Sensation is intact to light touch in all distributions bilateral  Phalen's negative, Derkan's compression negative Fingers pink and well perfused.   Capillary refill is brisk.      Lab Results  Component Value Date   HGBA1C 6.0 (H) 04/28/2023     Imaging: XR Wrist Complete Right  Result Date: 07/23/2023 X-rays of the right wrist demonstrate well aligned radiocarpal and midcarpal articulations without significant bony abnormalities.  Bone mineralization is appropriate.   Past Medical/Family/Surgical/Social History: Medications & Allergies reviewed per EMR, new medications updated. 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   Avascular necrosis of bone of left hip (HCC) 10/11/2022   Dyslipidemia 02/25/2020   ADHD, 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   Adjustment disorder with depressed mood 02/25/2020   Nutritional anemia 02/25/2020   Fatty liver 09/02/2016   History of panic attacks 08/30/2016   GAD (generalized anxiety disorder) 08/30/2016   Gastroesophageal reflux disease without esophagitis    High blood triglycerides    Pancreatitis 04/07/2016   Abnormal LFTs 04/06/2016   Alcohol use 04/06/2016   Cough 04/06/2016   Pancreatitis, acute 04/06/2016   Panic attacks    GERD (gastroesophageal reflux disease)    Past Medical History:  Diagnosis Date   Acute pancreatitis hospitalized 04/06/2016   Acute sinusitis    ADD (attention deficit disorder)    Anxiety    Arthritis    Avascular necrosis of bone of hip (HCC)    Blood in stool    Bronchitis    Chronic lower back pain    Depression    Dyslipidemia    Family history of colon cancer    Fatty liver    GERD (gastroesophageal reflux disease)    Headache    "@ least once/week" (04/07/2016)   Hypertriglyceridemia    Hypertriglyceridemia    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    Migraine    "q other month" (04/07/2016)   Nasal congestion    Palpitations    Pancreatitis    Panic attacks    Pneumonia ~ 1981    Sleep apnea    Snoring    Family History  Problem Relation Age of Onset   Hypothyroidism Mother    Heart disease Mother    Diabetes Mother    ALS Father    Past Surgical History:  Procedure Laterality Date   TONSILLECTOMY AND ADENOIDECTOMY  ~ 1990   TOTAL HIP ARTHROPLASTY Right 12/29/2022   Procedure: RIGHT TOTAL HIP ARTHROPLASTY ANTERIOR APPROACH;  Surgeon: Kathryne Hitch, MD;  Location: [facility];  Service: Orthopedics;  Laterality: Right;   TOTAL HIP ARTHROPLASTY Left 04/27/2023   Procedure: LEFT TOTAL HIP ARTHROPLASTY ANTERIOR APPROACH;  Surgeon: Kathryne Hitch, MD;  Location: [facility];  Service: Orthopedics;  Laterality: Left;   Social History   Occupational History    Comment: Merchandiser, retail  Tobacco Use   Smoking status: Never   Smokeless tobacco: Never  Vaping Use   Vaping status: Never Used  Substance and Sexual Activity   Alcohol use: Yes    Alcohol/week: 10.0 standard drinks of alcohol    Types: 10 Shots of  liquor per week    Comment: social   Drug use: No   Sexual activity: Yes    Lakeria Starkman Fara Boros) Gwynneth Fabio, M.D. [facility] 10:26 PM Follow up for left wrist  New injury/pain with right wrist Same type feeling Would like xray today for reassurance

## 2023-07-25 ENCOUNTER — Ambulatory Visit: Payer: 59 | Admitting: Orthopaedic Surgery

## 2023-07-26 ENCOUNTER — Ambulatory Visit: Payer: 59 | Admitting: Orthopaedic Surgery

## 2023-07-26 ENCOUNTER — Other Ambulatory Visit (INDEPENDENT_AMBULATORY_CARE_PROVIDER_SITE_OTHER): Payer: 59

## 2023-07-26 ENCOUNTER — Encounter: Payer: Self-pay | Admitting: Orthopaedic Surgery

## 2023-07-26 DIAGNOSIS — G4733 Obstructive sleep apnea (adult) (pediatric): Secondary | ICD-10-CM | POA: Diagnosis not present

## 2023-07-26 DIAGNOSIS — M25562 Pain in left knee: Secondary | ICD-10-CM | POA: Diagnosis not present

## 2023-07-26 DIAGNOSIS — G8929 Other chronic pain: Secondary | ICD-10-CM | POA: Diagnosis not present

## 2023-07-26 DIAGNOSIS — M25561 Pain in right knee: Secondary | ICD-10-CM | POA: Diagnosis not present

## 2023-07-26 NOTE — Progress Notes (Signed)
The patient is a 47 year old active gentleman that is very well-known to me.  We have replaced both of his hips this year.  He has been dealing with right knee pain with locking and catching even before we replaced his hips.  This is been slowly getting worse and is the medial joint line that causes him most of the pain is where this is located.  Pivoting activities with getting in and out of cars and doing other activities because of the lateral locking catching and continued pain with the right knee.  He has had some left knee pain but he feels like this may be just compensating for dealing with his right knee.  He does have a problem getting down on both his knees.  His hip replacements have done well.  Examination of his right knee does show a positive McMurray's exam to the medial compartment of the his right knee with significant medial joint line tenderness.  He has pain throughout the arc of motion of that knee with some patellofemoral crepitation.  Left knee only shows some patellofemoral pain but excellent range of motion with a negative McMurray's and negative Lachman's exam.  The right knee also is ligamentously stable except for concern of having a medial meniscal tear.  Both knees were x-rayed today and are unremarkable in terms of malalignment or joint space narrowing.  There is slight patellofemoral narrowing on both knees.  At this point we do need to obtain an MRI of his right knee to rule out a meniscal tear given his clinical exam findings.  I would not recommend steroid injections in his knees given the history of avascular necrosis and having hip replacement surgeries.  We do not want to cause any infection risk at all.  At this point a MRI is certainly medically necessary as well for his right knee.  We will see about getting this ordered and see him back in follow-up once we have the MRI of his right knee to assess for meniscal tear.

## 2023-07-27 ENCOUNTER — Other Ambulatory Visit: Payer: Self-pay

## 2023-07-27 ENCOUNTER — Other Ambulatory Visit (HOSPITAL_BASED_OUTPATIENT_CLINIC_OR_DEPARTMENT_OTHER): Payer: Self-pay

## 2023-07-27 DIAGNOSIS — G8929 Other chronic pain: Secondary | ICD-10-CM

## 2023-07-30 ENCOUNTER — Other Ambulatory Visit: Payer: Self-pay

## 2023-08-01 ENCOUNTER — Other Ambulatory Visit: Payer: 59

## 2023-08-01 ENCOUNTER — Other Ambulatory Visit (HOSPITAL_BASED_OUTPATIENT_CLINIC_OR_DEPARTMENT_OTHER): Payer: Self-pay

## 2023-08-02 ENCOUNTER — Other Ambulatory Visit (HOSPITAL_BASED_OUTPATIENT_CLINIC_OR_DEPARTMENT_OTHER): Payer: Self-pay

## 2023-08-03 ENCOUNTER — Other Ambulatory Visit (HOSPITAL_BASED_OUTPATIENT_CLINIC_OR_DEPARTMENT_OTHER): Payer: Self-pay

## 2023-08-06 ENCOUNTER — Other Ambulatory Visit (HOSPITAL_BASED_OUTPATIENT_CLINIC_OR_DEPARTMENT_OTHER): Payer: Self-pay

## 2023-08-17 ENCOUNTER — Ambulatory Visit
Admission: RE | Admit: 2023-08-17 | Discharge: 2023-08-17 | Disposition: A | Discharge status: Home / Self Care | Payer: 59 | Source: Ambulatory Visit | Attending: Orthopaedic Surgery | Admitting: Orthopaedic Surgery

## 2023-08-17 DIAGNOSIS — M1711 Unilateral primary osteoarthritis, right knee: Secondary | ICD-10-CM | POA: Diagnosis not present

## 2023-08-17 DIAGNOSIS — M25561 Pain in right knee: Secondary | ICD-10-CM | POA: Diagnosis not present

## 2023-08-17 DIAGNOSIS — G8929 Other chronic pain: Secondary | ICD-10-CM

## 2023-08-22 ENCOUNTER — Encounter: Payer: Self-pay | Admitting: Orthopaedic Surgery

## 2023-08-22 ENCOUNTER — Other Ambulatory Visit: Payer: Self-pay

## 2023-08-22 DIAGNOSIS — M79604 Pain in right leg: Secondary | ICD-10-CM

## 2023-08-25 DIAGNOSIS — G4733 Obstructive sleep apnea (adult) (pediatric): Secondary | ICD-10-CM | POA: Diagnosis not present

## 2023-08-30 ENCOUNTER — Telehealth: Payer: Self-pay

## 2023-08-30 ENCOUNTER — Ambulatory Visit (HOSPITAL_COMMUNITY)
Admission: RE | Admit: 2023-08-30 | Discharge: 2023-08-30 | Disposition: A | Discharge status: Home / Self Care | Payer: 59 | Source: Ambulatory Visit | Attending: Orthopaedic Surgery | Admitting: Orthopaedic Surgery

## 2023-08-30 DIAGNOSIS — M79604 Pain in right leg: Secondary | ICD-10-CM | POA: Diagnosis not present

## 2023-08-30 DIAGNOSIS — M79605 Pain in left leg: Secondary | ICD-10-CM | POA: Insufficient documentation

## 2023-08-30 NOTE — Telephone Encounter (Signed)
Patient doppler was NEGATIVE

## 2023-09-14 ENCOUNTER — Other Ambulatory Visit: Payer: Self-pay

## 2023-09-14 ENCOUNTER — Other Ambulatory Visit (HOSPITAL_BASED_OUTPATIENT_CLINIC_OR_DEPARTMENT_OTHER): Payer: Self-pay

## 2023-09-15 ENCOUNTER — Other Ambulatory Visit (HOSPITAL_BASED_OUTPATIENT_CLINIC_OR_DEPARTMENT_OTHER): Payer: Self-pay

## 2023-09-17 ENCOUNTER — Other Ambulatory Visit (HOSPITAL_BASED_OUTPATIENT_CLINIC_OR_DEPARTMENT_OTHER): Payer: Self-pay

## 2023-09-17 ENCOUNTER — Other Ambulatory Visit: Payer: Self-pay

## 2023-09-17 MED ORDER — IMIQUIMOD 5 % EX CREA
1.0000 | TOPICAL_CREAM | CUTANEOUS | 0 refills | Status: AC
  Filled 2023-09-17: qty 24, 24d supply, fill #0

## 2023-09-25 DIAGNOSIS — G4733 Obstructive sleep apnea (adult) (pediatric): Secondary | ICD-10-CM | POA: Diagnosis not present

## 2023-10-01 ENCOUNTER — Encounter: Payer: Self-pay | Admitting: Orthopaedic Surgery

## 2023-10-01 ENCOUNTER — Ambulatory Visit: Payer: 59 | Admitting: Orthopaedic Surgery

## 2023-10-01 ENCOUNTER — Other Ambulatory Visit (INDEPENDENT_AMBULATORY_CARE_PROVIDER_SITE_OTHER): Payer: 59

## 2023-10-01 DIAGNOSIS — Z96642 Presence of left artificial hip joint: Secondary | ICD-10-CM

## 2023-10-01 NOTE — Progress Notes (Signed)
 The patient is well-known to me.  We replaced his right hip in April of this past year and his left hip in August of this past year secondary to avascular necrosis.  He is only 48 years old and very active.  He still gets occasional right knee pain and sometimes a is worse in the morning when he first gets up.  We did MRI his right knee recently and showed no internal derangement in terms of meniscal tear.  He says the hips are doing well.  He has also had Dopplers of his legs which were negative for DVT.  He is walking without assistive device.  Both hips move smoothly and fluidly.  An AP pelvis shows well-seated bilateral total hip arthroplasties that are bone ingrown with no complicating features.  At this point follow-up for his hips can be as needed.  If his right knee starts giving him problems again I would like him to see my partner Dr. Burnetta for his assessment of that right knee.  He has seen Dr. Erwin for his wrist as well.  If he does develop any issues with his hips he knows to let us  know as well.

## 2023-10-15 ENCOUNTER — Other Ambulatory Visit (HOSPITAL_BASED_OUTPATIENT_CLINIC_OR_DEPARTMENT_OTHER): Payer: Self-pay

## 2023-10-15 ENCOUNTER — Encounter (HOSPITAL_BASED_OUTPATIENT_CLINIC_OR_DEPARTMENT_OTHER): Payer: Self-pay

## 2023-10-16 ENCOUNTER — Other Ambulatory Visit: Payer: Self-pay

## 2023-10-16 ENCOUNTER — Other Ambulatory Visit (HOSPITAL_BASED_OUTPATIENT_CLINIC_OR_DEPARTMENT_OTHER): Payer: Self-pay

## 2023-10-22 ENCOUNTER — Other Ambulatory Visit (HOSPITAL_BASED_OUTPATIENT_CLINIC_OR_DEPARTMENT_OTHER): Payer: Self-pay

## 2023-10-23 ENCOUNTER — Other Ambulatory Visit (HOSPITAL_BASED_OUTPATIENT_CLINIC_OR_DEPARTMENT_OTHER): Payer: Self-pay

## 2023-10-23 ENCOUNTER — Other Ambulatory Visit: Payer: Self-pay

## 2023-10-26 DIAGNOSIS — G4733 Obstructive sleep apnea (adult) (pediatric): Secondary | ICD-10-CM | POA: Diagnosis not present

## 2023-11-01 ENCOUNTER — Other Ambulatory Visit (HOSPITAL_BASED_OUTPATIENT_CLINIC_OR_DEPARTMENT_OTHER): Payer: Self-pay

## 2023-11-23 DIAGNOSIS — G4733 Obstructive sleep apnea (adult) (pediatric): Secondary | ICD-10-CM | POA: Diagnosis not present

## 2023-11-29 ENCOUNTER — Other Ambulatory Visit (HOSPITAL_BASED_OUTPATIENT_CLINIC_OR_DEPARTMENT_OTHER): Payer: Self-pay

## 2023-11-29 MED ORDER — FENOFIBRATE 160 MG PO TABS
160.0000 mg | ORAL_TABLET | Freq: Every day | ORAL | 0 refills | Status: AC
  Filled 2023-11-29: qty 90, 90d supply, fill #0

## 2023-11-29 MED ORDER — ESOMEPRAZOLE MAGNESIUM 40 MG PO CPDR
40.0000 mg | DELAYED_RELEASE_CAPSULE | Freq: Every day | ORAL | 0 refills | Status: AC
  Filled 2023-11-29: qty 90, 90d supply, fill #0

## 2023-11-29 MED ORDER — OMEGA-3-ACID ETHYL ESTERS 1 G PO CAPS
2.0000 | ORAL_CAPSULE | Freq: Two times a day (BID) | ORAL | 0 refills | Status: AC
  Filled 2023-11-29: qty 360, 90d supply, fill #0

## 2023-11-30 ENCOUNTER — Other Ambulatory Visit (HOSPITAL_BASED_OUTPATIENT_CLINIC_OR_DEPARTMENT_OTHER): Payer: Self-pay

## 2023-12-03 ENCOUNTER — Other Ambulatory Visit (HOSPITAL_BASED_OUTPATIENT_CLINIC_OR_DEPARTMENT_OTHER): Payer: Self-pay

## 2023-12-03 ENCOUNTER — Other Ambulatory Visit: Payer: Self-pay

## 2023-12-03 MED ORDER — ALPRAZOLAM 0.5 MG PO TABS
0.5000 mg | ORAL_TABLET | Freq: Two times a day (BID) | ORAL | 2 refills | Status: DC | PRN
  Filled 2023-12-03: qty 40, 20d supply, fill #0
  Filled 2024-04-04 (×2): qty 40, 20d supply, fill #1
  Filled 2024-05-09: qty 40, 20d supply, fill #2

## 2023-12-03 MED ORDER — OXYCODONE-ACETAMINOPHEN 7.5-325 MG PO TABS
1.0000 | ORAL_TABLET | Freq: Two times a day (BID) | ORAL | 0 refills | Status: DC | PRN
  Filled 2023-12-03: qty 60, 30d supply, fill #0

## 2023-12-03 MED ORDER — AMPHETAMINE-DEXTROAMPHETAMINE 20 MG PO TABS
20.0000 mg | ORAL_TABLET | Freq: Three times a day (TID) | ORAL | 0 refills | Status: DC
  Filled 2023-12-03: qty 90, 30d supply, fill #0

## 2023-12-04 ENCOUNTER — Other Ambulatory Visit (HOSPITAL_BASED_OUTPATIENT_CLINIC_OR_DEPARTMENT_OTHER): Payer: Self-pay

## 2023-12-05 ENCOUNTER — Other Ambulatory Visit (HOSPITAL_BASED_OUTPATIENT_CLINIC_OR_DEPARTMENT_OTHER): Payer: Self-pay

## 2023-12-06 ENCOUNTER — Other Ambulatory Visit (HOSPITAL_BASED_OUTPATIENT_CLINIC_OR_DEPARTMENT_OTHER): Payer: Self-pay

## 2023-12-10 ENCOUNTER — Other Ambulatory Visit (HOSPITAL_BASED_OUTPATIENT_CLINIC_OR_DEPARTMENT_OTHER): Payer: Self-pay

## 2023-12-14 ENCOUNTER — Other Ambulatory Visit (HOSPITAL_BASED_OUTPATIENT_CLINIC_OR_DEPARTMENT_OTHER): Payer: Self-pay

## 2023-12-14 ENCOUNTER — Encounter (HOSPITAL_BASED_OUTPATIENT_CLINIC_OR_DEPARTMENT_OTHER): Payer: Self-pay

## 2023-12-17 ENCOUNTER — Other Ambulatory Visit (HOSPITAL_COMMUNITY): Payer: Self-pay

## 2023-12-17 ENCOUNTER — Other Ambulatory Visit (HOSPITAL_BASED_OUTPATIENT_CLINIC_OR_DEPARTMENT_OTHER): Payer: Self-pay

## 2023-12-24 DIAGNOSIS — G4733 Obstructive sleep apnea (adult) (pediatric): Secondary | ICD-10-CM | POA: Diagnosis not present

## 2024-01-09 ENCOUNTER — Other Ambulatory Visit (HOSPITAL_BASED_OUTPATIENT_CLINIC_OR_DEPARTMENT_OTHER): Payer: Self-pay

## 2024-01-09 DIAGNOSIS — K760 Fatty (change of) liver, not elsewhere classified: Secondary | ICD-10-CM | POA: Diagnosis not present

## 2024-01-09 DIAGNOSIS — E781 Pure hyperglyceridemia: Secondary | ICD-10-CM | POA: Diagnosis not present

## 2024-01-09 DIAGNOSIS — K219 Gastro-esophageal reflux disease without esophagitis: Secondary | ICD-10-CM | POA: Diagnosis not present

## 2024-01-09 DIAGNOSIS — E039 Hypothyroidism, unspecified: Secondary | ICD-10-CM | POA: Diagnosis not present

## 2024-01-09 DIAGNOSIS — F4321 Adjustment disorder with depressed mood: Secondary | ICD-10-CM | POA: Diagnosis not present

## 2024-01-09 DIAGNOSIS — I10 Essential (primary) hypertension: Secondary | ICD-10-CM | POA: Diagnosis not present

## 2024-01-09 DIAGNOSIS — M8700 Idiopathic aseptic necrosis of unspecified bone: Secondary | ICD-10-CM | POA: Diagnosis not present

## 2024-01-09 DIAGNOSIS — K861 Other chronic pancreatitis: Secondary | ICD-10-CM | POA: Diagnosis not present

## 2024-01-09 DIAGNOSIS — M879 Osteonecrosis, unspecified: Secondary | ICD-10-CM | POA: Diagnosis not present

## 2024-01-09 DIAGNOSIS — L409 Psoriasis, unspecified: Secondary | ICD-10-CM | POA: Diagnosis not present

## 2024-01-09 MED ORDER — OMEGA-3-ACID ETHYL ESTERS 1 G PO CAPS
2.0000 g | ORAL_CAPSULE | Freq: Two times a day (BID) | ORAL | 0 refills | Status: AC
  Filled 2024-01-09: qty 360, 90d supply, fill #0

## 2024-01-09 MED ORDER — FENOFIBRATE 160 MG PO TABS
160.0000 mg | ORAL_TABLET | Freq: Every day | ORAL | 0 refills | Status: AC
  Filled 2024-01-09 – 2024-01-22 (×2): qty 90, 90d supply, fill #0

## 2024-01-09 MED ORDER — ESOMEPRAZOLE MAGNESIUM 40 MG PO CPDR
40.0000 mg | DELAYED_RELEASE_CAPSULE | Freq: Every day | ORAL | 0 refills | Status: AC
  Filled 2024-01-09 – 2024-01-22 (×2): qty 90, 90d supply, fill #0

## 2024-01-09 MED ORDER — AMPHETAMINE-DEXTROAMPHETAMINE 20 MG PO TABS
20.0000 mg | ORAL_TABLET | Freq: Three times a day (TID) | ORAL | 0 refills | Status: AC
Start: 2024-01-09 — End: ?
  Filled 2024-04-04: qty 44, 15d supply, fill #0
  Filled 2024-05-28: qty 46, 16d supply, fill #1

## 2024-01-09 MED ORDER — OXYCODONE-ACETAMINOPHEN 7.5-325 MG PO TABS
1.0000 | ORAL_TABLET | Freq: Two times a day (BID) | ORAL | 0 refills | Status: DC | PRN
  Filled 2024-01-09 – 2024-01-22 (×2): qty 60, 30d supply, fill #0

## 2024-01-09 MED ORDER — OXYCODONE-ACETAMINOPHEN 7.5-325 MG PO TABS
1.0000 | ORAL_TABLET | Freq: Two times a day (BID) | ORAL | 0 refills | Status: AC | PRN
  Filled 2024-02-21: qty 60, 30d supply, fill #0

## 2024-01-09 MED ORDER — AMPHETAMINE-DEXTROAMPHETAMINE 20 MG PO TABS
20.0000 mg | ORAL_TABLET | Freq: Three times a day (TID) | ORAL | 0 refills | Status: AC
Start: 2024-01-09 — End: ?
  Filled 2024-01-09 – 2024-06-17 (×2): qty 90, 30d supply, fill #0

## 2024-01-09 MED ORDER — OXYCODONE-ACETAMINOPHEN 7.5-325 MG PO TABS
1.0000 | ORAL_TABLET | Freq: Two times a day (BID) | ORAL | 0 refills | Status: DC | PRN
  Filled 2024-05-09: qty 60, 30d supply, fill #0

## 2024-01-09 MED ORDER — AMPHETAMINE-DEXTROAMPHETAMINE 20 MG PO TABS
20.0000 mg | ORAL_TABLET | Freq: Three times a day (TID) | ORAL | 0 refills | Status: AC
  Filled 2024-01-09: qty 90, 30d supply, fill #0

## 2024-01-10 ENCOUNTER — Other Ambulatory Visit (HOSPITAL_BASED_OUTPATIENT_CLINIC_OR_DEPARTMENT_OTHER): Payer: Self-pay

## 2024-01-10 ENCOUNTER — Other Ambulatory Visit: Payer: Self-pay

## 2024-01-14 ENCOUNTER — Other Ambulatory Visit (HOSPITAL_BASED_OUTPATIENT_CLINIC_OR_DEPARTMENT_OTHER): Payer: Self-pay

## 2024-01-16 ENCOUNTER — Other Ambulatory Visit (HOSPITAL_BASED_OUTPATIENT_CLINIC_OR_DEPARTMENT_OTHER): Payer: Self-pay

## 2024-01-21 ENCOUNTER — Other Ambulatory Visit (HOSPITAL_BASED_OUTPATIENT_CLINIC_OR_DEPARTMENT_OTHER): Payer: Self-pay

## 2024-01-22 ENCOUNTER — Other Ambulatory Visit (HOSPITAL_BASED_OUTPATIENT_CLINIC_OR_DEPARTMENT_OTHER): Payer: Self-pay

## 2024-01-23 ENCOUNTER — Other Ambulatory Visit (HOSPITAL_BASED_OUTPATIENT_CLINIC_OR_DEPARTMENT_OTHER): Payer: Self-pay

## 2024-01-23 DIAGNOSIS — G4733 Obstructive sleep apnea (adult) (pediatric): Secondary | ICD-10-CM | POA: Diagnosis not present

## 2024-01-30 DIAGNOSIS — R739 Hyperglycemia, unspecified: Secondary | ICD-10-CM | POA: Diagnosis not present

## 2024-01-30 DIAGNOSIS — Z130 Encounter for screening for diseases of the blood and blood-forming organs and certain disorders involving the immune mechanism: Secondary | ICD-10-CM | POA: Diagnosis not present

## 2024-01-30 DIAGNOSIS — Z13228 Encounter for screening for other metabolic disorders: Secondary | ICD-10-CM | POA: Diagnosis not present

## 2024-01-30 DIAGNOSIS — Z125 Encounter for screening for malignant neoplasm of prostate: Secondary | ICD-10-CM | POA: Diagnosis not present

## 2024-01-30 DIAGNOSIS — E039 Hypothyroidism, unspecified: Secondary | ICD-10-CM | POA: Diagnosis not present

## 2024-01-31 ENCOUNTER — Other Ambulatory Visit (HOSPITAL_BASED_OUTPATIENT_CLINIC_OR_DEPARTMENT_OTHER): Payer: Self-pay

## 2024-01-31 ENCOUNTER — Encounter (HOSPITAL_BASED_OUTPATIENT_CLINIC_OR_DEPARTMENT_OTHER): Payer: Self-pay

## 2024-02-01 ENCOUNTER — Other Ambulatory Visit (HOSPITAL_BASED_OUTPATIENT_CLINIC_OR_DEPARTMENT_OTHER): Payer: Self-pay

## 2024-02-04 ENCOUNTER — Other Ambulatory Visit (HOSPITAL_BASED_OUTPATIENT_CLINIC_OR_DEPARTMENT_OTHER): Payer: Self-pay

## 2024-02-08 ENCOUNTER — Other Ambulatory Visit (HOSPITAL_BASED_OUTPATIENT_CLINIC_OR_DEPARTMENT_OTHER): Payer: Self-pay

## 2024-02-12 ENCOUNTER — Other Ambulatory Visit (HOSPITAL_BASED_OUTPATIENT_CLINIC_OR_DEPARTMENT_OTHER): Payer: Self-pay

## 2024-02-15 ENCOUNTER — Other Ambulatory Visit (HOSPITAL_BASED_OUTPATIENT_CLINIC_OR_DEPARTMENT_OTHER): Payer: Self-pay

## 2024-02-15 MED ORDER — VEREGEN 15 % EX OINT
1.0000 | TOPICAL_OINTMENT | Freq: Three times a day (TID) | CUTANEOUS | 3 refills | Status: AC
Start: 2024-02-15 — End: ?
  Filled 2024-02-15 – 2024-02-21 (×3): qty 30, 30d supply, fill #0

## 2024-02-20 ENCOUNTER — Other Ambulatory Visit (HOSPITAL_BASED_OUTPATIENT_CLINIC_OR_DEPARTMENT_OTHER): Payer: Self-pay

## 2024-02-21 ENCOUNTER — Other Ambulatory Visit (HOSPITAL_BASED_OUTPATIENT_CLINIC_OR_DEPARTMENT_OTHER): Payer: Self-pay

## 2024-02-21 ENCOUNTER — Encounter (HOSPITAL_BASED_OUTPATIENT_CLINIC_OR_DEPARTMENT_OTHER): Payer: Self-pay

## 2024-02-23 DIAGNOSIS — G4733 Obstructive sleep apnea (adult) (pediatric): Secondary | ICD-10-CM | POA: Diagnosis not present

## 2024-03-01 DIAGNOSIS — G4733 Obstructive sleep apnea (adult) (pediatric): Secondary | ICD-10-CM | POA: Diagnosis not present

## 2024-03-24 DIAGNOSIS — G4733 Obstructive sleep apnea (adult) (pediatric): Secondary | ICD-10-CM | POA: Diagnosis not present

## 2024-04-04 ENCOUNTER — Other Ambulatory Visit (HOSPITAL_BASED_OUTPATIENT_CLINIC_OR_DEPARTMENT_OTHER): Payer: Self-pay

## 2024-04-08 ENCOUNTER — Encounter (HOSPITAL_BASED_OUTPATIENT_CLINIC_OR_DEPARTMENT_OTHER): Payer: Self-pay

## 2024-04-08 ENCOUNTER — Other Ambulatory Visit (HOSPITAL_BASED_OUTPATIENT_CLINIC_OR_DEPARTMENT_OTHER): Payer: Self-pay

## 2024-04-08 DIAGNOSIS — L400 Psoriasis vulgaris: Secondary | ICD-10-CM | POA: Diagnosis not present

## 2024-04-08 DIAGNOSIS — A630 Anogenital (venereal) warts: Secondary | ICD-10-CM | POA: Diagnosis not present

## 2024-04-08 MED ORDER — VTAMA 1 % EX CREA
TOPICAL_CREAM | CUTANEOUS | 3 refills | Status: AC
  Filled 2024-04-08: qty 60, 30d supply, fill #0

## 2024-04-09 ENCOUNTER — Other Ambulatory Visit (HOSPITAL_BASED_OUTPATIENT_CLINIC_OR_DEPARTMENT_OTHER): Payer: Self-pay

## 2024-04-09 MED ORDER — OXYCODONE-ACETAMINOPHEN 7.5-325 MG PO TABS
1.0000 | ORAL_TABLET | Freq: Two times a day (BID) | ORAL | 0 refills | Status: AC | PRN
  Filled 2024-04-09: qty 60, 30d supply, fill #0

## 2024-04-10 ENCOUNTER — Other Ambulatory Visit (HOSPITAL_BASED_OUTPATIENT_CLINIC_OR_DEPARTMENT_OTHER): Payer: Self-pay

## 2024-04-12 ENCOUNTER — Other Ambulatory Visit (HOSPITAL_BASED_OUTPATIENT_CLINIC_OR_DEPARTMENT_OTHER): Payer: Self-pay

## 2024-04-16 ENCOUNTER — Other Ambulatory Visit (HOSPITAL_BASED_OUTPATIENT_CLINIC_OR_DEPARTMENT_OTHER): Payer: Self-pay

## 2024-04-18 ENCOUNTER — Other Ambulatory Visit (HOSPITAL_BASED_OUTPATIENT_CLINIC_OR_DEPARTMENT_OTHER): Payer: Self-pay

## 2024-04-22 ENCOUNTER — Other Ambulatory Visit (HOSPITAL_BASED_OUTPATIENT_CLINIC_OR_DEPARTMENT_OTHER): Payer: Self-pay

## 2024-04-23 ENCOUNTER — Other Ambulatory Visit (HOSPITAL_BASED_OUTPATIENT_CLINIC_OR_DEPARTMENT_OTHER): Payer: Self-pay

## 2024-04-24 DIAGNOSIS — G4733 Obstructive sleep apnea (adult) (pediatric): Secondary | ICD-10-CM | POA: Diagnosis not present

## 2024-04-30 ENCOUNTER — Other Ambulatory Visit (HOSPITAL_BASED_OUTPATIENT_CLINIC_OR_DEPARTMENT_OTHER): Payer: Self-pay

## 2024-05-02 ENCOUNTER — Other Ambulatory Visit (HOSPITAL_BASED_OUTPATIENT_CLINIC_OR_DEPARTMENT_OTHER): Payer: Self-pay

## 2024-05-06 ENCOUNTER — Other Ambulatory Visit (HOSPITAL_BASED_OUTPATIENT_CLINIC_OR_DEPARTMENT_OTHER): Payer: Self-pay

## 2024-05-09 ENCOUNTER — Other Ambulatory Visit (HOSPITAL_BASED_OUTPATIENT_CLINIC_OR_DEPARTMENT_OTHER): Payer: Self-pay

## 2024-05-10 ENCOUNTER — Other Ambulatory Visit (HOSPITAL_BASED_OUTPATIENT_CLINIC_OR_DEPARTMENT_OTHER): Payer: Self-pay

## 2024-05-12 ENCOUNTER — Encounter: Payer: Self-pay | Admitting: Orthopaedic Surgery

## 2024-05-13 ENCOUNTER — Other Ambulatory Visit (HOSPITAL_BASED_OUTPATIENT_CLINIC_OR_DEPARTMENT_OTHER): Payer: Self-pay

## 2024-05-13 ENCOUNTER — Encounter: Payer: Self-pay | Admitting: Radiology

## 2024-05-13 NOTE — Telephone Encounter (Signed)
 Sent patient new message. I sent incorrect phone number in this message.

## 2024-05-14 ENCOUNTER — Other Ambulatory Visit (HOSPITAL_BASED_OUTPATIENT_CLINIC_OR_DEPARTMENT_OTHER): Payer: Self-pay

## 2024-05-16 ENCOUNTER — Other Ambulatory Visit (HOSPITAL_BASED_OUTPATIENT_CLINIC_OR_DEPARTMENT_OTHER): Payer: Self-pay

## 2024-05-17 ENCOUNTER — Other Ambulatory Visit (HOSPITAL_BASED_OUTPATIENT_CLINIC_OR_DEPARTMENT_OTHER): Payer: Self-pay

## 2024-05-20 ENCOUNTER — Encounter (HOSPITAL_BASED_OUTPATIENT_CLINIC_OR_DEPARTMENT_OTHER): Payer: Self-pay

## 2024-05-20 ENCOUNTER — Other Ambulatory Visit (HOSPITAL_BASED_OUTPATIENT_CLINIC_OR_DEPARTMENT_OTHER): Payer: Self-pay

## 2024-05-21 ENCOUNTER — Other Ambulatory Visit (HOSPITAL_BASED_OUTPATIENT_CLINIC_OR_DEPARTMENT_OTHER): Payer: Self-pay

## 2024-05-22 ENCOUNTER — Other Ambulatory Visit (HOSPITAL_BASED_OUTPATIENT_CLINIC_OR_DEPARTMENT_OTHER): Payer: Self-pay

## 2024-05-28 ENCOUNTER — Other Ambulatory Visit (HOSPITAL_BASED_OUTPATIENT_CLINIC_OR_DEPARTMENT_OTHER): Payer: Self-pay

## 2024-06-10 DIAGNOSIS — Z86018 Personal history of other benign neoplasm: Secondary | ICD-10-CM | POA: Diagnosis not present

## 2024-06-10 DIAGNOSIS — R7989 Other specified abnormal findings of blood chemistry: Secondary | ICD-10-CM | POA: Diagnosis not present

## 2024-06-10 DIAGNOSIS — Z8719 Personal history of other diseases of the digestive system: Secondary | ICD-10-CM | POA: Diagnosis not present

## 2024-06-10 DIAGNOSIS — R7401 Elevation of levels of liver transaminase levels: Secondary | ICD-10-CM | POA: Diagnosis not present

## 2024-06-11 ENCOUNTER — Other Ambulatory Visit (HOSPITAL_BASED_OUTPATIENT_CLINIC_OR_DEPARTMENT_OTHER): Payer: Self-pay

## 2024-06-11 ENCOUNTER — Encounter: Admitting: Orthopedic Surgery

## 2024-06-12 ENCOUNTER — Other Ambulatory Visit (INDEPENDENT_AMBULATORY_CARE_PROVIDER_SITE_OTHER): Payer: Self-pay

## 2024-06-12 ENCOUNTER — Ambulatory Visit (INDEPENDENT_AMBULATORY_CARE_PROVIDER_SITE_OTHER): Admitting: Orthopedic Surgery

## 2024-06-12 ENCOUNTER — Encounter: Payer: Self-pay | Admitting: Orthopedic Surgery

## 2024-06-12 DIAGNOSIS — M25511 Pain in right shoulder: Secondary | ICD-10-CM

## 2024-06-12 NOTE — Progress Notes (Unsigned)
 Office Visit Note   Patient: Connor Martin           Date of Birth: June 22, 1976           MRN: [id number] Visit Date: 06/12/2024 Requested by: Sarita Hutchinson, MD No address on file PCP: Sarita Hutchinson, MD  Subjective: Chief Complaint  Patient presents with   Right Shoulder - Pain    HPI: Connor Martin is a 48 y.o. male who presents to the office reporting right shoulder pain 1 year duration.  Does report right shoulder dislocation in 2023 which was reduced.  He has had symptoms in the shoulder off and on since that time but they have been worsening over the past several months.  He is right-hand dominant.  The pain comes and goes.  Reaching behind him overhead does cause symptoms.  Denies any neck pain radicular symptoms or paresthesias.  He does describe some mild decrease in range of motion secondary to the pain.  Pain does not wake him from sleep at night.  He localizes the pain both anteriorly and posteriorly.  Shoulder has new popping which has been going on for about 2 months.  Does not hurt him all the time.  He does have 37-year-old and he lifts frequently.  Also notably Jaque has had bilateral total hip replacements for AVN.  Played a lot of soccer when he was younger.  He did fall down the steps which caused a lot of his hip and shoulder symptoms several years ago..                ROS: All systems reviewed are negative as they relate to the chief complaint within the history of present illness.  Patient denies fevers or chills.  Assessment & Plan: Visit Diagnoses:  1. Right shoulder pain, unspecified chronicity     Plan: Impression is likely SLAP tear without frank instability symptoms in the right shoulder.  Symptoms ongoing for longer than a year worsening over the past 2 months with failure of conservative treatment.  He has been trying occasional over-the-counter medication without relief.  Also has tried activity modification as well as exercise program without relief.   Plan at this time is MRI arthrogram of the right shoulder to evaluate for possible occult AVN versus SLAP tear.  I think SLAP tear is most likely.  His rotator cuff strength is good and he is not really having any instability symptoms.  Follow-up after that study.  Follow-Up Instructions: No follow-ups on file.   Orders:  Orders Placed This Encounter  Procedures   XR Shoulder Right   MR SHOULDER RIGHT W CONTRAST   Arthrogram   No orders of the defined types were placed in this encounter.     Procedures: No procedures performed   Clinical Data: No additional findings.  Objective: Vital Signs: There were no vitals taken for this visit.  Physical Exam:  Constitutional: Patient appears well-developed HEENT:  Head: Normocephalic Eyes:EOM are normal Neck: Normal range of motion Cardiovascular: Normal rate Pulmonary/chest: Effort normal Neurologic: Patient is alert Skin: Skin is warm Psychiatric: Patient has normal mood and affect  Ortho Exam: Ortho exam demonstrates a little bit of crepitus with internal/external rotation of that right shoulder at 90 degrees of abduction.  O'Brien's testing is equivocal on the right negative on the left.  No discrete AC joint tenderness.  Rotator cuff strength is excellent infraspinatus supraspinatus and subscap muscle testing.  Shoulder range of motion passively is 65/105/165.  Negative  apprehension relocation testing on the right.  Specialty Comments:  No specialty comments available.  Imaging: XR Shoulder Right Result Date: 06/12/2024 AP outlet axillary lateral radiographs right shoulder reviewed.  No acute fracture.  Shoulder is located.  Acromiohumeral distance is normal.  No significant degenerative changes in the glenohumeral or AC joint.  Visualized lung fields clear     PMFS History: Patient Active Problem List   Diagnosis Date Noted   OSA (obstructive sleep apnea)    Status post left hip replacement 04/27/2023   Status post  total replacement of right hip 12/29/2022   Avascular necrosis of bone of left hip (HCC) 10/11/2022   Dyslipidemia 02/25/2020   ADHD, 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   Adjustment disorder with depressed mood 02/25/2020   Nutritional anemia 02/25/2020   Fatty liver 09/02/2016   History of panic attacks 08/30/2016   GAD (generalized anxiety disorder) 08/30/2016   Gastroesophageal reflux disease without esophagitis    High blood triglycerides    Pancreatitis 04/07/2016   Abnormal LFTs 04/06/2016   Alcohol use 04/06/2016   Cough 04/06/2016   Pancreatitis, acute 04/06/2016   Panic attacks    GERD (gastroesophageal reflux disease)    Past Medical History:  Diagnosis Date   Acute pancreatitis hospitalized 04/06/2016   Acute sinusitis    ADD (attention deficit disorder)    Anxiety    Arthritis    Avascular necrosis of bone of hip (HCC)    Blood in stool    Bronchitis    Chronic lower back pain    Depression    Dyslipidemia    Family history of colon cancer    Fatty liver    GERD (gastroesophageal reflux disease)    Headache    @ least once/week (04/07/2016)   Hypertriglyceridemia    Hypertriglyceridemia    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    Migraine    q other month (04/07/2016)   Nasal congestion    Palpitations    Pancreatitis    Panic attacks    Pneumonia ~ 1981   Sleep apnea    Snoring     Family History  Problem Relation Age of Onset   Hypothyroidism Mother    Heart disease Mother    Diabetes Mother    ALS Father     Past Surgical History:  Procedure Laterality Date   TONSILLECTOMY AND ADENOIDECTOMY  ~ 1990   TOTAL HIP ARTHROPLASTY Right 12/29/2022   Procedure: RIGHT TOTAL HIP ARTHROPLASTY ANTERIOR APPROACH;  Surgeon: Vernetta Lonni GRADE, MD;  Location: [facility];  Service: Orthopedics;  Laterality: Right;   TOTAL HIP ARTHROPLASTY Left 04/27/2023   Procedure: LEFT TOTAL HIP ARTHROPLASTY ANTERIOR APPROACH;  Surgeon: Vernetta Lonni GRADE,  MD;  Location: [facility];  Service: Orthopedics;  Laterality: Left;   Social History   Occupational History    Comment: Merchandiser, retail  Tobacco Use   Smoking status: Never   Smokeless tobacco: Never  Vaping Use   Vaping status: Never Used  Substance and Sexual Activity   Alcohol use: Yes    Alcohol/week: 10.0 standard drinks of alcohol    Types: 10 Shots of liquor per week    Comment: social   Drug use: No   Sexual activity: Yes

## 2024-06-17 ENCOUNTER — Other Ambulatory Visit (HOSPITAL_BASED_OUTPATIENT_CLINIC_OR_DEPARTMENT_OTHER): Payer: Self-pay

## 2024-06-18 ENCOUNTER — Other Ambulatory Visit (HOSPITAL_BASED_OUTPATIENT_CLINIC_OR_DEPARTMENT_OTHER): Payer: Self-pay

## 2024-06-19 ENCOUNTER — Other Ambulatory Visit (HOSPITAL_BASED_OUTPATIENT_CLINIC_OR_DEPARTMENT_OTHER): Payer: Self-pay

## 2024-06-19 DIAGNOSIS — F900 Attention-deficit hyperactivity disorder, predominantly inattentive type: Secondary | ICD-10-CM | POA: Diagnosis not present

## 2024-06-19 DIAGNOSIS — I10 Essential (primary) hypertension: Secondary | ICD-10-CM | POA: Diagnosis not present

## 2024-06-19 DIAGNOSIS — M25562 Pain in left knee: Secondary | ICD-10-CM | POA: Diagnosis not present

## 2024-06-19 DIAGNOSIS — A630 Anogenital (venereal) warts: Secondary | ICD-10-CM | POA: Diagnosis not present

## 2024-06-19 DIAGNOSIS — M879 Osteonecrosis, unspecified: Secondary | ICD-10-CM | POA: Diagnosis not present

## 2024-06-19 DIAGNOSIS — K760 Fatty (change of) liver, not elsewhere classified: Secondary | ICD-10-CM | POA: Diagnosis not present

## 2024-06-19 DIAGNOSIS — M5442 Lumbago with sciatica, left side: Secondary | ICD-10-CM | POA: Diagnosis not present

## 2024-06-19 DIAGNOSIS — R739 Hyperglycemia, unspecified: Secondary | ICD-10-CM | POA: Diagnosis not present

## 2024-06-19 DIAGNOSIS — K219 Gastro-esophageal reflux disease without esophagitis: Secondary | ICD-10-CM | POA: Diagnosis not present

## 2024-06-19 DIAGNOSIS — E039 Hypothyroidism, unspecified: Secondary | ICD-10-CM | POA: Diagnosis not present

## 2024-06-19 MED ORDER — ALPRAZOLAM 0.5 MG PO TABS
0.5000 mg | ORAL_TABLET | Freq: Two times a day (BID) | ORAL | 2 refills | Status: AC | PRN
  Filled 2024-06-19: qty 40, 20d supply, fill #0
  Filled 2024-08-14: qty 40, 20d supply, fill #1
  Filled 2024-09-23: qty 40, 20d supply, fill #2

## 2024-06-19 MED ORDER — FENOFIBRATE 160 MG PO TABS
160.0000 mg | ORAL_TABLET | Freq: Every day | ORAL | 1 refills | Status: AC
  Filled 2024-06-19: qty 90, 90d supply, fill #0
  Filled 2024-08-22: qty 90, 90d supply, fill #1

## 2024-06-19 MED ORDER — OXYCODONE-ACETAMINOPHEN 7.5-325 MG PO TABS
1.0000 | ORAL_TABLET | Freq: Two times a day (BID) | ORAL | 0 refills | Status: DC | PRN
  Filled 2024-06-19: qty 60, 30d supply, fill #0

## 2024-06-19 MED ORDER — ESOMEPRAZOLE MAGNESIUM 40 MG PO CPDR
40.0000 mg | DELAYED_RELEASE_CAPSULE | Freq: Every day | ORAL | 1 refills | Status: AC
  Filled 2024-06-19: qty 90, 90d supply, fill #0
  Filled 2024-08-22: qty 90, 90d supply, fill #1

## 2024-06-19 MED ORDER — OMEGA-3-ACID ETHYL ESTERS 1 G PO CAPS
2.0000 g | ORAL_CAPSULE | Freq: Two times a day (BID) | ORAL | 1 refills | Status: AC
  Filled 2024-06-19 – 2024-08-22 (×2): qty 360, 90d supply, fill #0

## 2024-06-19 MED ORDER — AMPHETAMINE-DEXTROAMPHETAMINE 20 MG PO TABS
20.0000 mg | ORAL_TABLET | Freq: Three times a day (TID) | ORAL | 0 refills | Status: AC
  Filled 2024-06-19 – 2024-06-23 (×2): qty 90, 30d supply, fill #0
  Filled 2024-06-23: qty 14, 5d supply, fill #0
  Filled 2024-07-04: qty 76, 26d supply, fill #1

## 2024-06-20 ENCOUNTER — Other Ambulatory Visit (HOSPITAL_BASED_OUTPATIENT_CLINIC_OR_DEPARTMENT_OTHER): Payer: Self-pay

## 2024-06-20 MED ORDER — DULCOLAX 5 MG PO TBEC
DELAYED_RELEASE_TABLET | ORAL | 0 refills | Status: DC
  Filled 2024-06-20: qty 4, 1d supply, fill #0

## 2024-06-20 MED ORDER — PEG 3350-KCL-NA BICARB-NACL 420 G PO SOLR
ORAL | 0 refills | Status: DC
  Filled 2024-06-20: qty 4000, 1d supply, fill #0

## 2024-06-23 ENCOUNTER — Other Ambulatory Visit (HOSPITAL_BASED_OUTPATIENT_CLINIC_OR_DEPARTMENT_OTHER): Payer: Self-pay

## 2024-06-24 ENCOUNTER — Ambulatory Visit
Admission: RE | Admit: 2024-06-24 | Discharge: 2024-06-24 | Disposition: A | Discharge status: Home / Self Care | Source: Ambulatory Visit | Attending: Orthopedic Surgery | Admitting: Orthopedic Surgery

## 2024-06-24 ENCOUNTER — Ambulatory Visit
Admission: RE | Admit: 2024-06-24 | Discharge: 2024-06-24 | Disposition: A | Source: Ambulatory Visit | Attending: Orthopedic Surgery | Admitting: Orthopedic Surgery

## 2024-06-24 DIAGNOSIS — M19011 Primary osteoarthritis, right shoulder: Secondary | ICD-10-CM | POA: Diagnosis not present

## 2024-06-24 DIAGNOSIS — M25511 Pain in right shoulder: Secondary | ICD-10-CM

## 2024-06-24 MED ORDER — IOPAMIDOL (ISOVUE-M 200) INJECTION 41%
10.0000 mL | Freq: Once | INTRAMUSCULAR | Status: AC
  Administered 2024-06-24: 10 mL via INTRA_ARTICULAR

## 2024-07-01 ENCOUNTER — Other Ambulatory Visit (HOSPITAL_BASED_OUTPATIENT_CLINIC_OR_DEPARTMENT_OTHER): Payer: Self-pay

## 2024-07-04 ENCOUNTER — Other Ambulatory Visit (HOSPITAL_BASED_OUTPATIENT_CLINIC_OR_DEPARTMENT_OTHER): Payer: Self-pay

## 2024-07-08 ENCOUNTER — Other Ambulatory Visit (HOSPITAL_BASED_OUTPATIENT_CLINIC_OR_DEPARTMENT_OTHER): Payer: Self-pay

## 2024-07-09 ENCOUNTER — Ambulatory Visit: Admitting: Orthopedic Surgery

## 2024-07-10 ENCOUNTER — Encounter: Payer: Self-pay | Admitting: Orthopedic Surgery

## 2024-07-10 ENCOUNTER — Other Ambulatory Visit (HOSPITAL_BASED_OUTPATIENT_CLINIC_OR_DEPARTMENT_OTHER): Payer: Self-pay

## 2024-07-10 MED ORDER — AMPHETAMINE-DEXTROAMPHETAMINE 15 MG PO TABS
ORAL_TABLET | ORAL | 0 refills | Status: DC
  Filled 2024-07-10: qty 120, 30d supply, fill #0

## 2024-07-10 NOTE — Telephone Encounter (Signed)
 Hi [name].  I called Connor Martin.  His shoulder scan looks good.  His shoulder symptoms are about the same.  I think he wants to come in before the end of the year to get an injection.  Intra-articular injection makes the most sense.  Clavicle not really symptomatic on last exam can you call him sometime next week to set that up.  I think he is going to go out of town to play golf on Thursday.  Thanks.

## 2024-07-11 ENCOUNTER — Other Ambulatory Visit (HOSPITAL_BASED_OUTPATIENT_CLINIC_OR_DEPARTMENT_OTHER): Payer: Self-pay

## 2024-07-15 DIAGNOSIS — Z860101 Personal history of adenomatous and serrated colon polyps: Secondary | ICD-10-CM | POA: Diagnosis not present

## 2024-07-15 DIAGNOSIS — D125 Benign neoplasm of sigmoid colon: Secondary | ICD-10-CM | POA: Diagnosis not present

## 2024-07-15 DIAGNOSIS — K648 Other hemorrhoids: Secondary | ICD-10-CM | POA: Diagnosis not present

## 2024-07-15 DIAGNOSIS — K5730 Diverticulosis of large intestine without perforation or abscess without bleeding: Secondary | ICD-10-CM | POA: Diagnosis not present

## 2024-07-15 DIAGNOSIS — Z09 Encounter for follow-up examination after completed treatment for conditions other than malignant neoplasm: Secondary | ICD-10-CM | POA: Diagnosis not present

## 2024-07-22 ENCOUNTER — Other Ambulatory Visit: Payer: Self-pay

## 2024-07-23 ENCOUNTER — Other Ambulatory Visit (HOSPITAL_BASED_OUTPATIENT_CLINIC_OR_DEPARTMENT_OTHER): Payer: Self-pay

## 2024-07-23 DIAGNOSIS — H52223 Regular astigmatism, bilateral: Secondary | ICD-10-CM | POA: Diagnosis not present

## 2024-07-23 DIAGNOSIS — H5213 Myopia, bilateral: Secondary | ICD-10-CM | POA: Diagnosis not present

## 2024-07-24 ENCOUNTER — Other Ambulatory Visit (HOSPITAL_BASED_OUTPATIENT_CLINIC_OR_DEPARTMENT_OTHER): Payer: Self-pay

## 2024-07-24 MED ORDER — OXYCODONE-ACETAMINOPHEN 7.5-325 MG PO TABS
1.0000 | ORAL_TABLET | Freq: Two times a day (BID) | ORAL | 0 refills | Status: DC | PRN
  Filled 2024-07-24: qty 60, 30d supply, fill #0

## 2024-07-25 DIAGNOSIS — Z6829 Body mass index (BMI) 29.0-29.9, adult: Secondary | ICD-10-CM | POA: Diagnosis not present

## 2024-07-25 DIAGNOSIS — M5136 Other intervertebral disc degeneration, lumbar region with discogenic back pain only: Secondary | ICD-10-CM | POA: Diagnosis not present

## 2024-07-25 DIAGNOSIS — M2559 Pain in other specified joint: Secondary | ICD-10-CM | POA: Diagnosis not present

## 2024-07-25 DIAGNOSIS — Z111 Encounter for screening for respiratory tuberculosis: Secondary | ICD-10-CM | POA: Diagnosis not present

## 2024-07-25 DIAGNOSIS — E663 Overweight: Secondary | ICD-10-CM | POA: Diagnosis not present

## 2024-07-25 DIAGNOSIS — R5383 Other fatigue: Secondary | ICD-10-CM | POA: Diagnosis not present

## 2024-07-25 DIAGNOSIS — L409 Psoriasis, unspecified: Secondary | ICD-10-CM | POA: Diagnosis not present

## 2024-07-28 ENCOUNTER — Encounter: Payer: Self-pay | Admitting: Radiology

## 2024-07-30 ENCOUNTER — Ambulatory Visit: Admitting: Orthopedic Surgery

## 2024-08-13 DIAGNOSIS — M5136 Other intervertebral disc degeneration, lumbar region with discogenic back pain only: Secondary | ICD-10-CM | POA: Diagnosis not present

## 2024-08-13 DIAGNOSIS — Z6828 Body mass index (BMI) 28.0-28.9, adult: Secondary | ICD-10-CM | POA: Diagnosis not present

## 2024-08-13 DIAGNOSIS — E663 Overweight: Secondary | ICD-10-CM | POA: Diagnosis not present

## 2024-08-13 DIAGNOSIS — Z111 Encounter for screening for respiratory tuberculosis: Secondary | ICD-10-CM | POA: Diagnosis not present

## 2024-08-13 DIAGNOSIS — L4059 Other psoriatic arthropathy: Secondary | ICD-10-CM | POA: Diagnosis not present

## 2024-08-13 DIAGNOSIS — M2559 Pain in other specified joint: Secondary | ICD-10-CM | POA: Diagnosis not present

## 2024-08-13 DIAGNOSIS — L409 Psoriasis, unspecified: Secondary | ICD-10-CM | POA: Diagnosis not present

## 2024-08-13 DIAGNOSIS — R5383 Other fatigue: Secondary | ICD-10-CM | POA: Diagnosis not present

## 2024-08-14 ENCOUNTER — Other Ambulatory Visit: Payer: Self-pay

## 2024-08-14 ENCOUNTER — Ambulatory Visit (INDEPENDENT_AMBULATORY_CARE_PROVIDER_SITE_OTHER): Admitting: Orthopedic Surgery

## 2024-08-14 ENCOUNTER — Other Ambulatory Visit (HOSPITAL_BASED_OUTPATIENT_CLINIC_OR_DEPARTMENT_OTHER): Payer: Self-pay

## 2024-08-14 DIAGNOSIS — M65911 Unspecified synovitis and tenosynovitis, right shoulder: Secondary | ICD-10-CM | POA: Diagnosis not present

## 2024-08-14 DIAGNOSIS — M25511 Pain in right shoulder: Secondary | ICD-10-CM

## 2024-08-15 ENCOUNTER — Other Ambulatory Visit: Payer: Self-pay

## 2024-08-15 ENCOUNTER — Encounter: Payer: Self-pay | Admitting: Orthopedic Surgery

## 2024-08-15 MED ORDER — BUPIVACAINE HCL 0.5 % IJ SOLN
9.0000 mL | INTRAMUSCULAR | Status: AC | PRN
  Administered 2024-08-14: 9 mL via INTRA_ARTICULAR

## 2024-08-15 MED ORDER — LIDOCAINE HCL 1 % IJ SOLN
5.0000 mL | INTRAMUSCULAR | Status: AC | PRN
  Administered 2024-08-14: 5 mL

## 2024-08-15 MED ORDER — TRIAMCINOLONE ACETONIDE 40 MG/ML IJ SUSP
40.0000 mg | INTRAMUSCULAR | Status: AC | PRN
  Administered 2024-08-14: 40 mg via INTRA_ARTICULAR

## 2024-08-15 NOTE — Progress Notes (Signed)
 Office Visit Note   Patient: Connor Martin           Date of Birth: 1976/03/06           MRN: [id number] Visit Date: 08/14/2024 Requested by: Sarita Hutchinson, MD No address on file PCP: Sarita Hutchinson, MD  Subjective: Chief Complaint  Patient presents with   Right Shoulder - Follow-up    REVIEW MRI    HPI: Connor Martin is a 48 y.o. male who presents to the office reporting continued right shoulder pain.  Since he was last seen has had an MRI scan.  He has an MRI arthrogram.  Pain localizes to the anterior superior aspect of the shoulder.  Does not have pain on a daily basis.  Since he was last seen he has been diagnosed with psoriatic arthritis affecting the knees as well as other areas.  Been recommended to take a biologic which he is considering.  Does have a history of having dislocated the right shoulder about a year ago which actually started his hip problems.  MRI scan is reviewed.  Shows mild insertional tendinopathy of the posterior superior cuff and mild to moderate AC joint arthritis.  The labrum itself looks intact..                ROS: All systems reviewed are negative as they relate to the chief complaint within the history of present illness.  Patient denies fevers or chills.  Assessment & Plan: Visit Diagnoses:  1. Right shoulder pain, unspecified chronicity     Plan: Impression is right shoulder pain with not a compelling physical exam for [facility] joint arthritis.  He does have a little bit of coarseness with labral load testing.  MRI arthrogram not 100% accurate for detecting labral pathology.  I think based on his history of dislocation as well as exam findings that that may still be the most likely culprit for his symptoms with the second most likely diagnosis being [facility] joint arthritis.  Plan at this time is glenohumeral joint injection under ultrasound.  6-week return for clinical recheck and decision was for or against any further intervention which in his case would  be potential arthroscopic evaluation and labral evaluation.  He does not really report any shoulder instability symptoms.  Follow-Up Instructions: No follow-ups on file.   Orders:  Orders Placed This Encounter  Procedures   US  Guided Needle Placement - No Linked Charges   No orders of the defined types were placed in this encounter.     Procedures: Large Joint Inj: R glenohumeral on 08/14/2024 7:30 AM Indications: diagnostic evaluation and pain Details: 22 G 3.5 in needle, ultrasound-guided posterior approach  Arthrogram: No  Medications: 9 mL bupivacaine  0.5 %; 5 mL lidocaine  1 %; 40 mg triamcinolone  acetonide 40 MG/ML Outcome: tolerated well, no immediate complications Procedure, treatment alternatives, risks and benefits explained, specific risks discussed. Consent was given by the patient. Immediately prior to procedure a time out was called to verify the correct patient, procedure, equipment, support staff and site/side marked as required. Patient was prepped and draped in the usual sterile fashion.       Clinical Data: No additional findings.  Objective: Vital Signs: There were no vitals taken for this visit.  Physical Exam:  Constitutional: Patient appears well-developed HEENT:  Head: Normocephalic Eyes:EOM are normal Neck: Normal range of motion Cardiovascular: Normal rate Pulmonary/chest: Effort normal Neurologic: Patient is alert Skin: Skin is warm Psychiatric: Patient has normal mood and affect  Ortho Exam: Ortho exam demonstrates not too much asymmetric tenderness in the [facility] joint right versus left.  Cervical spine range of motion is full.  Motor or sensory function of both arms and hands intact.  Range of motion bilateral shoulders is 70/100/170.  Does have a little bit of coarseness with labral load testing at 90 degrees of abduction.  This is on the right-hand side.  Rotator cuff strength is intact infraspinatus supraspinatus and subscap muscle testing  bilaterally.  O'Brien's testing is negative on the left equivocal on the right.     Specialty Comments:  No specialty comments available.  Imaging: US  Guided Needle Placement - No Linked Charges Result Date: 08/15/2024 Ultrasound imaging demonstrates needle placement into the glenohumeral joint with injection of fluid into the joint and no complicating features.     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   Adjustment disorder with depressed mood 02/25/2020   Nutritional anemia 02/25/2020   Fatty liver 09/02/2016   History of panic attacks 08/30/2016   GAD (generalized anxiety disorder) 08/30/2016   Gastroesophageal reflux disease without esophagitis    High blood triglycerides    Pancreatitis 04/07/2016   Abnormal LFTs 04/06/2016   Alcohol use 04/06/2016   Cough 04/06/2016   Pancreatitis, acute 04/06/2016   Panic attacks    GERD (gastroesophageal reflux disease)    Past Medical History:  Diagnosis Date   Acute pancreatitis hospitalized 04/06/2016   Acute sinusitis    ADD (attention deficit disorder)    Anxiety    Arthritis    Avascular necrosis of bone of hip (HCC)    Blood in stool    Bronchitis    Chronic lower back pain    Depression    Dyslipidemia    Family history of colon cancer    Fatty liver    GERD (gastroesophageal reflux disease)    Headache    @ least once/week (04/07/2016)   Hypertriglyceridemia    Hypertriglyceridemia    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    Migraine    q other month (04/07/2016)   Nasal congestion    Palpitations    Pancreatitis    Panic attacks    Pneumonia ~ 1981   Sleep apnea    Snoring     Family History  Problem  Relation Age of Onset   Hypothyroidism Mother    Heart disease Mother    Diabetes Mother    ALS Father     Past Surgical History:  Procedure Laterality Date   TONSILLECTOMY AND ADENOIDECTOMY  ~ 1990   TOTAL HIP ARTHROPLASTY Right 12/29/2022   Procedure: RIGHT TOTAL HIP ARTHROPLASTY ANTERIOR APPROACH;  Surgeon: Vernetta Lonni GRADE, MD;  Location: [facility];  Service: Orthopedics;  Laterality: Right;   TOTAL HIP ARTHROPLASTY Left 04/27/2023   Procedure: LEFT TOTAL HIP ARTHROPLASTY ANTERIOR APPROACH;  Surgeon: Vernetta Lonni GRADE, MD;  Location: [facility];  Service: Orthopedics;  Laterality: Left;   Social History   Occupational History    Comment: merchandiser, retail  Tobacco Use   Smoking status: Never   Smokeless tobacco: Never  Vaping Use   Vaping status: Never Used  Substance and Sexual Activity   Alcohol use:  Yes    Alcohol/week: 10.0 standard drinks of alcohol    Types: 10 Shots of liquor per week    Comment: social   Drug use: No   Sexual activity: Yes

## 2024-08-19 ENCOUNTER — Other Ambulatory Visit (HOSPITAL_BASED_OUTPATIENT_CLINIC_OR_DEPARTMENT_OTHER): Payer: Self-pay

## 2024-08-19 MED ORDER — AMPHETAMINE-DEXTROAMPHETAMINE 15 MG PO TABS
ORAL_TABLET | ORAL | 0 refills | Status: DC
  Filled 2024-08-19: qty 120, 30d supply, fill #0

## 2024-08-19 MED ORDER — OXYCODONE-ACETAMINOPHEN 7.5-325 MG PO TABS
1.0000 | ORAL_TABLET | Freq: Two times a day (BID) | ORAL | 0 refills | Status: DC | PRN
  Filled 2024-08-19: qty 60, 30d supply, fill #0

## 2024-08-20 ENCOUNTER — Other Ambulatory Visit (HOSPITAL_BASED_OUTPATIENT_CLINIC_OR_DEPARTMENT_OTHER): Payer: Self-pay

## 2024-08-20 ENCOUNTER — Other Ambulatory Visit: Payer: Self-pay

## 2024-08-22 ENCOUNTER — Other Ambulatory Visit: Payer: Self-pay

## 2024-08-22 ENCOUNTER — Other Ambulatory Visit (HOSPITAL_BASED_OUTPATIENT_CLINIC_OR_DEPARTMENT_OTHER): Payer: Self-pay

## 2024-08-25 ENCOUNTER — Other Ambulatory Visit (HOSPITAL_COMMUNITY): Payer: Self-pay

## 2024-08-25 ENCOUNTER — Other Ambulatory Visit (HOSPITAL_BASED_OUTPATIENT_CLINIC_OR_DEPARTMENT_OTHER): Payer: Self-pay

## 2024-08-26 ENCOUNTER — Other Ambulatory Visit: Payer: Self-pay

## 2024-08-26 ENCOUNTER — Encounter (HOSPITAL_COMMUNITY): Payer: Self-pay

## 2024-08-26 ENCOUNTER — Other Ambulatory Visit (HOSPITAL_COMMUNITY): Payer: Self-pay

## 2024-08-26 MED ORDER — OTEZLA 30 MG PO TABS: 30.0000 mg | ORAL_TABLET | Freq: Two times a day (BID) | ORAL | 5 refills | Status: AC

## 2024-08-26 NOTE — Progress Notes (Signed)
 Patient to be enrolled with [facility]. Routed to Tiffany.

## 2024-08-26 NOTE — Progress Notes (Signed)
 Pharmacy Patient Advocate Encounter  Insurance verification completed.   The patient is insured through [employer]   Ran test claim for [employer]. Currently a quantity of 60 is a 30 day supply and the co-pay is $0. Patient has copay card.   This test claim was processed through [facility]- copay amounts may vary at other pharmacies due to pharmacy/plan contracts, or as the patient moves through the different stages of their insurance plan.

## 2024-08-27 ENCOUNTER — Other Ambulatory Visit: Payer: Self-pay | Admitting: Pharmacist

## 2024-08-27 NOTE — Progress Notes (Signed)
 Called patient to schedule an appointment for the [facility]. I was unable to reach the patient so I left a HIPAA-compliant message requesting that the patient return my call.   Herlene Fleeta Morris, PharmD, JAQUELINE, CPP Clinical Pharmacist [facility] [phone]

## 2024-08-28 ENCOUNTER — Other Ambulatory Visit: Payer: Self-pay

## 2024-09-01 ENCOUNTER — Other Ambulatory Visit: Payer: Self-pay

## 2024-09-01 ENCOUNTER — Other Ambulatory Visit (HOSPITAL_BASED_OUTPATIENT_CLINIC_OR_DEPARTMENT_OTHER): Payer: Self-pay

## 2024-09-01 MED ORDER — OTEZLA 30 MG PO TABS: ORAL_TABLET | ORAL | 5 refills | Status: AC

## 2024-09-03 ENCOUNTER — Other Ambulatory Visit: Payer: Self-pay

## 2024-09-03 NOTE — Progress Notes (Signed)
 06/26/23- 47 yoM never smoker for sleep evaluation for OSA, courtesy of Dr Sarita who was his previous physician while living in [place]. Epworth score 1 HST 10/18/22- AHI 50.4/hr, Desat to 78%, body weight 218 lbs Hx loud snoring, unrested. 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 ENT surgery for T&A. Hx nasal fx. Remote viral pancreatitis. Remote sleep study around 2007- results unavailable, not treated then.  Prior to Admission medications   Medication Sig Start Date End Date Taking? Authorizing Provider  ALPRAZolam  (XANAX ) 0.5 MG tablet Take 1 tablet (0.5 mg total) by mouth 2 (two) times daily as needed. 02/01/23  Yes   amphetamine -dextroamphetamine  (ADDERALL) 20 MG tablet Take 1 tablet (20 mg total) by mouth 3 (three) times daily. 06/11/23  Yes   betamethasone  dipropionate (DIPROLENE ) 0.05 % ointment Apply a thin layer to affected area(s) twice daily. 01/22/23  Yes   Biotin  1 MG CAPS Take 1 mg by mouth daily at 12 noon.   Yes [provider]  esomeprazole  (NEXIUM ) 40 MG capsule Take 1 capsule (40 mg total) by mouth daily. 04/24/23  Yes   fenofibrate  160 MG tablet Take 1 tablet (160 mg total) by mouth daily. 04/24/23  Yes   gabapentin  (NEURONTIN ) 100 MG capsule Take 1 capsule (100 mg total) by mouth 3 (three) times daily as needed. 04/28/23 04/27/24 Yes Vernetta Lonni GRADE, MD  imiquimod  (ALDARA ) 5 % cream Apply topically 5 nights per week, use occlusive dressing, wash off after 8-10 hours. 08/21/22  Yes   levocetirizine (XYZAL ) 5 MG tablet Take 1 tablet (5 mg total) by mouth every evening. 12/06/22  Yes   magnesium  oxide (MAG-OX) 400 MG tablet Take 400 mg by mouth daily.   Yes [provider]  Milk Thistle 200 MG CAPS Take 200 mg by mouth daily.   Yes [provider]  Multiple Vitamin (MULTIVITAMIN WITH MINERALS) TABS tablet Take 1 tablet by mouth daily. 04/10/16  Yes Ricky Fines, MD  omega-3 acid ethyl esters  (LOVAZA ) 1 g capsule take 2 capsule by oral route 2 times every day 02/28/22  Yes   Rimegepant Sulfate  (NURTEC) 75 MG TBDP Take 75 mg by mouth as needed. 04/17/22  Yes Rush Nest, MD  tiZANidine  (ZANAFLEX ) 4 MG tablet Take 1 tablet (4 mg total) by mouth every 6 (six) hours as needed for muscle spasms. 05/10/23  Yes Gretta Bertrum ORN, PA-C  vitamin E  1000 UNIT capsule Take 1,000 Units by mouth daily.   Yes [provider]  Zinc  Gluconate 100 MG TABS Take 100 mg by mouth daily.   Yes [provider]  ALPRAZolam  (XANAX ) 0.5 MG tablet Take 1 tablet (0.5 mg total) by mouth 2 (two) times daily as needed. 06/27/23     amphetamine -dextroamphetamine  (ADDERALL) 20 MG tablet Take 1 tablet (20 mg total) by mouth 3 (three) times daily. 06/27/23     esomeprazole  (NEXIUM ) 40 MG capsule Take 1 capsule (40 mg total) by mouth daily. 06/27/23     fenofibrate  160 MG tablet TAKE 1 TABLET BY MOUTH EVERY DAY 06/27/23     levocetirizine (XYZAL ) 5 MG tablet Take 1 tablet (5 mg total) by mouth every evening. 06/27/23   Sarita Hutchinson, MD  omega-3 acid ethyl esters (LOVAZA ) 1 g capsule Take 2 capsules by mouth 2 times daily. 06/27/23     oxyCODONE -acetaminophen  (PERCOCET) 7.5-325 MG tablet Take 1 tablet by mouth every 12 (twelve) hours as needed. 06/27/23      Past  Medical History:  Diagnosis Date   Acute pancreatitis hospitalized 04/06/2016   Acute sinusitis    ADD (attention deficit disorder)    Anxiety    Arthritis    Avascular necrosis of bone of hip (HCC)    Blood in stool    Bronchitis    Chronic lower back pain    Depression    Dyslipidemia    Family history of colon cancer    Fatty liver    GERD (gastroesophageal reflux disease)    Headache    @ least once/week (04/07/2016)   Hypertriglyceridemia    Hypertriglyceridemia    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    Migraine    q other month (04/07/2016)   Nasal congestion    Palpitations    Pancreatitis    Panic attacks    Pneumonia ~  1981   Sleep apnea    Snoring    Past Surgical History:  Procedure Laterality Date   TONSILLECTOMY AND ADENOIDECTOMY  ~ 1990   TOTAL HIP ARTHROPLASTY Right 12/29/2022   Procedure: RIGHT TOTAL HIP ARTHROPLASTY ANTERIOR APPROACH;  Surgeon: Vernetta Lonni GRADE, MD;  Location: [facility];  Service: Orthopedics;  Laterality: Right;   TOTAL HIP ARTHROPLASTY Left 04/27/2023   Procedure: LEFT TOTAL HIP ARTHROPLASTY ANTERIOR APPROACH;  Surgeon: Vernetta Lonni GRADE, MD;  Location: [facility];  Service: Orthopedics;  Laterality: Left;   Family History  Problem Relation Age of Onset   Hypothyroidism Mother    Heart disease Mother    Diabetes Mother    ALS Father    Social History   Socioeconomic History   Marital status: Legally Separated    Spouse name: Miranda   Number of children: 1   Years of education: Not on file   Highest education level: Bachelor's degree (e.g., BA, AB, BS)  Occupational History    Comment: merchandiser, retail  Tobacco Use   Smoking status: Never   Smokeless tobacco: Never  Vaping Use   Vaping status: Never Used  Substance and Sexual Activity   Alcohol use: Yes    Alcohol/week: 10.0 standard drinks of alcohol    Types: 10 Shots of liquor per week    Comment: social   Drug use: No   Sexual activity: Yes  Other Topics Concern   Not on file  Social History Narrative   Lives with wife   Caffeine- very little   Social Determinants of Health   Financial Resource Strain: Not on file  Food Insecurity: No Food Insecurity (04/27/2023)   Hunger Vital Sign    Worried About Running Out of Food in the Last Year: Never true    Ran Out of Food in the Last Year: Never true  Transportation Needs: No Transportation Needs (04/27/2023)   PRAPARE - Administrator, Civil Service (Medical): No    Lack of Transportation (Non-Medical): No  Physical Activity: Not on file  Stress: Not on file  Social Connections: Not on file  Intimate Partner Violence: Not At Risk (04/27/2023)    Humiliation, Afraid, Rape, and Kick questionnaire    Fear of Current or Ex-Partner: No    Emotionally Abused: No    Physically Abused: No    Sexually Abused: No   09/04/24- 48 yoM never smoker followed for OSA, 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 ENT surgery for T&A. Hx nasal fx. HST 10/18/22- AHI 50.4/hr, Desat to 78%, body weight 218 lbs CPAP auto 5-20 / [facility] new order  06/26/23 Download compliance-17%, AHI 0.2/hr Body weight today-233 lbs   not obese Not meeting compliance goals. Says he wakes wrestling with the hose/ restless at night, pulls on his mask. Discussed alternatives. He would rather explore an oral appliance over Inspire at this time.  ROS-see HPI   + = positive Constitutional:    weight loss, night sweats, fevers, chills, fatigue, lassitude. HEENT:    headaches, difficulty swallowing, tooth/dental problems, sore throat, sneezing, itching, ear ache, nasal congestion, post nasal drip, snoring CV:    chest pain, orthopnea, PND, swelling in lower extremities, anasarca,  dizziness, palpitations Resp:   shortness of breath with exertion or at rest.                productive cough,   non-productive cough, coughing up of blood.              change in color of mucus.  wheezing.   Skin:    rash or lesions. GI:  No-   heartburn, indigestion, abdominal pain, nausea, vomiting, diarrhea,                 change in bowel habits, loss of appetite GU: dysuria, change in color of urine, no urgency or frequency.   flank pain. MS:   joint pain, stiffness, decreased range of motion, back pain. Neuro-     nothing unusual Psych:  change in mood or affect.  depression or anxiety.   memory loss.   OBJ- Physical Exam General- Alert, Oriented, Affect-appropriate, Distress- none acute, +not obese Skin- rash-none, lesions- none, excoriation- none Lymphadenopathy- none Head- atraumatic            Eyes- Gross vision intact, PERRLA, conjunctivae and secretions  clear            Ears- Hearing, canals-normal            Nose- Clear, no-Septal dev, mucus, polyps, erosion, perforation             Throat- Mallampati II/ thin posterior , mucosa clear , drainage- none, tonsils- atrophic, +teeth, Neck- flexible , trachea midline, no stridor , thyroid  nl, carotid no bruit Chest - symmetrical excursion , unlabored           Heart/CV- RRR , no murmur , no gallop  , no rub, nl s1 s2                           - JVD- none , edema- none, stasis changes- none, varices- none           Lung- clear to P&A, wheeze- none, cough- none , dullness-none, rub- none           Chest wall-  Abd-  Br/ Gen/ Rectal- Not done, not indicated Extrem- cyanosis- none, clubbing, none, atrophy- none, strength- nl Neuro- grossly intact to observation   .

## 2024-09-04 ENCOUNTER — Encounter: Payer: Self-pay | Admitting: Internal Medicine

## 2024-09-04 ENCOUNTER — Other Ambulatory Visit: Payer: Self-pay

## 2024-09-04 ENCOUNTER — Ambulatory Visit: Admitting: Internal Medicine

## 2024-09-04 VITALS — BP 136/80 | HR 90 | Temp 97.7°F | Ht 74.0 in | Wt 233.2 lb

## 2024-09-04 DIAGNOSIS — Z9989 Dependence on other enabling machines and devices: Secondary | ICD-10-CM

## 2024-09-04 DIAGNOSIS — G4733 Obstructive sleep apnea (adult) (pediatric): Secondary | ICD-10-CM | POA: Diagnosis not present

## 2024-09-04 DIAGNOSIS — K219 Gastro-esophageal reflux disease without esophagitis: Secondary | ICD-10-CM

## 2024-09-04 NOTE — Progress Notes (Signed)
 Patient has not responded to calls or MyChart message. Dis-enrolling.

## 2024-09-04 NOTE — Patient Instructions (Signed)
 Order- DME [facility]- Continue CPAP auto 5-20, mask of choice, humidifier, supplies, AirView/ card   Order- referral to Dr Oneil Forget, Orthodontist. Consider oral appliance for OSA  Please call if we can help

## 2024-09-19 ENCOUNTER — Encounter: Payer: Self-pay | Admitting: Internal Medicine

## 2024-09-19 NOTE — Assessment & Plan Note (Signed)
Reflux precautions emphasized 

## 2024-09-19 NOTE — Assessment & Plan Note (Signed)
 Having trouble getting comfortable with CPAP Plan- refer to explore oral appliance.

## 2024-09-22 ENCOUNTER — Ambulatory Visit: Admitting: Orthopedic Surgery

## 2024-09-23 ENCOUNTER — Other Ambulatory Visit (HOSPITAL_BASED_OUTPATIENT_CLINIC_OR_DEPARTMENT_OTHER): Payer: Self-pay

## 2024-09-23 ENCOUNTER — Other Ambulatory Visit: Payer: Self-pay

## 2024-09-29 ENCOUNTER — Other Ambulatory Visit (HOSPITAL_BASED_OUTPATIENT_CLINIC_OR_DEPARTMENT_OTHER): Payer: Self-pay

## 2024-09-29 MED ORDER — AMPHETAMINE-DEXTROAMPHETAMINE 15 MG PO TABS
ORAL_TABLET | ORAL | 0 refills | Status: AC
  Filled 2024-09-29: qty 120, 30d supply, fill #0

## 2024-09-29 MED ORDER — OXYCODONE-ACETAMINOPHEN 7.5-325 MG PO TABS
1.0000 | ORAL_TABLET | Freq: Two times a day (BID) | ORAL | 0 refills | Status: AC | PRN
  Filled 2024-09-29: qty 60, 30d supply, fill #0

## 2024-10-03 ENCOUNTER — Other Ambulatory Visit (HOSPITAL_BASED_OUTPATIENT_CLINIC_OR_DEPARTMENT_OTHER): Payer: Self-pay

## 2024-10-14 ENCOUNTER — Other Ambulatory Visit (HOSPITAL_COMMUNITY): Payer: Self-pay

## 2024-10-15 ENCOUNTER — Ambulatory Visit (INDEPENDENT_AMBULATORY_CARE_PROVIDER_SITE_OTHER): Payer: Self-pay | Admitting: Orthopedic Surgery

## 2024-10-15 ENCOUNTER — Encounter: Payer: Self-pay | Admitting: Orthopedic Surgery

## 2024-10-15 DIAGNOSIS — M25511 Pain in right shoulder: Secondary | ICD-10-CM

## 2024-10-15 NOTE — Progress Notes (Signed)
 "  Office Visit Note   Patient: Connor Martin           Date of Birth: [birth year]           MRN: [id number] Visit Date: 10/15/2024 Requested by: Sarita Hutchinson, MD No address on file PCP: Sarita Hutchinson, MD  Subjective: Chief Complaint  Patient presents with   Right Shoulder - Follow-up    HPI: Connor Martin is a 49 y.o. male who presents to the office reporting continued right shoulder pain.  Decision point today was for or against arthroscopic intervention.  The glenohumeral joint injection helped his psoriasis but did not help his shoulder pain.  He does report some pain sleeping on that right shoulder at night.  Localizes it to the anterior aspect of the [facility] joint.  Denies any neck pain or radicular symptoms.  He has had an MRI scan which is rereviewed.  The rotator cuff has some tendinosis but nothing actionable.  Labrum looks intact.  Does have some edema in the [facility] joint along with cystic changes..                ROS: All systems reviewed are negative as they relate to the chief complaint within the history of present illness.  Patient denies fevers or chills.  Assessment & Plan: Visit Diagnoses:  1. Right shoulder pain, unspecified chronicity     Plan: Impression is right shoulder pain.  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  Some pain with crossarm adduction and he does report some popping in the shoulder which on my exam feels more like it is coming from the [facility] joint as opposed to the shoulder joint.  Plan is topical Voltaren cream with 4-week return and decision for against AC joint injection.  Subacromial space is the other area that we have injected but his pain is more focal anterior and superior and less in the deltoid.  Not too much bursitis on MRI scan.  I think he is fine to do activity as tolerated.  Shoulder does not look frozen today.  Follow-Up Instructions: No follow-ups on file.   Orders:  No orders of the defined types were placed  in this encounter.  No orders of the defined types were placed in this encounter.     Procedures: No procedures performed   Clinical Data: No additional findings.  Objective: Vital Signs: There were no vitals taken for this visit.  Physical Exam:  Constitutional: Patient appears well-developed HEENT:  Head: Normocephalic Eyes:EOM are normal Neck: Normal range of motion Cardiovascular: Normal rate Pulmonary/chest: Effort normal Neurologic: Patient is alert Skin: Skin is warm Psychiatric: Patient has normal mood and affect  Ortho Exam: Ortho exam demonstrates full active and passive range of motion of both shoulders.  Mild tenderness right AC joint no tenderness left AC joint.  Mild pain with crossarm adduction on the right negative on the left.  No real bicipital groove tenderness today.  No popping or grinding in the glenohumeral joint with passive range of motion.  His range of motion is about 55/95/165 bilaterally.  #1  Specialty Comments:  No specialty comments available.  Imaging: No results found.   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   Adjustment disorder with depressed mood 02/25/2020   Nutritional anemia 02/25/2020   Fatty liver 09/02/2016   History of panic attacks 08/30/2016   GAD (generalized anxiety disorder) 08/30/2016   Gastroesophageal reflux disease without esophagitis    High blood triglycerides    Pancreatitis 04/07/2016   Abnormal LFTs 04/06/2016   Alcohol use 04/06/2016   Cough 04/06/2016   Pancreatitis, acute 04/06/2016   Panic attacks    GERD (gastroesophageal reflux disease)    Past Medical History:  Diagnosis Date   Acute pancreatitis  hospitalized 04/06/2016   Acute sinusitis    ADD (attention deficit disorder)    Anxiety    Arthritis    Avascular necrosis of bone of hip (HCC)    Blood in stool    Bronchitis    Chronic lower back pain    Depression    Dyslipidemia    Family history of colon cancer    Fatty liver    GERD (gastroesophageal reflux disease)    Headache    @ least once/week (04/07/2016)   Hypertriglyceridemia    Hypertriglyceridemia    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    Migraine    q other month (04/07/2016)   Nasal congestion    Palpitations    Pancreatitis    Panic attacks    Pneumonia ~ 1981   Sleep apnea    Snoring     Family History  Problem Relation Age of Onset   Hypothyroidism Mother    Heart disease Mother    Diabetes Mother    ALS Father     Past Surgical History:  Procedure Laterality Date   TONSILLECTOMY AND ADENOIDECTOMY  ~ 1990   TOTAL HIP ARTHROPLASTY Right 12/29/2022   Procedure: RIGHT TOTAL HIP ARTHROPLASTY ANTERIOR APPROACH;  Surgeon: Vernetta Lonni GRADE, MD;  Location: [facility];  Service: Orthopedics;  Laterality: Right;   TOTAL HIP ARTHROPLASTY Left 04/27/2023   Procedure: LEFT TOTAL HIP ARTHROPLASTY ANTERIOR APPROACH;  Surgeon: Vernetta Lonni GRADE, MD;  Location: [facility];  Service: Orthopedics;  Laterality: Left;   Social History   Occupational History    Comment: merchandiser, retail  Tobacco Use   Smoking status: Never   Smokeless tobacco: Never  Vaping Use   Vaping status: Never Used  Substance and Sexual Activity   Alcohol use: Yes    Alcohol/week: 10.0 standard drinks of alcohol    Types: 10 Shots of liquor per week    Comment: social   Drug use: No   Sexual activity: Yes        "

## 2024-10-26 ENCOUNTER — Other Ambulatory Visit: Payer: Self-pay

## 2024-11-17 ENCOUNTER — Ambulatory Visit: Payer: Self-pay | Admitting: Orthopedic Surgery

## 2025-03-10 ENCOUNTER — Ambulatory Visit: Admitting: Pulmonary Disease
